# Patient Record
Sex: Male | Born: 1950 | Race: Black or African American | Hispanic: No | Marital: Married | Smoking: Never smoker
Health system: Southern US, Community
[De-identification: ages and names within clinical notes are randomized; demographics above are authoritative.]

## PROBLEM LIST (undated history)

## (undated) DIAGNOSIS — C801 Malignant (primary) neoplasm, unspecified: Secondary | ICD-10-CM

## (undated) DIAGNOSIS — E119 Type 2 diabetes mellitus without complications: Secondary | ICD-10-CM

## (undated) DIAGNOSIS — E291 Testicular hypofunction: Secondary | ICD-10-CM

## (undated) DIAGNOSIS — I10 Essential (primary) hypertension: Secondary | ICD-10-CM

## (undated) DIAGNOSIS — M109 Gout, unspecified: Secondary | ICD-10-CM

## (undated) DIAGNOSIS — Z9289 Personal history of other medical treatment: Secondary | ICD-10-CM

## (undated) DIAGNOSIS — Z860100 Personal history of colon polyps, unspecified: Secondary | ICD-10-CM

## (undated) DIAGNOSIS — J45909 Unspecified asthma, uncomplicated: Secondary | ICD-10-CM

## (undated) DIAGNOSIS — K5730 Diverticulosis of large intestine without perforation or abscess without bleeding: Secondary | ICD-10-CM

## (undated) DIAGNOSIS — H269 Unspecified cataract: Secondary | ICD-10-CM

## (undated) DIAGNOSIS — E785 Hyperlipidemia, unspecified: Secondary | ICD-10-CM

## (undated) DIAGNOSIS — R351 Nocturia: Secondary | ICD-10-CM

## (undated) DIAGNOSIS — Z8546 Personal history of malignant neoplasm of prostate: Secondary | ICD-10-CM

## (undated) DIAGNOSIS — E782 Mixed hyperlipidemia: Secondary | ICD-10-CM

## (undated) DIAGNOSIS — N529 Male erectile dysfunction, unspecified: Secondary | ICD-10-CM

## (undated) DIAGNOSIS — Z87442 Personal history of urinary calculi: Secondary | ICD-10-CM

## (undated) DIAGNOSIS — T7840XA Allergy, unspecified, initial encounter: Secondary | ICD-10-CM

## (undated) HISTORY — DX: Allergy, unspecified, initial encounter: T78.40XA

## (undated) HISTORY — DX: Testicular hypofunction: E29.1

## (undated) HISTORY — PX: OTHER SURGICAL HISTORY: SHX169

## (undated) HISTORY — DX: Hyperlipidemia, unspecified: E78.5

## (undated) HISTORY — PX: COLONOSCOPY: SHX174

## (undated) HISTORY — DX: Essential (primary) hypertension: I10

## (undated) HISTORY — DX: Unspecified cataract: H26.9

## (undated) HISTORY — PX: PERCUTANEOUS NEPHROLITHOTRIPSY: SHX2206

## (undated) HISTORY — DX: Malignant (primary) neoplasm, unspecified: C80.1

## (undated) HISTORY — PX: FINGER AMPUTATION: SHX636

---

## 1987-04-20 HISTORY — PX: ACHILLES TENDON REPAIR: SUR1153

## 1997-10-20 ENCOUNTER — Emergency Department (HOSPITAL_COMMUNITY): Admission: EM | Admit: 1997-10-20 | Discharge: 1997-10-20 | Payer: Self-pay | Admitting: Emergency Medicine

## 1997-11-14 ENCOUNTER — Ambulatory Visit (HOSPITAL_COMMUNITY): Admission: RE | Admit: 1997-11-14 | Discharge: 1997-11-14 | Payer: Self-pay | Admitting: Urology

## 2000-04-19 HISTORY — PX: URETEROLITHOTOMY: SHX71

## 2004-09-21 ENCOUNTER — Ambulatory Visit (HOSPITAL_COMMUNITY): Admission: RE | Admit: 2004-09-21 | Discharge: 2004-09-21 | Payer: Self-pay | Admitting: Internal Medicine

## 2006-05-11 ENCOUNTER — Ambulatory Visit (HOSPITAL_COMMUNITY): Admission: RE | Admit: 2006-05-11 | Discharge: 2006-05-12 | Payer: Self-pay | Admitting: Orthopedic Surgery

## 2006-05-11 HISTORY — PX: SHOULDER OPEN ROTATOR CUFF REPAIR: SHX2407

## 2006-06-08 ENCOUNTER — Encounter: Admission: RE | Admit: 2006-06-08 | Discharge: 2006-07-22 | Payer: Self-pay | Admitting: Orthopedic Surgery

## 2007-01-23 ENCOUNTER — Ambulatory Visit: Admission: RE | Admit: 2007-01-23 | Discharge: 2007-03-09 | Payer: Self-pay | Admitting: Radiation Oncology

## 2007-03-20 HISTORY — PX: ROBOT ASSISTED LAPAROSCOPIC RADICAL PROSTATECTOMY: SHX5141

## 2007-08-09 ENCOUNTER — Ambulatory Visit (HOSPITAL_COMMUNITY): Admission: RE | Admit: 2007-08-09 | Discharge: 2007-08-09 | Payer: Self-pay | Admitting: Internal Medicine

## 2009-03-19 ENCOUNTER — Ambulatory Visit (HOSPITAL_COMMUNITY): Admission: RE | Admit: 2009-03-19 | Discharge: 2009-03-19 | Payer: Self-pay | Admitting: Internal Medicine

## 2009-04-04 ENCOUNTER — Inpatient Hospital Stay (HOSPITAL_COMMUNITY): Admission: RE | Admit: 2009-04-04 | Discharge: 2009-04-05 | Payer: Self-pay | Admitting: Urology

## 2009-04-15 ENCOUNTER — Ambulatory Visit (HOSPITAL_COMMUNITY): Admission: RE | Admit: 2009-04-15 | Discharge: 2009-04-15 | Payer: Self-pay | Admitting: Urology

## 2009-09-17 ENCOUNTER — Ambulatory Visit (HOSPITAL_COMMUNITY): Admission: RE | Admit: 2009-09-17 | Discharge: 2009-09-17 | Payer: Self-pay | Admitting: Internal Medicine

## 2010-03-17 ENCOUNTER — Encounter: Payer: Self-pay | Admitting: Gastroenterology

## 2010-07-20 LAB — COMPREHENSIVE METABOLIC PANEL
ALT: 25 U/L (ref 0–53)
Albumin: 3.5 g/dL (ref 3.5–5.2)
Alkaline Phosphatase: 113 U/L (ref 39–117)
CO2: 30 mEq/L (ref 19–32)
Calcium: 9.4 mg/dL (ref 8.4–10.5)
Creatinine, Ser: 1.92 mg/dL — ABNORMAL HIGH (ref 0.4–1.5)
GFR calc non Af Amer: 36 mL/min — ABNORMAL LOW (ref >60)
Glucose, Bld: 229 mg/dL — ABNORMAL HIGH (ref 70–99)
Total Bilirubin: 0.4 mg/dL (ref 0.3–1.2)
Total Protein: 8.7 g/dL — ABNORMAL HIGH (ref 6.0–8.3)

## 2010-07-20 LAB — URINALYSIS, ROUTINE W REFLEX MICROSCOPIC
Bilirubin Urine: NEGATIVE
Glucose, UA: NEGATIVE mg/dL
Hgb urine dipstick: NEGATIVE
Ketones, ur: NEGATIVE mg/dL
Nitrite: NEGATIVE
Protein, ur: NEGATIVE mg/dL
Specific Gravity, Urine: 1.022 (ref 1.005–1.030)
Urobilinogen, UA: 1 mg/dL (ref 0.0–1.0)
pH: 6.5 (ref 5.0–8.0)

## 2010-07-20 LAB — BASIC METABOLIC PANEL
BUN: 19 mg/dL (ref 6–23)
CO2: 29 mEq/L (ref 19–32)
Calcium: 9.5 mg/dL (ref 8.4–10.5)
Creatinine, Ser: 1.46 mg/dL (ref 0.4–1.5)
Creatinine, Ser: 1.69 mg/dL — ABNORMAL HIGH (ref 0.4–1.5)
GFR calc Af Amer: 47 mL/min — ABNORMAL LOW (ref >60)
GFR calc Af Amer: 60 mL/min — ABNORMAL LOW (ref >60)
GFR calc non Af Amer: 39 mL/min — ABNORMAL LOW (ref >60)
GFR calc non Af Amer: 49 mL/min — ABNORMAL LOW (ref >60)
Glucose, Bld: 113 mg/dL — ABNORMAL HIGH (ref 70–99)
Glucose, Bld: 144 mg/dL — ABNORMAL HIGH (ref 70–99)
Potassium: 4.1 mEq/L (ref 3.5–5.1)
Potassium: 4.2 mEq/L (ref 3.5–5.1)
Sodium: 140 mEq/L (ref 135–145)

## 2010-07-20 LAB — CBC
HCT: 43.1 % (ref 39.0–52.0)
Hemoglobin: 12.1 g/dL — ABNORMAL LOW (ref 13.0–17.0)
MCV: 89.6 fL (ref 78.0–100.0)
MCV: 90 fL (ref 78.0–100.0)
Platelets: 442 10*3/uL — ABNORMAL HIGH (ref 150–400)
RBC: 4.73 MIL/uL (ref 4.22–5.81)
RBC: 4.79 MIL/uL (ref 4.22–5.81)
RDW: 12.9 % (ref 11.5–15.5)
RDW: 13 % (ref 11.5–15.5)
WBC: 11 10*3/uL — ABNORMAL HIGH (ref 4.0–10.5)

## 2010-07-20 LAB — PROTIME-INR: INR: 1.07 (ref 0.00–1.49)

## 2010-07-20 LAB — URINE CULTURE: Special Requests: NEGATIVE

## 2010-09-04 NOTE — Op Note (Signed)
NAMEWESTYN, DRIGGERS              ACCOUNT NO.:  [id number]   MEDICAL RECORD NO.:  [id number]          PATIENT TYPE:  AMB   LOCATION:  DAY                          FACILITY:  [facility]   PHYSICIAN:  Georges Lynch. Gioffre, M.D.DATE OF BIRTH:  [birth year]   DATE OF PROCEDURE:  05/11/2006  DATE OF DISCHARGE:                               OPERATIVE REPORT   SURGEON:  Dr. Georges Lynch. Gioffre   ASSISTANT:  Arlyn Leak, PA.   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   POSTOPERATIVE DIAGNOSIS:  Complete partial retracted tear of the rotator  cuff tendon right shoulder.   OPERATION:  Repair of the rotator cuff tendon right shoulder by primary  repair first utilizing three Mitek anchor sutures, followed by  utilization of a Restore tendon graft.   PROCEDURE:  Under general anesthesia, routine orthopedic prep and drape  of the right upper extremity was carried out.  Prior to general  anesthetic in the holding area he had an interscalene nerve block.  He  had 1 gram of IV Ancef.  At this time incision was made over the  anterior aspect of right shoulder.  Bleeders identified and cauterized.  I separated deltoid tendon by sharp dissection from the acromion.  I  split the very proximal part of the deltoid muscle.  I inserted the  Bennett retractor and did an acromionectomy utilizing the oscillating  saw.  Note he had DID NOT HAVE an os acromiale.  After doing the  excision of the distal acromion I then utilized the bur to even out the  undersurface of acromion.  Once we reestablished the subacromial space,  I excised the subdeltoid bursa.  I then utilized a Kocher clamp to  advance the tendon forward.  Prior to doing that I burred the lateral  articular surface of the humerus.  I then inserted three Mitek suture  anchors, then repaired the tendon primarily.  Following that I then  utilized a Restore tendon graft.  I utilized two additional  Mitek  sutures distal to the proximal row and sutured the graft down in placed  in usual fashion.  I then irrigated the wound, inserted some thrombin-  soaked Gelfoam.  I reapproximated deltoid tendon muscle in the usual  fashion.  The main part of the wound was closed in the usual fashion.  A  sterile Neosporin dressing was applied and he was placed in a shoulder  immobilizer.           ______________________________  Georges Lynch Darrelyn Hillock, M.D.     RAG/MEDQ  D:  05/11/2006  T:  05/11/2006  Job:  [id number]

## 2010-10-12 NOTE — Letter (Signed)
Summary: Colonoscopy Letter  [facility]  [address]   [place], [place] [address]   Phone: [phone]  Fax: [phone]      March 17, 2010 MRN: [id number]   Cody Zamora [address] [place], [place]  [address]   Dear Mr. FICKLE,   According to your medical record, it is time for you to schedule a Colonoscopy. The American Cancer Society recommends this procedure as a method to detect early colon cancer. Patients with a family history of colon cancer, or a personal history of colon polyps or inflammatory bowel disease are at increased risk.  This letter has been generated based on the recommendations made at the time of your procedure. If you feel that in your particular situation this may no longer apply, please contact our office.  Please call our office at [phone] to schedule this appointment or to update your records at your earliest convenience.  Thank you for cooperating with [place] to provide you with the very best care possible.   Sincerely,   Barbette Hair. Arlyce Dice, M.D.  [facility] [phone]

## 2010-10-15 ENCOUNTER — Ambulatory Visit (AMBULATORY_SURGERY_CENTER): Admitting: *Deleted

## 2010-10-15 VITALS — Ht 65.0 in | Wt 190.0 lb

## 2010-10-15 DIAGNOSIS — Z1211 Encounter for screening for malignant neoplasm of colon: Secondary | ICD-10-CM

## 2010-10-15 MED ORDER — PEG-KCL-NACL-NASULF-NA ASC-C 100 G PO SOLR: ORAL | Status: DC

## 2010-10-28 ENCOUNTER — Encounter: Payer: Self-pay | Admitting: Gastroenterology

## 2010-10-28 ENCOUNTER — Ambulatory Visit (AMBULATORY_SURGERY_CENTER): Admitting: Gastroenterology

## 2010-10-28 DIAGNOSIS — D126 Benign neoplasm of colon, unspecified: Secondary | ICD-10-CM

## 2010-10-28 DIAGNOSIS — Z1211 Encounter for screening for malignant neoplasm of colon: Secondary | ICD-10-CM

## 2010-10-28 DIAGNOSIS — K5289 Other specified noninfective gastroenteritis and colitis: Secondary | ICD-10-CM

## 2010-10-28 DIAGNOSIS — K5730 Diverticulosis of large intestine without perforation or abscess without bleeding: Secondary | ICD-10-CM

## 2010-10-28 MED ORDER — SODIUM CHLORIDE 0.9 % IV SOLN: 500.0000 mL | INTRAVENOUS | Status: DC

## 2010-10-28 NOTE — Patient Instructions (Addendum)
Diverticulosis Diverticulosis is a common condition that develops when small pouches (diverticula) form in the wall of the colon. The risk of diverticulosis increases with age. It happens more often in people who eat a low-fiber diet. Most individuals with diverticulosis have no symptoms. Those individuals with symptoms usually experience belly (abdominal) pain, constipation, or loose stools (diarrhea). HOME CARE INSTRUCTIONS  Increase the amount of fiber in your diet as directed by your caregiver or dietician. This may reduce symptoms of diverticulosis.   Your caregiver may recommend taking a dietary fiber supplement.   Drink at least 6 to 8 glasses of water each day to prevent constipation.   Try not to strain when you have a bowel movement.   Your caregiver may recommend avoiding nuts and seeds to prevent complications, although this is still an uncertain benefit.   Only take over-the-counter or prescription medicines for pain, discomfort, or fever as directed by your caregiver.  FOODS HAVING HIGH FIBER CONTENT INCLUDE:  Fruits. Apple, peach, pear, tangerine, raisins, prunes.   Vegetables. Brussels sprouts, asparagus, broccoli, cabbage, carrot, cauliflower, romaine lettuce, spinach, summer squash, tomato, winter squash, zucchini.   Starchy Vegetables. Baked beans, kidney beans, lima beans, split peas, lentils, potatoes (with skin).   Grains. Whole wheat bread, brown rice, bran flake cereal, plain oatmeal, white rice, shredded wheat, bran muffins.  SEEK IMMEDIATE MEDICAL CARE IF:  You develop increasing pain or severe bloating.   You have an oral temperature above 100, not controlled by medicine.   You develop vomiting or bowel movements that are bloody or black.  Document Released: 01/01/2004 Document Re-Released: 09/23/2009 [facility] Patient Information 2011 [place], [place].  Polyps, Colon  A polyp is extra tissue that grows inside your body. Colon polyps grow in the large  intestine. The large intestine, also called the colon, is part of your digestive system. It is a long, hollow tube at the end of your digestive tract where your body makes and stores stool. Most polyps are not dangerous. They are benign. This means they are not cancerous. But over time, some types of polyps can turn into cancer. Polyps that are smaller than a pea are usually not harmful. But larger polyps could someday become or may already be cancerous. To be safe, doctors remove all polyps and test them.  WHO GETS POLYPS? Anyone can get polyps, but certain people are more likely than others. You may have a greater chance of getting polyps if:  You are over 50.   You have had polyps before.   Someone in your family has had polyps.   Someone in your family has had cancer of the large intestine.   Find out if someone in your family has had polyps. You may also be more likely to get polyps if you:   Eat a lot of fatty foods   Smoke   Drink alcohol   Do not exercise  Eat too much  SYMPTOMS Most small polyps do not cause symptoms. People often do not know they have one until their caregiver finds it during a regular checkup or while testing them for something else. Some people do have symptoms like these:  Bleeding from the anus. You might notice blood on your underwear or on toilet paper after you have had a bowel movement.   Constipation or diarrhea that lasts more than a week.   Blood in the stool. Blood can make stool look black or it can show up as red streaks in the stool.  If  you have any of these symptoms, see your caregiver. HOW DOES THE DOCTOR TEST FOR POLYPS? The doctor can use four tests to check for polyps:  Digital rectal exam. The caregiver wears gloves and checks your rectum (the last part of the large intestine) to see if it feels normal. This test would find polyps only in the rectum. Your caregiver may need to do one of the other tests listed below to find polyps  higher up in the intestine.   Barium enema. The caregiver puts a liquid called barium into your rectum before taking x-rays of your large intestine. Barium makes your intestine look white in the pictures. Polyps are dark, so they are easy to see.   Sigmoidoscopy. With this test, the caregiver can see inside your large intestine. A thin flexible tube is placed into your rectum. The device is called a sigmoidoscope, which has a light and a tiny video camera in it. The caregiver uses the sigmoidoscope to look at the last third of your large intestine.   Colonoscopy. This test is like sigmoidoscopy, but the caregiver looks at all of the large intestine. It usually requires sedation. This is the most common method for finding and removing polyps.  TREATMENT  The caregiver will remove the polyp during sigmoidoscopy or colonoscopy. The polyp is then tested for cancer.   If you have had polyps, your caregiver may want you to get tested regularly in the future.  PREVENTION There is not one sure way to prevent polyps. You might be able to lower your risk of getting them if you:  Eat more fruits and vegetables and less fatty food.   Do not smoke.   Avoid alcohol.   Exercise every day.   Lose weight if you are overweight.   Eating more calcium and folate can also lower your risk of getting polyps. Some foods that are rich in calcium are milk, cheese, and broccoli. Some foods that are rich in folate are chickpeas, kidney beans, and spinach.   Aspirin might help prevent polyps. Studies are under way.  Document Released: 12/31/2003 Document Re-Released: 09/23/2009 [facility] Patient Information 2011 [place], [place].  FOLLOW DISCHARGE INSTRUCTIONS GIVEN TODAY. RESUME MEDICATIONS TODAY. YOU WILL RECEIVE RESULT LETTER IN YOUR MAIL IN 1-2 WEEKS. CALL us WITH ANY QUESTIONS.

## 2010-10-28 NOTE — Progress Notes (Signed)
Pt tolerated the colon exam very well. MAW 

## 2010-10-29 ENCOUNTER — Telehealth: Payer: Self-pay | Admitting: *Deleted

## 2010-10-29 NOTE — Telephone Encounter (Signed)
Id on answering machine, message left

## 2011-06-29 ENCOUNTER — Other Ambulatory Visit (HOSPITAL_COMMUNITY): Payer: Self-pay | Admitting: Physician Assistant

## 2011-06-29 ENCOUNTER — Ambulatory Visit (HOSPITAL_COMMUNITY)
Admission: RE | Admit: 2011-06-29 | Discharge: 2011-06-29 | Disposition: A | Discharge status: Home / Self Care | Source: Ambulatory Visit | Attending: Physician Assistant | Admitting: Physician Assistant

## 2011-06-29 DIAGNOSIS — I10 Essential (primary) hypertension: Secondary | ICD-10-CM | POA: Insufficient documentation

## 2011-06-29 DIAGNOSIS — R7611 Nonspecific reaction to tuberculin skin test without active tuberculosis: Secondary | ICD-10-CM | POA: Insufficient documentation

## 2012-06-06 ENCOUNTER — Other Ambulatory Visit: Payer: Self-pay | Admitting: Podiatry

## 2013-01-08 ENCOUNTER — Ambulatory Visit (HOSPITAL_COMMUNITY)
Admission: RE | Admit: 2013-01-08 | Discharge: 2013-01-08 | Disposition: A | Discharge status: Home / Self Care | Source: Ambulatory Visit | Attending: Internal Medicine | Admitting: Internal Medicine

## 2013-01-08 ENCOUNTER — Other Ambulatory Visit (HOSPITAL_COMMUNITY): Payer: Self-pay | Admitting: Internal Medicine

## 2013-01-08 DIAGNOSIS — M25532 Pain in left wrist: Secondary | ICD-10-CM

## 2013-01-08 DIAGNOSIS — M25539 Pain in unspecified wrist: Secondary | ICD-10-CM | POA: Insufficient documentation

## 2013-04-05 ENCOUNTER — Encounter: Payer: Self-pay | Admitting: Internal Medicine

## 2013-04-06 ENCOUNTER — Encounter: Payer: Self-pay | Admitting: Internal Medicine

## 2013-04-06 ENCOUNTER — Ambulatory Visit (INDEPENDENT_AMBULATORY_CARE_PROVIDER_SITE_OTHER): Admitting: Internal Medicine

## 2013-04-06 VITALS — BP 136/82 | HR 60 | Temp 98.2°F | Resp 18 | Wt 193.2 lb

## 2013-04-06 DIAGNOSIS — Z79899 Other long term (current) drug therapy: Secondary | ICD-10-CM

## 2013-04-06 DIAGNOSIS — I10 Essential (primary) hypertension: Secondary | ICD-10-CM

## 2013-04-06 DIAGNOSIS — E1169 Type 2 diabetes mellitus with other specified complication: Secondary | ICD-10-CM | POA: Insufficient documentation

## 2013-04-06 DIAGNOSIS — N200 Calculus of kidney: Secondary | ICD-10-CM | POA: Insufficient documentation

## 2013-04-06 DIAGNOSIS — Z8546 Personal history of malignant neoplasm of prostate: Secondary | ICD-10-CM | POA: Insufficient documentation

## 2013-04-06 DIAGNOSIS — R7309 Other abnormal glucose: Secondary | ICD-10-CM

## 2013-04-06 DIAGNOSIS — E559 Vitamin D deficiency, unspecified: Secondary | ICD-10-CM | POA: Insufficient documentation

## 2013-04-06 DIAGNOSIS — E782 Mixed hyperlipidemia: Secondary | ICD-10-CM

## 2013-04-06 DIAGNOSIS — M79609 Pain in unspecified limb: Secondary | ICD-10-CM

## 2013-04-06 LAB — HEMOGLOBIN A1C
Hgb A1c MFr Bld: 6.4 % — ABNORMAL HIGH (ref <5.7)
Mean Plasma Glucose: 137 mg/dL — ABNORMAL HIGH (ref <117)

## 2013-04-06 LAB — HEPATIC FUNCTION PANEL
ALT: 20 U/L (ref 0–53)
Albumin: 4.6 g/dL (ref 3.5–5.2)
Indirect Bilirubin: 0.3 mg/dL (ref 0.0–0.9)
Total Bilirubin: 0.4 mg/dL (ref 0.3–1.2)
Total Protein: 8 g/dL (ref 6.0–8.3)

## 2013-04-06 LAB — CBC WITH DIFFERENTIAL/PLATELET
Basophils Relative: 0 % (ref 0–1)
Eosinophils Absolute: 0.2 10*3/uL (ref 0.0–0.7)
Eosinophils Relative: 2 % (ref 0–5)
HCT: 47.7 % (ref 39.0–52.0)
Hemoglobin: 16.6 g/dL (ref 13.0–17.0)
Lymphocytes Relative: 35 % (ref 12–46)
MCH: 29.9 pg (ref 26.0–34.0)
MCHC: 34.8 g/dL (ref 30.0–36.0)
MCV: 85.9 fL (ref 78.0–100.0)
Monocytes Absolute: 0.5 10*3/uL (ref 0.1–1.0)
Monocytes Relative: 6 % (ref 3–12)
Platelets: 302 10*3/uL (ref 150–400)
WBC: 8.7 10*3/uL (ref 4.0–10.5)

## 2013-04-06 LAB — BASIC METABOLIC PANEL WITH GFR
BUN: 14 mg/dL (ref 6–23)
Creat: 1.25 mg/dL (ref 0.50–1.35)
GFR, Est African American: 70 mL/min
GFR, Est Non African American: 60 mL/min
Glucose, Bld: 100 mg/dL — ABNORMAL HIGH (ref 70–99)
Potassium: 4.5 mEq/L (ref 3.5–5.3)

## 2013-04-06 LAB — LIPID PANEL
Cholesterol: 188 mg/dL (ref 0–200)
LDL Cholesterol: 128 mg/dL — ABNORMAL HIGH (ref 0–99)
Triglycerides: 156 mg/dL — ABNORMAL HIGH (ref <150)
VLDL: 31 mg/dL (ref 0–40)

## 2013-04-06 MED ORDER — EZETIMIBE 10 MG PO TABS: 10.0000 mg | ORAL_TABLET | Freq: Every day | ORAL | Status: DC

## 2013-04-06 NOTE — Progress Notes (Signed)
Patient ID: Cody Zamora, male   DOB: Jan 19, 1951, 62 y.o.   MRN: [id number]   This very nice 62 y.o.male presents for 3 month follow up with Hypertension, Hyperlipidemia, Pre-Diabetes and Vitamin D Deficiency.    HTN predates from 23.BP has been controlled at home. Today's BP: 136/82 mmHg . Patient denies any cardiac type chest pain, palpitations, dyspnea/orthopnea/PND, dizziness, claudication, or dependent edema.   Hyperlipidemia is not controlled with diet and his Atorvastatin was d/c'd because or elevated CPK 623 and he is currently diet complimented with Benifiber. Last Cholesterol was 202, Triglycerides were 233, HDL 28 and LDL 129. Patient denies myalgias or other med SE's.    Also, the patient has history of PreDiabetes/insulin resistance since August 2011 with A1c 6.5% and with last A1c of 6.5% in September. Patient admits poor dietary habits and need for weight loss. Patient denies any symptoms of reactive hypoglycemia, diabetic polys, paresthesias or visual blurring.   Further, Patient has history of Vitamin D Deficiency with last vitamin D of 51 in September. Patient supplements vitamin D without any suspected side-effects.  Medication Sig Dispense Refill  . aspirin 81 MG tablet Take 81 mg by mouth daily.        . Cholecalciferol (VITAMIN D3) 2000 UNITS capsule Take 6,000 Units by mouth daily.      . fluticasone (FLONASE) 50 MCG/ACT nasal spray Place 1 spray into both nostrils daily.      . Multiple Vitamins-Minerals (MULTIVITAMIN WITH MINERALS) tablet Take 1 tablet by mouth daily.        . valsartan (DIOVAN) 320 MG tablet Take 320 mg by mouth daily.           Allergies  Allergen Reactions  . Cialis [Tadalafil] Other (See Comments)    Hot flashes  . Fructose Nausea And Vomiting  . Milk-Related Compounds   . Morphine And Related Itching  . Ppd [Tuberculin Purified Protein Derivative]     Positive 09-2004    PMHx:   Past Medical History  Diagnosis Date  . Allergy    fructose  . Cancer     prostate  . Hyperlipidemia   . Hypertension   . Chronic kidney disease     huge kidney stones  . Pre-diabetes   . Hypogonadism male     FHx:    Reviewed / unchanged  SHx:    Reviewed / unchanged  Systems Review: Constitutional: Denies fever, chills, wt changes, headaches, insomnia, fatigue, night sweats, change in appetite. Eyes: Denies redness, blurred vision, diplopia, discharge, itchy, watery eyes.  ENT: Denies discharge, congestion, post nasal drip, epistaxis, sore throat, earache, hearing loss, dental pain, tinnitus, vertigo, sinus pain, snoring.  CV: Denies chest pain, palpitations, irregular heartbeat, syncope, dyspnea, diaphoresis, orthopnea, PND, claudication, edema. Respiratory: denies cough, dyspnea, DOE, pleurisy, hoarseness, laryngitis, wheezing.  Gastrointestinal: Denies dysphagia, odynophagia, heartburn, reflux, water brash, abdominal pain or cramps, nausea, vomiting, bloating, diarrhea, constipation, hematemesis, melena, hematochezia,  or hemorrhoids. Genitourinary: Denies dysuria, frequency, urgency, nocturia, hesitancy, discharge, hematuria, flank pain. Musculoskeletal: Denies arthralgias, myalgias, stiffness, jt. swelling, pain, limp, strain/sprain.  Skin: Denies pruritus, rash, hives, warts, acne, eczema, change in skin lesion(s). Neuro: No weakness, tremor, incoordination, spasms, paresthesia, or pain. Psychiatric: Denies confusion, memory loss, or sensory loss. Endo: Denies change in weight, skin, hair change.  Heme/Lymph: No excessive bleeding, bruising, orenlarged lymph nodes.  BP: 136/82  Pulse: 60  Temp: 98.2 F (36.8 C)  Resp: 18    BMI  32.15 kg/(m^2)  Height    5\' 5"      Weight  193 lb 3.2 oz  On Exam:  Appears well nourished - in no distress. Eyes: PERRLA, EOMs, conjunctiva no swelling or erythema. Sinuses: No frontal/maxillary tenderness ENT/Mouth: EAC's clear, TM's nl w/o erythema, bulging. Nares clear w/o  erythema, swelling, exudates. Oropharynx clear without erythema or exudates. Oral hygiene is good. Tongue normal, non obstructing. Hearing intact.  Neck: Supple. Thyroid nl. Car 2+/2+ without bruits, nodes or JVD. Chest: Respirations nl with BS clear & equal w/o rales, rhonchi, wheezing or stridor.  Cor: Heart sounds normal w/ regular rate and rhythm without sig. murmurs, gallops, clicks, or rubs. Peripheral pulses normal and equal  without edema.  Abdomen: Soft & bowel sounds normal. Non-tender w/o guarding, rebound, hernias, masses, or organomegaly.  Lymphatics: Unremarkable.  Musculoskeletal: Full ROM all peripheral extremities, joint stability, 5/5 strength, and normal gait.  Skin: Warm, dry without exposed rashes, lesions, ecchymosis apparent.  Neuro: Cranial nerves intact, reflexes equal bilaterally. Sensory-motor testing grossly intact. Tendon reflexes grossly intact.  Pysch: Alert & oriented x 3. Insight and judgement nl & appropriate. No ideations.  Assessment and Plan:  1. Hypertension - Continue monitor blood pressure at home. Continue diet/meds same.  2. Hyperlipidemia - Continue diet/meds, exercise,& lifestyle modifications. Continue monitor periodic cholesterol/liver & renal functions. Given Rx and coupons for Zetia in anticipation of Benifiber insuffficiency  3. Pre-diabetes - Continue diet, weight loss, exercise, lifestyle modifications. Monitor appropriate labs.  4. Vitamin D Deficiency - Continue supplementation.  5. Prostate Cancer, Hx/o  6. Nephrolithiasis, Hx/o  Recommended regular exercise, BP monitoring, weight control, and discussed med and SE's. Recommended labs to assess and monitor clinical status. Further disposition pending results of labs.

## 2013-04-06 NOTE — Patient Instructions (Signed)

## 2013-04-23 ENCOUNTER — Other Ambulatory Visit: Payer: Self-pay | Admitting: Internal Medicine

## 2013-04-23 DIAGNOSIS — E782 Mixed hyperlipidemia: Secondary | ICD-10-CM

## 2013-04-23 MED ORDER — EZETIMIBE 10 MG PO TABS: 10.0000 mg | ORAL_TABLET | Freq: Every day | ORAL | Status: DC

## 2013-04-27 ENCOUNTER — Encounter: Payer: Self-pay | Admitting: Physician Assistant

## 2013-04-27 ENCOUNTER — Ambulatory Visit (INDEPENDENT_AMBULATORY_CARE_PROVIDER_SITE_OTHER): Admitting: Physician Assistant

## 2013-04-27 VITALS — BP 128/72 | HR 68 | Temp 98.6°F | Resp 16 | Wt 196.0 lb

## 2013-04-27 DIAGNOSIS — E119 Type 2 diabetes mellitus without complications: Secondary | ICD-10-CM

## 2013-04-27 MED ORDER — METFORMIN HCL ER 500 MG PO TB24: ORAL_TABLET | ORAL | Status: DC

## 2013-04-27 NOTE — Patient Instructions (Signed)
We are starting you on Metformin to prevent or treat diabetes. Metformin does not cause low blood sugars. In order to create energy your cells need insulin and sugar but sometime your cells do not accept the insulin and this can cause increased sugars and decreased energy. The Metformin helps your cells accept insulin and the sugar to give you more energy.   The two most common side effects are nausea and diarrhea, follow these rules to avoid it! You can take imodium per box instructions when starting metformin if needed.   Rules of metformin: 1) start out slow with only one pill daily. Our goal for you is 4 pills a day or 2000mg  total.  2) take with your largest meal. 3) Take with least amount of carbs.   Call if you have any problems.     Bad carbs also include fruit juice, alcohol, and sweet tea. These are empty calories that do not signal to your brain that you are full.   Please remember the good carbs are still carbs which convert into sugar. So please measure them out no more than 1/2-1 cup of rice, oatmeal, pasta, and beans.  Veggies are however free foods! Pile them on.   I like lean protein at every meal such as chicken, [place], pork chops, cottage cheese, etc. Just do not fry these meats and please center your meal around vegetable, the meats should be a side dish.   No all fruit is created equal. Please see the list below, the fruit at the bottom is higher in sugars than the fruit at the top   Remember exercise is great for your cardiovascular health and can help with weight loss but YOU CAN NOT OUT RUN YOUR FORK!    Type 2 Diabetes Mellitus, Adult Type 2 diabetes mellitus is a long-term (chronic) disease. In type 2 diabetes: The pancreas does not make enough of a hormone called insulin. The cells in the body do not respond as well to the insulin that is made. Both of the above can happen. Normally, insulin moves sugars from food into tissue cells. This gives you energy. If  you have type 2 diabetes, sugars cannot be moved into tissue cells. This causes high blood sugar (hyperglycemia).  HOME CARE Have your hemoglobin A1c level checked twice a year. The level shows if your diabetes is under control or out of control. Perform daily blood sugar testing as told by your doctor. Check your keytone levels by testing your pee (urine) when you are sick and as told. Take your diabetes or insulin medicine as told by your doctor. Never run out of insulin. Adjust how much insulin you give yourself based on how many carbs (carbohydrates) you eat. Carbs are in many foods, such as fruits, vegetables, whole grains, and dairy products. Have a healthy snack between every healthy meal. Have 3 meals and 3 snacks a day. Lose weight if you are overweight. Carry a medical alert card or wear your medical alert jewelry. Carry a 15 gram carb snack with you at all times. Examples include: Glucose pills, 3 or 4. Glucose gel, 15 gram tube. Raisins, 2 tablespoons (24 grams). Jelly beans, 6. Animal crackers, 8. Sugar pop, 4 ounces (120 milliliters). Gummy treats, 9. Notice low blood sugar (hypoglycemia) symptoms, such as: Shaking (tremors). Decreased ability to think clearly. Sweating. Increased heart rate. Headache. Dry mouth. Hunger. Crabbiness (irritability). Being worried or tense (anxiety). Restless sleep. A change in speech or coordination. Confusion. Treat low blood sugar  right away. If you are alert and can swallow, follow the 15:15 rule: Take 15 20 grams of a rapid-acting glucose or carb. This includes glucose gel, glucose pills, or 4 ounces (120 milliters) of fruit juice, regular pop, or low-fat milk. Check your blood sugar level after taking the glucose. Take 15 20 grams of more glucose if the repeat blood sugar level is still 70 mg/dL (milligrams/deciliter) or below. Eat a meal or snack within 1 hour of the blood sugar levels going back to normal. Notice early symptoms  of high blood sugar, such as: Being really thirsty or drinking a lot (polydipsia). Peeing (urinating) a lot (polyuria). Do at least 150 minutes of physical activitity a week or as told. Split the 150 minutes of activity up during the week. Do not do 150 minutes of activity in one day. Perform exercises, such as weight lifting, at least 2 times a week or as told. Adjust your insulin or food intake as needed if you start a new exercise or sport. Follow your sick day plan when you are not able to eat or drink as usual. Avoid tobacco use. Women who are not pregnant should drink no more than 1 drink a day. Men should drink no more than 2 drinks a day. Only drink alcohol with food. Ask your doctor if alcohol is safe for you. Tell your doctor if you drink alcohol several times during the week. See your doctor regularly. Schedule an eye exam soon after you are diagnosed with diabetes. Schedule exams once every year. Check your skin and feet every day. Check for cuts, bruises, redness, nail problems, bleeding, blisters, or sores. A doctor should do a foot exam once a year. Brush your teeth and gums twice a day. Floss once a day. Visit your dentist regularly. Share your diabetes plan with your workplace or school. Stay up-to-date with shots that fight against diseases (immunizations). Learn how to manage stress. Get diabetes education and support as needed. Ask your doctor for special help if: You need help to maintain or improve how you to do things on your own. You need help to maintain or improve the quality of your life. You have foot or hand problems. You have trouble cleaning yourself, dressing, eating, or doing physical activity. GET HELP RIGHT AWAY IF: You have trouble breathing. You have moderate to large ketone levels. You are unable to eat food or drink fluids for more than 6 hours. You feel sick to your stomach (nauseous) or throw up (vomit) for more than 6 hours. Your blood sugar  level is over 240 mg/dL. There is a change in mental status. You get another serious illness. You have watery poop (diarrhea) for more than 6 hours. You have been sick or have had a fever for 2 or more days and are not getting better. You have pain when you are physically active. MAKE SURE YOU: Understand these instructions. Will watch your condition. Will get help right away if you are not doing well or get worse. Document Released: 01/13/2008 Document Revised: 12/29/2011 Document Reviewed: 08/04/2012 [facility] Patient Information 2014 [place], [place].

## 2013-04-27 NOTE — Progress Notes (Signed)
SUBJECTIVE: 63 y.o. male for follow up of possible diabetes. His last 2 A1C in the office were 6.5 but for his recent physical is was: Lab Results  Component Value Date   HGBA1C 6.4* 04/06/2013   Diabetic Review of Systems - He used his wife glucometer and his sugar fasting was 250 further diabetic ROS: no chest pain, dyspnea or TIA's, no numbness, tingling or pain in extremities, has noted excessive thirstiness and frequent urination, weight has increased, blurry vision, last eye exam approximately one month ago. Last eye exam 10 years ago.   Current Outpatient Prescriptions  Medication Sig Dispense Refill  . aspirin 81 MG tablet Take 81 mg by mouth daily.        . Cholecalciferol (VITAMIN D3) 2000 UNITS capsule Take 6,000 Units by mouth daily.      Marland Kitchen ezetimibe (ZETIA) 10 MG tablet Take 1 tablet (10 mg total) by mouth daily.  30 tablet  99  . fluticasone (FLONASE) 50 MCG/ACT nasal spray Place 1 spray into both nostrils daily.      . Multiple Vitamins-Minerals (MULTIVITAMIN WITH MINERALS) tablet Take 1 tablet by mouth daily.        . Omega-3 Fatty Acids (FISH OIL PO) Take by mouth daily.      . valsartan (DIOVAN) 320 MG tablet Take 320 mg by mouth daily.        . vardenafil (LEVITRA) 20 MG tablet Take 20 mg by mouth daily as needed for erectile dysfunction.      . Wheat Dextrin (BENEFIBER DRINK MIX PO) Take by mouth. Drinks 1 packet daily       No current facility-administered medications for this visit.    OBJECTIVE: Appearance: alert, well appearing, and in no distress. BP 128/72  Pulse 68  Temp(Src) 98.6 F (37 C)  Resp 16  Wt 196 lb (88.905 kg) Fundi: fundi poorly visualized and possible increased vascularity Cardio: heart sounds normal rate, regular rhythm, normal S1, S2, no murmurs, rubs, clicks or gallops Lung: chest clear AB:  no hepatosplenomegaly Neck: supple, no carotid bruits Feet: feet: warm, good capillary refill and dry cracking heels   ASSESSMENT: Diabetes  Mellitus: needs improvement  PLAN: Metformin 500 ER 4x daily, rules given Continue diovan for kidney and heart protection Issues reviewed with him: diabetic diet discussed in detail, written exchange diet given, low cholesterol diet, weight control and daily exercise discussed, home glucose monitoring emphasized, home glucose monitoring demonstrated and taught, glucose meter dispensed to patient, all medications, side effects and compliance discussed carefully, foot care discussed and Podiatry visits discussed, annual eye examinations at Ophthalmology discussed, glycohemoglobin and other lab monitoring discussed and long term diabetic complications discussed.

## 2013-05-08 ENCOUNTER — Other Ambulatory Visit: Payer: Self-pay | Admitting: Physician Assistant

## 2013-05-08 DIAGNOSIS — E782 Mixed hyperlipidemia: Secondary | ICD-10-CM

## 2013-05-08 MED ORDER — EZETIMIBE 10 MG PO TABS: 10.0000 mg | ORAL_TABLET | Freq: Every day | ORAL | Status: DC

## 2013-05-08 MED ORDER — VARDENAFIL HCL 20 MG PO TABS: 20.0000 mg | ORAL_TABLET | Freq: Every day | ORAL | Status: DC | PRN

## 2013-05-08 MED ORDER — METFORMIN HCL ER 500 MG PO TB24: ORAL_TABLET | ORAL | Status: DC

## 2013-05-29 ENCOUNTER — Ambulatory Visit: Payer: Self-pay | Admitting: Physician Assistant

## 2013-05-29 ENCOUNTER — Encounter: Payer: Self-pay | Admitting: Physician Assistant

## 2013-05-29 ENCOUNTER — Ambulatory Visit (INDEPENDENT_AMBULATORY_CARE_PROVIDER_SITE_OTHER): Admitting: Physician Assistant

## 2013-05-29 VITALS — BP 110/70 | HR 76 | Temp 97.9°F | Resp 16 | Wt 193.0 lb

## 2013-05-29 DIAGNOSIS — I10 Essential (primary) hypertension: Secondary | ICD-10-CM

## 2013-05-29 DIAGNOSIS — R5383 Other fatigue: Secondary | ICD-10-CM

## 2013-05-29 DIAGNOSIS — R5381 Other malaise: Secondary | ICD-10-CM

## 2013-05-29 DIAGNOSIS — Z125 Encounter for screening for malignant neoplasm of prostate: Secondary | ICD-10-CM

## 2013-05-29 DIAGNOSIS — E782 Mixed hyperlipidemia: Secondary | ICD-10-CM

## 2013-05-29 DIAGNOSIS — E119 Type 2 diabetes mellitus without complications: Secondary | ICD-10-CM

## 2013-05-29 DIAGNOSIS — C61 Malignant neoplasm of prostate: Secondary | ICD-10-CM

## 2013-05-29 LAB — CBC WITH DIFFERENTIAL/PLATELET
Basophils Absolute: 0 10*3/uL (ref 0.0–0.1)
Basophils Relative: 0 % (ref 0–1)
Eosinophils Absolute: 0.1 10*3/uL (ref 0.0–0.7)
Eosinophils Relative: 1 % (ref 0–5)
HCT: 44 % (ref 39.0–52.0)
Hemoglobin: 15.3 g/dL (ref 13.0–17.0)
LYMPHS ABS: 3.4 10*3/uL (ref 0.7–4.0)
Lymphocytes Relative: 42 % (ref 12–46)
MCH: 29.8 pg (ref 26.0–34.0)
MCHC: 34.8 g/dL (ref 30.0–36.0)
MCV: 85.8 fL (ref 78.0–100.0)
Monocytes Absolute: 0.3 10*3/uL (ref 0.1–1.0)
Monocytes Relative: 4 % (ref 3–12)
NEUTROS ABS: 4.1 10*3/uL (ref 1.7–7.7)
NEUTROS PCT: 53 % (ref 43–77)
Platelets: 258 10*3/uL (ref 150–400)
RBC: 5.13 MIL/uL (ref 4.22–5.81)
RDW: 14.6 % (ref 11.5–15.5)
WBC: 7.9 10*3/uL (ref 4.0–10.5)

## 2013-05-29 LAB — HEPATIC FUNCTION PANEL
ALT: 22 U/L (ref 0–53)
AST: 27 U/L (ref 0–37)
Albumin: 4.5 g/dL (ref 3.5–5.2)
Alkaline Phosphatase: 90 U/L (ref 39–117)
BILIRUBIN DIRECT: 0.1 mg/dL (ref 0.0–0.3)
Indirect Bilirubin: 0.4 mg/dL (ref 0.2–1.2)
Total Bilirubin: 0.5 mg/dL (ref 0.2–1.2)
Total Protein: 7.6 g/dL (ref 6.0–8.3)

## 2013-05-29 LAB — BASIC METABOLIC PANEL WITH GFR
BUN: 14 mg/dL (ref 6–23)
CALCIUM: 9.4 mg/dL (ref 8.4–10.5)
CO2: 27 mEq/L (ref 19–32)
Chloride: 104 mEq/L (ref 96–112)
Creat: 1.09 mg/dL (ref 0.50–1.35)
GFR, EST AFRICAN AMERICAN: 82 mL/min
GFR, Est Non African American: 71 mL/min
Glucose, Bld: 83 mg/dL (ref 70–99)
POTASSIUM: 4 meq/L (ref 3.5–5.3)
SODIUM: 139 meq/L (ref 135–145)

## 2013-05-29 LAB — IRON AND TIBC
%SAT: 23 % (ref 20–55)
Iron: 67 ug/dL (ref 42–165)
TIBC: 297 ug/dL (ref 215–435)
UIBC: 230 ug/dL (ref 125–400)

## 2013-05-29 NOTE — Progress Notes (Signed)
Subjective:     Cody Zamora is an 63 y.o. male who presents for follow up of diabetes. Current symptoms include: polydipsia. Patient denies foot ulcerations, hypoglycemia , paresthesia of the feet, polyuria and visual disturbances. Evaluation to date has included: fasting blood sugar, hemoglobin A1C and microalbuminuria. Home sugars: BGs range between 90 and 125. Current treatments: more intensive attention to diet which has been somewhat effective, increased aerobic exercise which has been somewhat effective and Added metformin which has been effective. He is on 2 metformin a day, he is riding his bike. Last dilated eye exam 05/29/2013. History of prostate cancer, see's Dr. Janice Norrie soon, wants PSA done today too.   The following portions of the patient's history were reviewed and updated as appropriate: allergies, current medications, past family history, past medical history, past social history, past surgical history and problem list.  Last DM labs: Lab Results  Component Value Date   HGBA1C 6.4* 04/06/2013   Lab Results  Component Value Date   LDLCALC 128* 04/06/2013   CREATININE 1.25 04/06/2013   Hyperlipidemia- was started on Zetia last visit.  Review of Systems A comprehensive review of systems was negative.    Objective:    BP 110/70  Pulse 76  Temp(Src) 97.9 F (36.6 C)  Resp 16  Wt 193 lb (87.544 kg)  General Appearance:    Alert, cooperative, no distress, appears stated age  Head:    Normocephalic, without obvious abnormality, atraumatic  Eyes:    PERRL, conjunctiva/corneas clear, EOM's intact, fundi    benign, both eyes       Ears:    Normal TM's and external ear canals, both ears  Nose:   Nares normal, septum midline, mucosa normal, no drainage    or sinus tenderness  Throat:   Lips, mucosa, and tongue normal; teeth and gums normal  Neck:   Supple, symmetrical, trachea midline, no adenopathy;       thyroid:  No enlargement/tenderness/nodules; no carotid   bruit or  JVD  Back:     Symmetric, no curvature, ROM normal, no CVA tenderness  Lungs:     Clear to auscultation bilaterally, respirations unlabored  Chest wall:    No tenderness or deformity  Heart:    Regular rate and rhythm, S1 and S2 normal, no murmur, rub   or gallop  Abdomen:     Soft, non-tender, bowel sounds active all four quadrants,    no masses, no organomegaly  Genitalia:    Normal male without lesion, discharge or tenderness  Rectal:    Normal tone, normal prostate, no masses or tenderness;   guaiac negative stool  Extremities:   Extremities normal, atraumatic, no cyanosis or edema  Pulses:   2+ and symmetric all extremities  Skin:   Skin color, texture, turgor normal, no rashes or lesions  Lymph nodes:   Cervical, supraclavicular, and axillary nodes normal  Neurologic:   CNII-XII intact. Normal strength, sensation and reflexes      throughout     Assessment:    Diabetes mellitus Type II, under fair control.  Fatigue Hyperlipidemia History of prostate cancer  Plan:    Discussed general issues about diabetes pathophysiology and management. Neurosurgeon distributed. Addressed ADA diet. Encouraged aerobic exercise. Discussed foot care. Increased dose of metformin; see  medication orders. Reminded to bring in blood sugar diary at next visit.   Fatigue History of normal sleep study Will check CBC, CMET, and fructosamine, B12, Iron.   Hyperlipidemia- continue zetia  Check PSA and send to Dr. Janice Norrie.   Follow up in March.

## 2013-05-29 NOTE — Patient Instructions (Signed)
We are starting you on Metformin to prevent or treat diabetes. Metformin does not cause low blood sugars. In order to create energy your cells need insulin and sugar but sometime your cells do not accept the insulin and this can cause increased sugars and decreased energy. The Metformin helps your cells accept insulin and the sugar to give you more energy.   The two most common side effects are nausea and diarrhea, follow these rules to avoid it! You can take imodium per box instructions when starting metformin if needed.   Rules of metformin: 1) start out slow with only one pill daily. Our goal for you is 4 pills a day or 2000mg  total.  2) take with your largest meal. 3) Take with least amount of carbs.   Call if you have any problems.     Bad carbs also include fruit juice, alcohol, and sweet tea. These are empty calories that do not signal to your brain that you are full.   Please remember the good carbs are still carbs which convert into sugar. So please measure them out no more than 1/2-1 cup of rice, oatmeal, pasta, and beans.  Veggies are however free foods! Pile them on.   I like lean protein at every meal such as chicken, [place], pork chops, cottage cheese, etc. Just do not fry these meats and please center your meal around vegetable, the meats should be a side dish.   No all fruit is created equal. Please see the list below, the fruit at the bottom is higher in sugars than the fruit at the top   Blood Glucose Monitoring, Adult Monitoring your blood glucose (also know as blood sugar) helps you to manage your diabetes. It also helps you and your health care provider monitor your diabetes and determine how well your treatment plan is working. WHY SHOULD YOU MONITOR YOUR BLOOD GLUCOSE?  It can help you understand how food, exercise, and medicine affect your blood glucose.  It allows you to know what your blood glucose is at any given moment. You can quickly tell if you are having  low blood glucose (hypoglycemia) or high blood glucose (hyperglycemia).  It can help you and your health care provider know how to adjust your medicines.  It can help you understand how to manage an illness or adjust medicine for exercise. WHEN SHOULD YOU TEST? Your health care provider will help you decide how often you should check your blood glucose. This may depend on the type of diabetes you have, your diabetes control, or the types of medicines you are taking. Be sure to write down all of your blood glucose readings so that this information can be reviewed with your health care provider. See below for examples of testing times that your health care provider may suggest. Type 1 Diabetes  Test 4 times a day if you are in good control, using an insulin pump, or perform multiple daily injections.  If your diabetes is not well-controlled or if you are sick, you may need to monitor more often.  It is a good idea to also monitor:  Before and after exercise.  Between meals and 2 hours after a meal.  Occasionally between 2:00 to 3:00 am. Type 2 Diabetes  It can vary with each person, but generally, if you are on insulin, test 4 times a day.  If you take medicines by mouth (orally), test 2 times a day.  If you are on a controlled diet, test once a  day.  If your diabetes is not well controlled or if you are sick, you may need to monitor more often. HOW TO MONITOR YOUR BLOOD GLUCOSE Supplies Needed  Blood glucose meter.  Test strips for your meter. Each meter has its own strips. You must use the strips that go with your own meter.  A pricking needle (lancet).  A device that holds the lancet (lancing device).  A journal or log book to write down your results. Procedure  Wash your hands with soap and water. Alcohol is not preferred.  Prick the side of your finger (not the tip) with the lancet.  Gently milk the finger until a small drop of blood appears.  Follow the  instructions that come with your meter for inserting the test strip, applying blood to the strip, and using your blood glucose meter. Other Areas to Get Blood for Testing Some meters allow you to use other areas of your body (other than your finger) to test your blood. These areas are called alternative sites. The most common alternative sites are:  The forearm.  The thigh.  The back area of the lower leg.  The palm of the hand. The blood flow in these areas is slower. Therefore, the blood glucose values you get may be delayed, and the numbers are different from what you would get from your fingers. Do not use alternative sites if you think you are having hypoglycemia. Your reading will not be accurate. Always use a finger if you are having hypoglycemia. Also, if you cannot feel your lows (hypoglycemia unawareness), always use your fingers for your blood glucose checks. ADDITIONAL TIPS FOR GLUCOSE MONITORING  Do not reuse lancets.  Always carry your supplies with you.  All blood glucose meters have a 24-hour "hotline" number to call if you have questions or need help.  Adjust (calibrate) your blood glucose meter with a control solution after finishing a few boxes of strips. BLOOD GLUCOSE RECORD KEEPING It is a good idea to keep a daily record or log of your blood glucose readings. Most glucose meters, if not all, keep your glucose records stored in the meter. Some meters come with the ability to download your records to your home computer. Keeping a record of your blood glucose readings is especially helpful if you are wanting to look for patterns. Make notes to go along with the blood glucose readings because you might forget what happened at that exact time. Keeping good records helps you and your health care provider to work together to achieve good diabetes management.  Document Released: 04/08/2003 Document Revised: 12/06/2012 Document Reviewed: 08/28/2012 [facility] Patient Information  2014 [place].

## 2013-05-30 ENCOUNTER — Telehealth: Payer: Self-pay

## 2013-05-30 LAB — FRUCTOSAMINE: Fructosamine: 242 umol/L (ref <285)

## 2013-05-30 LAB — PSA: PSA: <0.01 ng/mL (ref <=4.00)

## 2013-05-30 LAB — VITAMIN B12: Vitamin B-12: 462 pg/mL (ref 211–911)

## 2013-05-30 NOTE — Telephone Encounter (Signed)
Message copied by Nadyne Coombes on Wed May 30, 2013 12:06 PM ------      Message from: Vicie Mutters R      Created: Wed May 30, 2013  8:28 AM       All of your labs are good including PSA, fructosamine. Continue medications as you have been. Will send PSA to your urologist. ------

## 2013-05-30 NOTE — Telephone Encounter (Signed)
lmom to pt w/ lab results.

## 2013-07-05 ENCOUNTER — Encounter: Payer: Self-pay | Admitting: Emergency Medicine

## 2013-07-05 ENCOUNTER — Ambulatory Visit (INDEPENDENT_AMBULATORY_CARE_PROVIDER_SITE_OTHER): Admitting: Emergency Medicine

## 2013-07-05 VITALS — BP 124/70 | HR 66 | Temp 98.2°F | Resp 18 | Ht 66.0 in | Wt 190.0 lb

## 2013-07-05 DIAGNOSIS — Z79899 Other long term (current) drug therapy: Secondary | ICD-10-CM

## 2013-07-05 DIAGNOSIS — E559 Vitamin D deficiency, unspecified: Secondary | ICD-10-CM

## 2013-07-05 DIAGNOSIS — I10 Essential (primary) hypertension: Secondary | ICD-10-CM

## 2013-07-05 DIAGNOSIS — R7309 Other abnormal glucose: Secondary | ICD-10-CM

## 2013-07-05 DIAGNOSIS — E119 Type 2 diabetes mellitus without complications: Secondary | ICD-10-CM

## 2013-07-05 DIAGNOSIS — E782 Mixed hyperlipidemia: Secondary | ICD-10-CM

## 2013-07-05 LAB — CBC WITH DIFFERENTIAL/PLATELET
BASOS ABS: 0 10*3/uL (ref 0.0–0.1)
BASOS PCT: 0 % (ref 0–1)
EOS ABS: 0.2 10*3/uL (ref 0.0–0.7)
Eosinophils Relative: 3 % (ref 0–5)
HCT: 42.6 % (ref 39.0–52.0)
Hemoglobin: 14.7 g/dL (ref 13.0–17.0)
Lymphocytes Relative: 42 % (ref 12–46)
Lymphs Abs: 3.2 10*3/uL (ref 0.7–4.0)
MCH: 29.2 pg (ref 26.0–34.0)
MCHC: 34.5 g/dL (ref 30.0–36.0)
MCV: 84.7 fL (ref 78.0–100.0)
Monocytes Absolute: 0.3 10*3/uL (ref 0.1–1.0)
Monocytes Relative: 4 % (ref 3–12)
NEUTROS PCT: 51 % (ref 43–77)
Neutro Abs: 3.9 10*3/uL (ref 1.7–7.7)
Platelets: 250 10*3/uL (ref 150–400)
RBC: 5.03 MIL/uL (ref 4.22–5.81)
RDW: 14.7 % (ref 11.5–15.5)
WBC: 7.7 10*3/uL (ref 4.0–10.5)

## 2013-07-05 LAB — HEMOGLOBIN A1C
HEMOGLOBIN A1C: 6 % — AB (ref <5.7)
MEAN PLASMA GLUCOSE: 126 mg/dL — AB (ref <117)

## 2013-07-05 NOTE — Progress Notes (Signed)
Subjective:    Patient ID: Cody Zamora, male    DOB: [birth year], 63 y.o.   MRN: [id number]  HPI Comments: 63 yo Male  presents for 3 month F/U for HTN, Cholesterol, DM, D. Deficient follow up. He is trying to improve exercise and diet. He has lost some weight with cutting back on whites. He notes BP/ BS both good at home. He checks feet routinely and denies skin break down or neuropathy increase. Last labs CHOL         188   04/06/2013 HDL           29   04/06/2013 LDLCALC      128   04/06/2013 TRIG         156   04/06/2013 CHOLHDL      6.5   12/19/201 ALT           22   05/29/2013 AST           27   05/29/2013 ALKPHOS       90   05/29/2013 BILITOT      0.5   05/29/2013 CREATININE     1.09   05/29/2013 BUN              14   05/29/2013 NA              139   05/29/2013 K               4.0   05/29/2013 CL              104   05/29/2013 CO2              27   05/29/2013 HGBA1C      6.4   04/06/2013 D 41 WBC      7.9   05/29/2013 HGB     15.3   05/29/2013 HCT     44.0   05/29/2013 MCV     85.8   05/29/2013 PLT      258   05/29/2013   Diabetes  Hyperlipidemia     Medication List       This list is accurate as of: 07/05/13  9:35 AM.  Always use your most recent med list.               aspirin 81 MG tablet  Take 81 mg by mouth daily.     BENEFIBER DRINK MIX PO  Take by mouth. Drinks 1 packet daily     ezetimibe 10 MG tablet  Commonly known as:  ZETIA  Take 1 tablet (10 mg total) by mouth daily.     FISH OIL PO  Take by mouth daily.     FLONASE 50 MCG/ACT nasal spray  Generic drug:  fluticasone  Place 1 spray into both nostrils daily.     metFORMIN 500 MG 24 hr tablet  Commonly known as:  GLUCOPHAGE XR  2 pills 2 times daily as directed with meals.     multivitamin with minerals tablet  Take 1 tablet by mouth daily.     valsartan 320 MG tablet  Commonly known as:  DIOVAN  Take 320 mg by mouth daily.     vardenafil 20 MG tablet  Commonly known as:  LEVITRA  Take  1 tablet (20 mg total) by mouth daily as needed for erectile dysfunction.     Vitamin D3 2000 UNITS capsule  Take 6,000 Units by mouth daily.  Allergies  Allergen Reactions  . Cialis [Tadalafil] Other (See Comments)    Hot flashes  . Fructose Nausea And Vomiting  . Milk-Related Compounds   . Morphine And Related Itching  . Ppd [Tuberculin Purified Protein Derivative]     Positive 09-2004   Past Medical History  Diagnosis Date  . Allergy     fructose  . Cancer     prostate  . Hyperlipidemia   . Hypertension   . Chronic kidney disease     huge kidney stones  . Pre-diabetes   . Hypogonadism male       Review of Systems  All other systems reviewed and are negative.   BP 124/70  Pulse 66  Temp(Src) 98.2 F (36.8 C) (Temporal)  Resp 18  Ht 5\' 6"  (1.676 m)  Wt 190 lb (86.183 kg)  BMI 30.68 kg/m2     Objective:   Physical Exam  Nursing note and vitals reviewed. Constitutional: He is oriented to person, place, and time. He appears well-developed and well-nourished.  Overweight centrally  HENT:  Head: Normocephalic and atraumatic.  Right Ear: External ear normal.  Left Ear: External ear normal.  Nose: Nose normal.  Eyes: Conjunctivae and EOM are normal.  Neck: Normal range of motion. Neck supple. No JVD present. No thyromegaly present.  Cardiovascular: Normal rate, regular rhythm, normal heart sounds and intact distal pulses.   Pulmonary/Chest: Effort normal and breath sounds normal.  Abdominal: Soft. Bowel sounds are normal. He exhibits no distension and no mass. There is no tenderness. There is no rebound and no guarding.  Musculoskeletal: Normal range of motion. He exhibits no edema and no tenderness.  Lymphadenopathy:    He has no cervical adenopathy.  Neurological: He is alert and oriented to person, place, and time. He has normal reflexes. No cranial nerve deficit. Coordination normal.  Skin: Skin is warm and dry.  Heels dry scaling  Psychiatric: He  has a normal mood and affect. His behavior is normal. Judgment and thought content normal.      DM Foot exam performed    Assessment & Plan:  1.  3 month F/U for HTN, Cholesterol,DM, D. Deficient. Needs healthy diet, cardio QD and obtain healthy weight. Check Labs, Check BP if >130/80 call office, Check BS if >200 call office  2. Dry skin w/ DM-  Advised needs podiatry evaluation with  DM Hx if no improvement, epsom salt soaks, vaseline treatment QD, monitor QD and call with any concerns/ adverse changes

## 2013-07-05 NOTE — Patient Instructions (Signed)
Bad carbs also include fruit juice, alcohol, and sweet tea. These are empty calories that do not signal to your brain that you are full.   Please remember the good carbs are still carbs which convert into sugar. So please measure them out no more than 1/2-1 cup of rice, oatmeal, pasta, and beans.  Veggies are however free foods! Pile them on.   I like lean protein at every meal such as chicken, [place], pork chops, cottage cheese, etc. Just do not fry these meats and please center your meal around vegetable, the meats should be a side dish.   No all fruit is created equal. Please see the list below, the fruit at the bottom is higher in sugars than the fruit at the top   Begin with easy walking, heel, toe and backwards  Do calf raises on a step:  First lower and then raise on 1 foot  If this is painful, lower on 1 foot, do the heel raise on both feet Begin with 3 sets of 10 repetitions  Increase by 5 repetitions every 3 days  Goal is 3 sets of 30 repetitions  Do with both straight knee and knee at 20 degrees of flexion  If pain persists, once you can do 3 sets of 30 without weight, add backpack with 5 lbs.  Increase by 5 lbs per week to max of 30 lbs for 3 sets of 15   Diabetes and Foot Care Diabetes may cause you to have problems because of poor blood supply (circulation) to your feet and legs. This may cause the skin on your feet to become thinner, break easier, and heal more slowly. Your skin may become dry, and the skin may peel and crack. You may also have nerve damage in your legs and feet causing decreased feeling in them. You may not notice minor injuries to your feet that could lead to infections or more serious problems. Taking care of your feet is one of the most important things you can do for yourself.  HOME CARE INSTRUCTIONS  Wear shoes at all times, even in the house. Do not go barefoot. Bare feet are easily injured.  Check your feet daily for blisters, cuts, and redness.  If you cannot see the bottom of your feet, use a mirror or ask someone for help.  Wash your feet with warm water (do not use hot water) and mild soap. Then pat your feet and the areas between your toes until they are completely dry. Do not soak your feet as this can dry your skin.  Apply a moisturizing lotion or petroleum jelly (that does not contain alcohol and is unscented) to the skin on your feet and to dry, brittle toenails. Do not apply lotion between your toes.  Trim your toenails straight across. Do not dig under them or around the cuticle. File the edges of your nails with an emery board or nail file.  Do not cut corns or calluses or try to remove them with medicine.  Wear clean socks or stockings every day. Make sure they are not too tight. Do not wear knee-high stockings since they may decrease blood flow to your legs.  Wear shoes that fit properly and have enough cushioning. To break in new shoes, wear them for just a few hours a day. This prevents you from injuring your feet. Always look in your shoes before you put them on to be sure there are no objects inside.  Do not cross your  legs. This may decrease the blood flow to your feet.  If you find a minor scrape, cut, or break in the skin on your feet, keep it and the skin around it clean and dry. These areas may be cleansed with mild soap and water. Do not cleanse the area with peroxide, alcohol, or iodine.  When you remove an adhesive bandage, be sure not to damage the skin around it.  If you have a wound, look at it several times a day to make sure it is healing.  Do not use heating pads or hot water bottles. They may burn your skin. If you have lost feeling in your feet or legs, you may not know it is happening until it is too late.  Make sure your health care provider performs a complete foot exam at least annually or more often if you have foot problems. Report any cuts, sores, or bruises to your health care provider  immediately. SEEK MEDICAL CARE IF:   You have an injury that is not healing.  You have cuts or breaks in the skin.  You have an ingrown nail.  You notice redness on your legs or feet.  You feel burning or tingling in your legs or feet.  You have pain or cramps in your legs and feet.  Your legs or feet are numb.  Your feet always feel cold. SEEK IMMEDIATE MEDICAL CARE IF:   There is increasing redness, swelling, or pain in or around a wound.  There is a red line that goes up your leg.  Pus is coming from a wound.  You develop a fever or as directed by your health care provider.  You notice a bad smell coming from an ulcer or wound. Document Released: 04/02/2000 Document Revised: 12/06/2012 Document Reviewed: 09/12/2012 [facility] Patient Information 2014 [place]. Diabetes and Exercise Exercising regularly is important. It is not just about losing weight. It has many health benefits, such as:  Improving your overall fitness, flexibility, and endurance.  Increasing your bone density.  Helping with weight control.  Decreasing your body fat.  Increasing your muscle strength.  Reducing stress and tension.  Improving your overall health. People with diabetes who exercise gain additional benefits because exercise:  Reduces appetite.  Improves the body's use of blood sugar (glucose).  Helps lower or control blood glucose.  Decreases blood pressure.  Helps control blood lipids (such as cholesterol and triglycerides).  Improves the body's use of the hormone insulin by:  Increasing the body's insulin sensitivity.  Reducing the body's insulin needs.  Decreases the risk for heart disease because exercising:  Lowers cholesterol and triglycerides levels.  Increases the levels of good cholesterol (such as high-density lipoproteins [HDL]) in the body.  Lowers blood glucose levels. YOUR ACTIVITY PLAN  Choose an activity that you enjoy and set realistic  goals. Your health care provider or diabetes educator can help you make an activity plan that works for you. You can break activities into 2 or 3 sessions throughout the day. Doing so is as good as one long session. Exercise ideas include:  Taking the dog for a walk.  Taking the stairs instead of the elevator.  Dancing to your favorite song.  Doing your favorite exercise with a friend. RECOMMENDATIONS FOR EXERCISING WITH TYPE 1 OR TYPE 2 DIABETES   Check your blood glucose before exercising. If blood glucose levels are greater than 240 mg/dL, check for urine ketones. Do not exercise if ketones are present.  Avoid injecting  insulin into areas of the body that are going to be exercised. For example, avoid injecting insulin into:  The arms when playing tennis.  The legs when jogging.  Keep a record of:  Food intake before and after you exercise.  Expected peak times of insulin action.  Blood glucose levels before and after you exercise.  The type and amount of exercise you have done.  Review your records with your health care provider. Your health care provider will help you to develop guidelines for adjusting food intake and insulin amounts before and after exercising.  If you take insulin or oral hypoglycemic agents, watch for signs and symptoms of hypoglycemia. They include:  Dizziness.  Shaking.  Sweating.  Chills.  Confusion.  Drink plenty of water while you exercise to prevent dehydration or heat stroke. Body water is lost during exercise and must be replaced.  Talk to your health care provider before starting an exercise program to make sure it is safe for you. Remember, almost any type of activity is better than none. Document Released: 06/26/2003 Document Revised: 12/06/2012 Document Reviewed: 09/12/2012 [facility] Patient Information 2014 [place]. We want weight loss that will last so you should lose 1-2 pounds a week.  THAT IS IT! Please pick THREE things  a month to change. Once it is a habit check off the item. Then pick another three items off the list to become habits.  If you are already doing a habit on the list GREAT!  Cross that item off! o Don't drink your calories. Ie, alcohol, soda, fruit juice, and sweet tea.  o Drink more water. Drink a glass when you feel hungry or before each meal.  o Eat breakfast - Complex carb and protein (likeDannon light and fit yogurt, oatmeal, fruit, eggs, [place] bacon). o Measure your cereal.  Eat no more than one cup a day. (ie [place]) o Eat an apple a day. o Add a vegetable a day. o Try a new vegetable a month. o Use Pam! Stop using oil or butter to cook. o Don't finish your plate or use smaller plates. o Share your dessert. o Eat sugar free Jello for dessert or frozen grapes. o Don't eat 2-3 hours before bed. o Switch to whole wheat bread, pasta, and brown rice. o Make healthier choices when you eat out. No fries! o Pick baked chicken, NOT fried. o Don't forget to SLOW DOWN when you eat. It is not going anywhere.  o Take the stairs. o Park far away in the parking lot o [employer] (or weights) for 10 minutes while watching TV. o Walk at work for 10 minutes during break. o Walk outside 1 time a week with your friend, kids, dog, or significant other. o Start a walking group at [place] the mall as much as you can tolerate.  o Keep a food diary. o Weigh yourself daily. o Walk for 15 minutes 3 days per week. o Cook at home more often and eat out less.  If life happens and you go back to old habits, it is okay.  Just start over. You can do it!   If you experience chest pain, get short of breath, or tired during the exercise, please stop immediately and inform your doctor.

## 2013-07-06 LAB — HEPATIC FUNCTION PANEL
ALBUMIN: 4.4 g/dL (ref 3.5–5.2)
ALK PHOS: 66 U/L (ref 39–117)
ALT: 22 U/L (ref 0–53)
AST: 27 U/L (ref 0–37)
BILIRUBIN DIRECT: 0.1 mg/dL (ref 0.0–0.3)
Indirect Bilirubin: 0.4 mg/dL (ref 0.2–1.2)
Total Bilirubin: 0.5 mg/dL (ref 0.2–1.2)
Total Protein: 7.1 g/dL (ref 6.0–8.3)

## 2013-07-06 LAB — LIPID PANEL
Cholesterol: 123 mg/dL (ref 0–200)
HDL: 35 mg/dL — ABNORMAL LOW (ref >39)
LDL CALC: 75 mg/dL (ref 0–99)
Total CHOL/HDL Ratio: 3.5 Ratio
Triglycerides: 66 mg/dL (ref <150)
VLDL: 13 mg/dL (ref 0–40)

## 2013-07-06 LAB — BASIC METABOLIC PANEL WITH GFR
BUN: 16 mg/dL (ref 6–23)
CO2: 26 mEq/L (ref 19–32)
Calcium: 9.1 mg/dL (ref 8.4–10.5)
Chloride: 106 mEq/L (ref 96–112)
Creat: 1.09 mg/dL (ref 0.50–1.35)
GFR, EST AFRICAN AMERICAN: 82 mL/min
GFR, EST NON AFRICAN AMERICAN: 71 mL/min
Glucose, Bld: 97 mg/dL (ref 70–99)
POTASSIUM: 4.1 meq/L (ref 3.5–5.3)
Sodium: 138 mEq/L (ref 135–145)

## 2013-07-06 LAB — VITAMIN D 25 HYDROXY (VIT D DEFICIENCY, FRACTURES): VIT D 25 HYDROXY: 76 ng/mL (ref 30–89)

## 2013-07-06 LAB — MAGNESIUM: Magnesium: 1.7 mg/dL (ref 1.5–2.5)

## 2013-07-06 LAB — INSULIN, FASTING: INSULIN FASTING, SERUM: 17 u[IU]/mL (ref 3–28)

## 2013-07-09 ENCOUNTER — Encounter: Payer: Self-pay | Admitting: Emergency Medicine

## 2013-07-09 DIAGNOSIS — E1129 Type 2 diabetes mellitus with other diabetic kidney complication: Secondary | ICD-10-CM | POA: Insufficient documentation

## 2013-09-17 ENCOUNTER — Other Ambulatory Visit: Payer: Self-pay

## 2013-09-17 MED ORDER — VALSARTAN 320 MG PO TABS: 320.0000 mg | ORAL_TABLET | Freq: Every day | ORAL | Status: DC

## 2013-10-06 DIAGNOSIS — Z79899 Other long term (current) drug therapy: Secondary | ICD-10-CM | POA: Insufficient documentation

## 2013-10-06 NOTE — Progress Notes (Signed)
Patient ID: Cody Zamora, male   DOB: November 19, 1950, 63 y.o.   MRN: [id number]   Annual Screening Comprehensive Examination  This very nice 63 y.o.  male presents for complete physical.  Patient has been followed for HTN, T2_NIDDM, Prostate Cancer, Hyperlipidemia, and Vitamin D Deficiency.   HTN predates since     . Patient's BP has been controlled at home.Today's  . Patient denies any cardiac symptoms as chest pain, palpitations, shortness of breath, dizziness or ankle swelling.   Patient's hyperlipidemia is controlled with diet and medications. Patient denies myalgias or other medication SE's. Last cholesterol last visit was  , triglycerides  , HDL    and LDL   .   Lab Results  Component Value Date   CHOL 123 07/05/2013   HDL 35* 07/05/2013   LDLCALC 75 07/05/2013   TRIG 66 07/05/2013   CHOLHDL 3.5 07/05/2013     Patient has Diabetes with last A1c 6.0% in March 2015  . Patient denies reactive hypoglycemic symptoms, visual blurring, diabetic polys, or paresthesias.    Finally, patient has history of Vitamin D Deficiency with last vitamin D 76 ng/ml in March 2015.  Current Outpatient Prescriptions on File Prior to Visit  Medication Sig Dispense Refill  . aspirin 81 MG tablet Take 81 mg by mouth daily.        . Cholecalciferol (VITAMIN D3) 2000 UNITS capsule Take 6,000 Units by mouth daily.      Marland Kitchen ezetimibe (ZETIA) 10 MG tablet Take 1 tablet (10 mg total) by mouth daily.  90 tablet  1  . fluticasone (FLONASE) 50 MCG/ACT nasal spray Place 1 spray into both nostrils daily.      . metFORMIN (GLUCOPHAGE XR) 500 MG 24 hr tablet 2 pills 2 times daily as directed with meals.  360 tablet  1  . Multiple Vitamins-Minerals (MULTIVITAMIN WITH MINERALS) tablet Take 1 tablet by mouth daily.        . Omega-3 Fatty Acids (FISH OIL PO) Take by mouth daily.      . valsartan (DIOVAN) 320 MG tablet Take 1 tablet (320 mg total) by mouth daily.  90 tablet  3  . vardenafil (LEVITRA) 20 MG tablet Take 1 tablet (20  mg total) by mouth daily as needed for erectile dysfunction.  10 tablet  1  . Wheat Dextrin (BENEFIBER DRINK MIX PO) Take by mouth. Drinks 1 packet daily       No current facility-administered medications on file prior to visit.    Allergies  Allergen Reactions  . Cialis [Tadalafil] Other (See Comments)    Hot flashes  . Fructose Nausea And Vomiting  . Milk-Related Compounds   . Morphine And Related Itching  . Ppd [Tuberculin Purified Protein Derivative]     Positive 09-2004    Past Medical History  Diagnosis Date  . Allergy     fructose  . Cancer     prostate  . Hyperlipidemia   . Hypertension   . Chronic kidney disease     huge kidney stones  . Pre-diabetes   . Hypogonadism male   . Diabetes mellitus without complication 9163  . DM (diabetes mellitus) 07/09/2013    Past Surgical History  Procedure Laterality Date  . Finger amputation      left Middle  . Kidney stone surgery      twice  . Robot assisted laparoscopic radical prostatectomy  2006  . Colonoscopy      Family History  Problem Relation Age of  Onset  . Heart disease Mother   . Diabetes Mother   . Arthritis Mother   . Alcohol abuse Father   . Hypertension Father     History   Social History  . Marital Status: Married    Spouse Name: N/A    Number of Children: N/A  . Years of Education: N/A   Occupational History  . Not on file.   Social History Main Topics  . Smoking status: Never Smoker   . Smokeless tobacco: Never Used  . Alcohol Use: Yes     Comment: rarely  . Drug Use: No  . Sexual Activity: Not on file   Other Topics Concern  . Not on file   Social History Narrative  . No narrative on file    ROS Constitutional: Denies fever, chills, weight loss/gain, headaches, insomnia, fatigue, night sweats or change in appetite. Eyes: Denies redness, blurred vision, diplopia, discharge, itchy or watery eyes.  ENT: Denies discharge, congestion, post nasal drip, epistaxis, sore throat,  earache, hearing loss, dental pain, Tinnitus, Vertigo, Sinus pain or snoring.  Cardio: Denies chest pain, palpitations, irregular heartbeat, syncope, dyspnea, diaphoresis, orthopnea, PND, claudication or edema Respiratory: denies cough, dyspnea, DOE, pleurisy, hoarseness, laryngitis or wheezing.  Gastrointestinal: Denies dysphagia, heartburn, reflux, water brash, pain, cramps, nausea, vomiting, bloating, diarrhea, constipation, hematemesis, melena, hematochezia, jaundice or hemorrhoids Genitourinary: Denies dysuria, frequency, urgency, nocturia, hesitancy, discharge, hematuria or flank pain Musculoskeletal: Denies arthralgia, myalgia, stiffness, Jt. Swelling, pain, limp or strain/sprain. Skin: Denies puritis, rash, hives, warts, acne, eczema or change in skin lesion Neuro: No weakness, tremor, incoordination, spasms, paresthesia or pain Psychiatric: Denies confusion, memory loss or sensory loss Endocrine: Denies change in weight, skin, hair change, nocturia, and paresthesia, diabetic polys, visual blurring or hyper / hypo glycemic episodes.  Heme/Lymph: No excessive bleeding, bruising or enlarged lymph nodes.  Physical Exam  There were no vitals taken for this visit.  General Appearance: Well nourished, in no apparent distress. Eyes: PERRLA, EOMs, conjunctiva no swelling or erythema, normal fundi and vessels. Sinuses: No frontal/maxillary tenderness ENT/Mouth: EACs patent / TMs  nl. Nares clear without erythema, swelling, mucoid exudates. Oral hygiene is good. No erythema, swelling, or exudate. Tongue normal, non-obstructing. Tonsils not swollen or erythematous. Hearing normal.  Neck: Supple, thyroid normal. No bruits, nodes or JVD. Respiratory: Respiratory effort normal.  BS equal and clear bilateral without rales, rhonci, wheezing or stridor. Cardio: Heart sounds are normal with regular rate and rhythm and no murmurs, rubs or gallops. Peripheral pulses are normal and equal bilaterally without  edema. No aortic or femoral bruits. Chest: symmetric with normal excursions and percussion.  Abdomen: Flat, soft, with bowl sounds. Nontender, no guarding, rebound, hernias, masses, or organomegaly.  Lymphatics: Non tender without lymphadenopathy.  Genitourinary: No hernias.Testes nl. DRE - prostate nl for age - smooth & firm w/o nodules. Musculoskeletal: Full ROM all peripheral extremities, joint stability, 5/5 strength, and normal gait. Skin: Warm and dry without rashes, lesions, cyanosis, clubbing or  ecchymosis.  Neuro: Cranial nerves intact, reflexes equal bilaterally. Normal muscle tone, no cerebellar symptoms. Sensation intact.  Pysch: Awake and oriented X 3, normal affect, insight and judgment appropriate.   Assessment and Plan  1. Annual Screening Examination 2. Hypertension  3. Hyperlipidemia 4. T2_NIDDM 5. Vitamin D Deficiency 6. Prostate Cancer, Hx  Continue prudent diet as discussed, weight control, BP monitoring, regular exercise, and medications as discussed.  Discussed med effects and SE's. Routine screening labs and tests as requested with regular follow-up as  recommended.

## 2013-10-08 ENCOUNTER — Ambulatory Visit (INDEPENDENT_AMBULATORY_CARE_PROVIDER_SITE_OTHER): Admitting: Internal Medicine

## 2013-10-08 ENCOUNTER — Encounter: Payer: Self-pay | Admitting: Internal Medicine

## 2013-10-08 VITALS — BP 120/82 | HR 72 | Temp 97.5°F | Resp 16 | Ht 65.5 in | Wt 180.6 lb

## 2013-10-08 DIAGNOSIS — Z79899 Other long term (current) drug therapy: Secondary | ICD-10-CM

## 2013-10-08 DIAGNOSIS — C61 Malignant neoplasm of prostate: Secondary | ICD-10-CM

## 2013-10-08 DIAGNOSIS — E1310 Other specified diabetes mellitus with ketoacidosis without coma: Secondary | ICD-10-CM

## 2013-10-08 DIAGNOSIS — Z789 Other specified health status: Secondary | ICD-10-CM

## 2013-10-08 DIAGNOSIS — E559 Vitamin D deficiency, unspecified: Secondary | ICD-10-CM

## 2013-10-08 DIAGNOSIS — E1129 Type 2 diabetes mellitus with other diabetic kidney complication: Secondary | ICD-10-CM

## 2013-10-08 DIAGNOSIS — Z113 Encounter for screening for infections with a predominantly sexual mode of transmission: Secondary | ICD-10-CM

## 2013-10-08 DIAGNOSIS — E1110 Type 2 diabetes mellitus with ketoacidosis without coma: Secondary | ICD-10-CM

## 2013-10-08 DIAGNOSIS — R7401 Elevation of levels of liver transaminase levels: Secondary | ICD-10-CM

## 2013-10-08 DIAGNOSIS — Z1212 Encounter for screening for malignant neoplasm of rectum: Secondary | ICD-10-CM

## 2013-10-08 DIAGNOSIS — R7402 Elevation of levels of lactic acid dehydrogenase (LDH): Secondary | ICD-10-CM

## 2013-10-08 DIAGNOSIS — Z0000 Encounter for general adult medical examination without abnormal findings: Secondary | ICD-10-CM

## 2013-10-08 DIAGNOSIS — I10 Essential (primary) hypertension: Secondary | ICD-10-CM

## 2013-10-08 DIAGNOSIS — Z125 Encounter for screening for malignant neoplasm of prostate: Secondary | ICD-10-CM

## 2013-10-08 DIAGNOSIS — E782 Mixed hyperlipidemia: Secondary | ICD-10-CM

## 2013-10-08 DIAGNOSIS — Z1331 Encounter for screening for depression: Secondary | ICD-10-CM

## 2013-10-08 DIAGNOSIS — R740 Nonspecific elevation of levels of transaminase and lactic acid dehydrogenase [LDH]: Secondary | ICD-10-CM

## 2013-10-08 LAB — CBC WITH DIFFERENTIAL/PLATELET
BASOS ABS: 0 10*3/uL (ref 0.0–0.1)
Basophils Relative: 0 % (ref 0–1)
Eosinophils Absolute: 0.2 10*3/uL (ref 0.0–0.7)
Eosinophils Relative: 2 % (ref 0–5)
HCT: 44.2 % (ref 39.0–52.0)
Hemoglobin: 15.2 g/dL (ref 13.0–17.0)
LYMPHS PCT: 39 % (ref 12–46)
Lymphs Abs: 3 10*3/uL (ref 0.7–4.0)
MCH: 29.5 pg (ref 26.0–34.0)
MCHC: 34.4 g/dL (ref 30.0–36.0)
MCV: 85.8 fL (ref 78.0–100.0)
Monocytes Absolute: 0.4 10*3/uL (ref 0.1–1.0)
Monocytes Relative: 5 % (ref 3–12)
NEUTROS ABS: 4.1 10*3/uL (ref 1.7–7.7)
NEUTROS PCT: 54 % (ref 43–77)
PLATELETS: 272 10*3/uL (ref 150–400)
RBC: 5.15 MIL/uL (ref 4.22–5.81)
RDW: 14.2 % (ref 11.5–15.5)
WBC: 7.6 10*3/uL (ref 4.0–10.5)

## 2013-10-08 LAB — HEMOGLOBIN A1C
Hgb A1c MFr Bld: 5.7 % — ABNORMAL HIGH (ref <5.7)
Mean Plasma Glucose: 117 mg/dL — ABNORMAL HIGH (ref <117)

## 2013-10-08 NOTE — Patient Instructions (Signed)
Preventative Care for Adults, Male       REGULAR HEALTH EXAMS:  A routine yearly physical is a good way to check in with your primary care provider about your health and preventive screening. It is also an opportunity to share updates about your health and any concerns you have, and receive a thorough all-over exam.   Most health insurance companies pay for at least some preventative services.  Check with your health plan for specific coverages.  WHAT PREVENTATIVE SERVICES DO MEN NEED?  Adult men should have their weight and blood pressure checked regularly.   Men age 35 and older should have their cholesterol levels checked regularly.  Beginning at age 50 and continuing to age 75, men should be screened for colorectal cancer.  Certain people should may need continued testing until age 85.  Other cancer screening may include exams for testicular and prostate cancer.  Updating vaccinations is part of preventative care.  Vaccinations help protect against diseases such as the flu.  Lab tests are generally done as part of preventative care to screen for anemia and blood disorders, to screen for problems with the kidneys and liver, to screen for bladder problems, to check blood sugar, and to check your cholesterol level.  Preventative services generally include counseling about diet, exercise, avoiding tobacco, drugs, excessive alcohol consumption, and sexually transmitted infections.    GENERAL RECOMMENDATIONS FOR GOOD HEALTH:  Healthy diet:  Eat a variety of foods, including fruit, vegetables, animal or vegetable protein, such as meat, fish, chicken, and eggs, or beans, lentils, tofu, and grains, such as rice.  Drink plenty of water daily.  Decrease saturated fat in the diet, avoid lots of red meat, processed foods, sweets, fast foods, and fried foods.  Exercise:  Aerobic exercise helps maintain good heart health. At least 30-40 minutes of moderate-intensity exercise is recommended.  For example, a brisk walk that increases your heart rate and breathing. This should be done on most days of the week.   Find a type of exercise or a variety of exercises that you enjoy so that it becomes a part of your daily life.  Examples are running, walking, swimming, water aerobics, and biking.  For motivation and support, explore group exercise such as aerobic class, spin class, Zumba, Yoga,or  martial arts, etc.    Set exercise goals for yourself, such as a certain weight goal, walk or run in a race such as a 5k walk/run.  Speak to your primary care provider about exercise goals.  Disease prevention:  If you smoke or chew tobacco, find out from your caregiver how to quit. It can literally save your life, no matter how long you have been a tobacco user. If you do not use tobacco, never begin.   Maintain a healthy diet and normal weight. Increased weight leads to problems with blood pressure and diabetes.   The Body Mass Index or BMI is a way of measuring how much of your body is fat. Having a BMI above 27 increases the risk of heart disease, diabetes, hypertension, stroke and other problems related to obesity. Your caregiver can help determine your BMI and based on it develop an exercise and dietary program to help you achieve or maintain this important measurement at a healthful level.  High blood pressure causes heart and blood vessel problems.  Persistent high blood pressure should be treated with medicine if weight loss and exercise do not work.   Fat and cholesterol leaves deposits in your arteries   that can block them. This causes heart disease and vessel disease elsewhere in your body.  If your cholesterol is found to be high, or if you have heart disease or certain other medical conditions, then you may need to have your cholesterol monitored frequently and be treated with medication.   Ask if you should have a stress test if your history suggests this. A stress test is a test done on  a treadmill that looks for heart disease. This test can find disease prior to there being a problem.  Avoid drinking alcohol in excess (more than two drinks per day).  Avoid use of street drugs. Do not share needles with anyone. Ask for professional help if you need assistance or instructions on stopping the use of alcohol, cigarettes, and/or drugs.  Brush your teeth twice a day with fluoride toothpaste, and floss once a day. Good oral hygiene prevents tooth decay and gum disease. The problems can be painful, unattractive, and can cause other health problems. Visit your dentist for a routine oral and dental check up and preventive care every 6-12 months.   Look at your skin regularly.  Use a mirror to look at your back. Notify your caregivers of changes in moles, especially if there are changes in shapes, colors, a size larger than a pencil eraser, an irregular border, or development of new moles.  Safety:  Use seatbelts 100% of the time, whether driving or as a passenger.  Use safety devices such as hearing protection if you work in environments with loud noise or significant background noise.  Use safety glasses when doing any work that could send debris in to the eyes.  Use a helmet if you ride a bike or motorcycle.  Use appropriate safety gear for contact sports.  Talk to your caregiver about gun safety.  Use sunscreen with a SPF (or skin protection factor) of 15 or greater.  Lighter skinned people are at a greater risk of skin cancer. Don't forget to also wear sunglasses in order to protect your eyes from too much damaging sunlight. Damaging sunlight can accelerate cataract formation.   Practice safe sex. Use condoms. Condoms are used for birth control and to help reduce the spread of sexually transmitted infections (or STIs).  Some of the STIs are gonorrhea (the clap), chlamydia, syphilis, trichomonas, herpes, HPV (human papilloma virus) and HIV (human immunodeficiency virus) which causes AIDS.  The herpes, HIV and HPV are viral illnesses that have no cure. These can result in disability, cancer and death.   Keep carbon monoxide and smoke detectors in your home functioning at all times. Change the batteries every 6 months or use a model that plugs into the wall.   Vaccinations:  Stay up to date with your tetanus shots and other required immunizations. You should have a booster for tetanus every 10 years. Be sure to get your flu shot every year, since 5%-20% of the [place] population comes down with the flu. The flu vaccine changes each year, so being vaccinated once is not enough. Get your shot in the fall, before the flu season peaks.   Other vaccines to consider:  Pneumococcal vaccine to protect against certain types of pneumonia.  This is normally recommended for adults age 65 or older.  However, adults younger than 65 years old with certain underlying conditions such as diabetes, heart or lung disease should also receive the vaccine.  Shingles vaccine to protect against Varicella Zoster if you are older than age 60, or younger   than 65 years old with certain underlying illness.  Hepatitis A vaccine to protect against a form of infection of the liver by a virus acquired from food.  Hepatitis B vaccine to protect against a form of infection of the liver by a virus acquired from blood or body fluids, particularly if you work in health care.  If you plan to travel internationally, check with your local health department for specific vaccination recommendations.  Cancer Screening:  Most routine colon cancer screening begins at the age of 50. On a yearly basis, doctors may provide special easy to use take-home tests to check for hidden blood in the stool. Sigmoidoscopy or colonoscopy can detect the earliest forms of colon cancer and is life saving. These tests use a small camera at the end of a tube to directly examine the colon. Speak to your caregiver about this at age 50, when routine  screening begins (and is repeated every 5 years unless early forms of pre-cancerous polyps or small growths are found).   At the age of 50 men usually start screening for prostate cancer every year. Screening may begin at a younger age for those with higher risk. Those at higher risk include African-Americans or having a family history of prostate cancer. There are two types of tests for prostate cancer:   Prostate-specific antigen (PSA) testing. Recent studies raise questions about prostate cancer using PSA and you should discuss this with your caregiver.   Digital rectal exam (in which your doctor's lubricated and gloved finger feels for enlargement of the prostate through the anus).   Screening for testicular cancer.  Do a monthly exam of your testicles. Gently roll each testicle between your thumb and fingers, feeling for any abnormal lumps. The best time to do this is after a hot shower or bath when the tissues are looser. Notify your caregivers of any lumps, tenderness or changes in size or shape immediately.     

## 2013-10-08 NOTE — Progress Notes (Signed)
Patient ID: Cody Zamora, male   DOB: 07-13-1950, 63 y.o.   MRN: [id number]   Annual  Comprehensive Examination  This very nice 63 y.o.MBM presents for complete physical.  Patient has been followed for HTN, T2_NIDDM, Hyperlipidemia, and Vitamin D Deficiency.Patient also has Hx/o Prostate Ca (2008)  s/p Surg .   HTN predates since 1996. Patient's BP has been controlled at home.Today's BP: 120/82 mmHg. Patient denies any cardiac symptoms as chest pain, palpitations, shortness of breath, dizziness or ankle swelling.   Patient's hyperlipidemia is controlled with diet and medications. Note patient is intolerant of Lipitor (elevated CPK).  Patient denies myalgias or other medication SE's. Last lipids as below were at goal.  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 Last A1c was 6.0% in Mar 2015. Patient denies reactive hypoglycemic symptoms, visual blurring, diabetic polys or paresthesias.    Finally, patient has history of Vitamin D Deficiency of  11 in 2008  and last vitamin D was 47 in Mar 2015.  Medication Sig  . aspirin 81 MG tablet Take 81 mg by mouth daily.    Marland Kitchen VITAMIN D 2000 UNITS capsule Take 6,000 Units by mouth daily.  Marland Kitchen ezetimibe (ZETIA) 10 MG tablet Take 1 tablet (10 mg total) by mouth daily.  . fluticasone (FLONASE)  nasal  Place 1 spray into both nostrils daily.  . metFORMIN XR 500 MG 24 hr tablet 2 pills 2 times daily as directed with meals.  .  (MULTIVIT WITH MIN Take 1 tablet by mouth daily.    Marland Kitchen FISH OIL  Take by mouth daily.  . valsartan  320 MG tablet Take 1 tablet (320 mg total) by mouth daily.  . vardenafil (LEVITRA) 20 MG tablet Take 1 tablet  by mouth daily as needed  . BENEFIBER DRINK MIX  Drinks 1 packet daily   Allergies  Allergen Reactions  . Cialis [Tadalafil] Other (See  Comments)    Hot flashes  . Fructose Nausea And Vomiting  . Milk-Related Compounds   . Morphine And Related Itching  . Ppd [Tuberculin Purified Protein Derivative]     Positive 09-2004   Past Medical History  Diagnosis Date  . Allergy     fructose  . Cancer     prostate  . Hyperlipidemia   . Hypertension   . Chronic kidney disease     huge kidney stones  . Pre-diabetes   . Hypogonadism male   . Diabetes mellitus without complication 0938  . DM (diabetes mellitus) 07/09/2013   Past Surgical History  Procedure Laterality Date  . Finger amputation      left Middle  . Kidney stone surgery      twice  . Robot assisted laparoscopic radical prostatectomy  2006  . Colonoscopy     Family History  Problem Relation Age of Onset  . Heart disease Mother   . Diabetes Mother   . Arthritis Mother   . Alcohol abuse Father   . Hypertension Father    History   Social History  . Marital Status: Married    Spouse Name: N/A    Number of Children: N/A  . Years of Education: N/A   Occupational History  . Owner/operator of several group homes   Social  History Main Topics  . Smoking status: Never Smoker   . Smokeless tobacco: Never Used  . Alcohol Use: Yes     Comment: rarely  . Drug Use: No  . Sexual Activity: Not on file    ROS Constitutional: Denies fever, chills, weight loss/gain, headaches, insomnia, fatigue, night sweats or change in appetite. Eyes: Denies redness, blurred vision, diplopia, discharge, itchy or watery eyes.  ENT: Denies discharge, congestion, post nasal drip, epistaxis, sore throat, earache, hearing loss, dental pain, Tinnitus, Vertigo, Sinus pain or snoring.  Cardio: Denies chest pain, palpitations, irregular heartbeat, syncope, dyspnea, diaphoresis, orthopnea, PND, claudication or edema Respiratory: denies cough, dyspnea, DOE, pleurisy, hoarseness, laryngitis or wheezing.  Gastrointestinal: Denies dysphagia, heartburn, reflux, water brash, pain, cramps,  nausea, vomiting, bloating, diarrhea, constipation, hematemesis, melena, hematochezia, jaundice or hemorrhoids Genitourinary: Denies dysuria, frequency, urgency, nocturia, hesitancy, discharge, hematuria or flank pain Musculoskeletal: Denies arthralgia, myalgia, stiffness, Jt. Swelling, pain, limp or strain/sprain.Denies falls. Skin: Denies puritis, rash, hives, warts, acne, eczema or change in skin lesion Neuro: No weakness, tremor, incoordination, spasms, paresthesia or pain Psychiatric: Denies confusion, memory loss or sensory loss. Denies depression. Endocrine: Denies change in weight, skin, hair change, nocturia, and paresthesia, diabetic polys, visual blurring or hyper / hypo glycemic episodes.  Heme/Lymph: No excessive bleeding, bruising or enlarged lymph nodes.  Physical Exam  BP 120/82  Pulse 72  Temp 97.5 F   Resp 16  Ht 5' 5.5"   Wt 180 lb 9.6 oz   BMI 29.59 kg/m2  General Appearance: Well nourished, in no apparent distress. Eyes: PERRLA, EOMs, conjunctiva no swelling or erythema, normal fundi and vessels. Sinuses: No frontal/maxillary tenderness ENT/Mouth: EACs patent / TMs  nl. Nares clear without erythema, swelling, mucoid exudates. Oral hygiene is good. No erythema, swelling, or exudate. Tongue normal, non-obstructing. Tonsils not swollen or erythematous. Hearing normal.  Neck: Supple, thyroid normal. No bruits, nodes or JVD. Respiratory: Respiratory effort normal.  BS equal and clear bilateral without rales, rhonci, wheezing or stridor. Cardio: Heart sounds are normal with regular rate and rhythm and no murmurs, rubs or gallops. Peripheral pulses are normal and equal bilaterally without edema. No aortic or femoral bruits. Chest: symmetric with normal excursions and percussion.  Abdomen: Flat, soft, with bowl sounds. Nontender, no guarding, rebound, hernias, masses, or organomegaly.  Lymphatics: Non tender without lymphadenopathy.  Genitourinary:Deferred to  Urologist. Musculoskeletal: Full ROM all peripheral extremities, joint stability, 5/5 strength, and normal gait. Skin: Warm and dry without rashes, lesions, cyanosis, clubbing or  ecchymosis.  Neuro: Cranial nerves intact, reflexes equal bilaterally. Normal muscle tone, no cerebellar symptoms. Sensation intact.  Pysch: Awake and oriented X 3, normal affect, insight and judgment appropriate.   Assessment and Plan  1. Annual Comprehensive  Examination 2. Hypertension  3. Hyperlipidemia 4. T2_NIDDM w/ Stage 2 CKD 5. Vitamin D Deficiency 6. Cancer Prostate  Continue prudent diet as discussed, weight control, BP monitoring, regular exercise, and medications as discussed.  Discussed med effects and SE's. Routine screening labs and tests as requested with regular follow-up as recommended.

## 2013-10-09 LAB — HEPATIC FUNCTION PANEL
ALBUMIN: 4.8 g/dL (ref 3.5–5.2)
ALT: 16 U/L (ref 0–53)
AST: 23 U/L (ref 0–37)
Alkaline Phosphatase: 78 U/L (ref 39–117)
BILIRUBIN INDIRECT: 0.4 mg/dL (ref 0.2–1.2)
BILIRUBIN TOTAL: 0.5 mg/dL (ref 0.2–1.2)
Bilirubin, Direct: 0.1 mg/dL (ref 0.0–0.3)
Total Protein: 7.9 g/dL (ref 6.0–8.3)

## 2013-10-09 LAB — VITAMIN D 25 HYDROXY (VIT D DEFICIENCY, FRACTURES): Vit D, 25-Hydroxy: 84 ng/mL (ref 30–89)

## 2013-10-09 LAB — HEPATITIS A ANTIBODY, TOTAL: HEP A TOTAL AB: NONREACTIVE

## 2013-10-09 LAB — BASIC METABOLIC PANEL WITH GFR
BUN: 18 mg/dL (ref 6–23)
CO2: 27 mEq/L (ref 19–32)
Calcium: 9.6 mg/dL (ref 8.4–10.5)
Chloride: 100 mEq/L (ref 96–112)
Creat: 0.98 mg/dL (ref 0.50–1.35)
GFR, EST NON AFRICAN AMERICAN: 81 mL/min
Glucose, Bld: 81 mg/dL (ref 70–99)
POTASSIUM: 4.4 meq/L (ref 3.5–5.3)
Sodium: 136 mEq/L (ref 135–145)

## 2013-10-09 LAB — URINALYSIS, ROUTINE W REFLEX MICROSCOPIC
Bilirubin Urine: NEGATIVE
Glucose, UA: NEGATIVE mg/dL
Hgb urine dipstick: NEGATIVE
KETONES UR: NEGATIVE mg/dL
Nitrite: POSITIVE — AB
PH: 6.5 (ref 5.0–8.0)
PROTEIN: NEGATIVE mg/dL
Specific Gravity, Urine: 1.012 (ref 1.005–1.030)
UROBILINOGEN UA: 1 mg/dL (ref 0.0–1.0)

## 2013-10-09 LAB — MAGNESIUM: Magnesium: 1.8 mg/dL (ref 1.5–2.5)

## 2013-10-09 LAB — RPR

## 2013-10-09 LAB — URINALYSIS, MICROSCOPIC ONLY
CASTS: NONE SEEN
CRYSTALS: NONE SEEN
SQUAMOUS EPITHELIAL / LPF: NONE SEEN

## 2013-10-09 LAB — LIPID PANEL
CHOLESTEROL: 123 mg/dL (ref 0–200)
HDL: 39 mg/dL — AB (ref >39)
LDL CALC: 70 mg/dL (ref 0–99)
TRIGLYCERIDES: 72 mg/dL (ref <150)
Total CHOL/HDL Ratio: 3.2 Ratio
VLDL: 14 mg/dL (ref 0–40)

## 2013-10-09 LAB — TESTOSTERONE: Testosterone: 301 ng/dL (ref 300–890)

## 2013-10-09 LAB — HEPATITIS B SURFACE ANTIBODY,QUALITATIVE: Hep B S Ab: POSITIVE — AB

## 2013-10-09 LAB — PSA: PSA: <0.01 ng/mL (ref <=4.00)

## 2013-10-09 LAB — INSULIN, FASTING: INSULIN FASTING, SERUM: 15 u[IU]/mL (ref 3–28)

## 2013-10-09 LAB — MICROALBUMIN / CREATININE URINE RATIO
Creatinine, Urine: 115.5 mg/dL
MICROALB/CREAT RATIO: 19.5 mg/g (ref 0.0–30.0)
Microalb, Ur: 2.25 mg/dL — ABNORMAL HIGH (ref 0.00–1.89)

## 2013-10-09 LAB — HEPATITIS B CORE ANTIBODY, TOTAL: Hep B Core Total Ab: REACTIVE — AB

## 2013-10-09 LAB — HEPATITIS C ANTIBODY: HCV AB: NEGATIVE

## 2013-10-09 LAB — TSH: TSH: 1.435 u[IU]/mL (ref 0.350–4.500)

## 2013-10-09 LAB — HIV ANTIBODY (ROUTINE TESTING W REFLEX): HIV: NONREACTIVE

## 2013-10-10 ENCOUNTER — Other Ambulatory Visit: Payer: Self-pay | Admitting: Internal Medicine

## 2013-10-10 MED ORDER — CIPROFLOXACIN HCL 250 MG PO TABS: 250.0000 mg | ORAL_TABLET | Freq: Two times a day (BID) | ORAL | Status: AC

## 2013-10-12 LAB — HEPATITIS B E ANTIBODY: Hepatitis Be Antibody: NONREACTIVE

## 2013-11-27 ENCOUNTER — Other Ambulatory Visit: Payer: Self-pay | Admitting: Physician Assistant

## 2013-12-25 ENCOUNTER — Ambulatory Visit (INDEPENDENT_AMBULATORY_CARE_PROVIDER_SITE_OTHER): Admitting: Physician Assistant

## 2013-12-25 ENCOUNTER — Encounter: Payer: Self-pay | Admitting: Physician Assistant

## 2013-12-25 VITALS — BP 138/80 | HR 76 | Temp 97.9°F | Resp 16 | Ht 66.0 in | Wt 190.0 lb

## 2013-12-25 DIAGNOSIS — J029 Acute pharyngitis, unspecified: Secondary | ICD-10-CM

## 2013-12-25 MED ORDER — AZITHROMYCIN 250 MG PO TABS
ORAL_TABLET | ORAL | Status: AC
Start: 2013-12-25 — End: 2013-12-30

## 2013-12-25 NOTE — Progress Notes (Signed)
   Subjective:    Patient ID: Cody Zamora, male    DOB: 01/27/1951, 63 y.o.   MRN: [id number]  Sore Throat  This is a new problem. Episode onset: 5 days. The problem has been gradually worsening. There has been no fever. Associated symptoms include congestion, coughing, a hoarse voice, neck pain, shortness of breath and swollen glands. Pertinent negatives include no abdominal pain, diarrhea, drooling, ear discharge, ear pain, headaches, plugged ear sensation, stridor, trouble swallowing or vomiting. Treatments tried: flonase, tylenol.      Review of Systems  Constitutional: Positive for fever and chills. Negative for fatigue.  HENT: Positive for congestion, hoarse voice, rhinorrhea and sore throat. Negative for drooling, ear discharge, ear pain, mouth sores, nosebleeds, postnasal drip, sinus pressure and trouble swallowing.   Eyes: Negative.   Respiratory: Positive for cough and shortness of breath. Negative for chest tightness and stridor.   Cardiovascular: Negative.   Gastrointestinal: Negative.  Negative for vomiting, abdominal pain and diarrhea.  Genitourinary: Negative.   Musculoskeletal: Positive for neck pain.  Neurological: Negative.  Negative for headaches.       Objective:   Physical Exam  Constitutional: He appears well-developed and well-nourished.  HENT:  Head: Normocephalic and atraumatic.  Right Ear: External ear normal.  Left Ear: External ear normal.  Mouth/Throat: Uvula is midline and mucous membranes are normal. Posterior oropharyngeal edema and posterior oropharyngeal erythema present.  Eyes: Conjunctivae and EOM are normal. Pupils are equal, round, and reactive to light.  Neck: Normal range of motion. Neck supple.  Cardiovascular: Normal rate, regular rhythm and normal heart sounds.   Pulmonary/Chest: Effort normal and breath sounds normal.  Abdominal: Soft. Bowel sounds are normal.  Lymphadenopathy:    He has cervical adenopathy.  Skin: Skin is warm and  dry.      Assessment & Plan:  1. Acute pharyngitis, unspecified pharyngitis type [462] Pharyngitis: Salt water gargles. You can also do 1 TSP liquid Maalox and 1 TSP liquid benadryl- mix/ gargle/ spit - azithromycin (ZITHROMAX) 250 MG tablet; Take 2 tablets (500 mg) on  Day 1,  followed by 1 tablet (250 mg) once daily on Days 2 through 5.  Dispense: 6 each; Refill: 1

## 2013-12-25 NOTE — Patient Instructions (Signed)

## 2013-12-26 ENCOUNTER — Telehealth: Payer: Self-pay | Admitting: *Deleted

## 2013-12-26 NOTE — Telephone Encounter (Signed)
Patient aware of instructions per Vicie Mutters, PA , but he declines RX for nausea, states he is feeling a little better

## 2013-12-26 NOTE — Telephone Encounter (Signed)
Pt is calling says he developed vomiting,diarrhea yesterday.  Pt said he was to call & let you know how he was & if he developed anything else. *please advise*

## 2014-01-16 ENCOUNTER — Ambulatory Visit: Payer: Self-pay | Admitting: Physician Assistant

## 2014-02-14 ENCOUNTER — Ambulatory Visit: Payer: Self-pay | Admitting: Internal Medicine

## 2014-02-26 ENCOUNTER — Other Ambulatory Visit: Payer: Self-pay | Admitting: *Deleted

## 2014-02-26 ENCOUNTER — Ambulatory Visit (INDEPENDENT_AMBULATORY_CARE_PROVIDER_SITE_OTHER): Payer: Medicare Other | Admitting: Internal Medicine

## 2014-02-26 ENCOUNTER — Encounter: Payer: Self-pay | Admitting: Internal Medicine

## 2014-02-26 VITALS — BP 114/76 | HR 68 | Temp 97.9°F | Resp 16 | Ht 66.0 in | Wt 191.5 lb

## 2014-02-26 DIAGNOSIS — E782 Mixed hyperlipidemia: Secondary | ICD-10-CM

## 2014-02-26 DIAGNOSIS — I10 Essential (primary) hypertension: Secondary | ICD-10-CM | POA: Diagnosis not present

## 2014-02-26 DIAGNOSIS — Z79899 Other long term (current) drug therapy: Secondary | ICD-10-CM | POA: Diagnosis not present

## 2014-02-26 DIAGNOSIS — E559 Vitamin D deficiency, unspecified: Secondary | ICD-10-CM

## 2014-02-26 DIAGNOSIS — N189 Chronic kidney disease, unspecified: Secondary | ICD-10-CM | POA: Diagnosis not present

## 2014-02-26 DIAGNOSIS — E1122 Type 2 diabetes mellitus with diabetic chronic kidney disease: Secondary | ICD-10-CM | POA: Diagnosis not present

## 2014-02-26 DIAGNOSIS — Z23 Encounter for immunization: Secondary | ICD-10-CM | POA: Diagnosis not present

## 2014-02-26 DIAGNOSIS — E1129 Type 2 diabetes mellitus with other diabetic kidney complication: Secondary | ICD-10-CM | POA: Diagnosis not present

## 2014-02-26 LAB — BASIC METABOLIC PANEL WITH GFR
BUN: 13 mg/dL (ref 6–23)
CHLORIDE: 103 meq/L (ref 96–112)
CO2: 29 mEq/L (ref 19–32)
Calcium: 9.7 mg/dL (ref 8.4–10.5)
Creat: 1.16 mg/dL (ref 0.50–1.35)
GFR, EST AFRICAN AMERICAN: 76 mL/min
GFR, Est Non African American: 66 mL/min
Glucose, Bld: 97 mg/dL (ref 70–99)
POTASSIUM: 4.2 meq/L (ref 3.5–5.3)
Sodium: 138 mEq/L (ref 135–145)

## 2014-02-26 LAB — MAGNESIUM: Magnesium: 1.9 mg/dL (ref 1.5–2.5)

## 2014-02-26 LAB — LIPID PANEL
CHOLESTEROL: 124 mg/dL (ref 0–200)
HDL: 36 mg/dL — AB (ref >39)
LDL CALC: 65 mg/dL (ref 0–99)
Total CHOL/HDL Ratio: 3.4 Ratio
Triglycerides: 116 mg/dL (ref <150)
VLDL: 23 mg/dL (ref 0–40)

## 2014-02-26 LAB — HEPATIC FUNCTION PANEL
ALK PHOS: 75 U/L (ref 39–117)
ALT: 24 U/L (ref 0–53)
AST: 28 U/L (ref 0–37)
Albumin: 4.6 g/dL (ref 3.5–5.2)
BILIRUBIN INDIRECT: 0.4 mg/dL (ref 0.2–1.2)
BILIRUBIN TOTAL: 0.5 mg/dL (ref 0.2–1.2)
Bilirubin, Direct: 0.1 mg/dL (ref 0.0–0.3)
Total Protein: 7.5 g/dL (ref 6.0–8.3)

## 2014-02-26 LAB — TSH: TSH: 1.181 u[IU]/mL (ref 0.350–4.500)

## 2014-02-26 MED ORDER — EZETIMIBE 10 MG PO TABS: ORAL_TABLET | ORAL | Status: DC

## 2014-02-26 MED ORDER — VARDENAFIL HCL 20 MG PO TABS: 20.0000 mg | ORAL_TABLET | Freq: Every day | ORAL | Status: DC | PRN

## 2014-02-26 MED ORDER — METFORMIN HCL ER 500 MG PO TB24: ORAL_TABLET | ORAL | Status: DC

## 2014-02-26 MED ORDER — VALSARTAN 320 MG PO TABS: 320.0000 mg | ORAL_TABLET | Freq: Every day | ORAL | Status: DC

## 2014-02-26 NOTE — Progress Notes (Signed)
Patient ID: Cody Zamora, male   DOB: [birth year], 63 y.o.   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    Patient is treated for HTN & BP has been controlled at home. Today's BP: 114/76 mmHg. Patient has had no complaints of any cardiac type chest pain, palpitations, dyspnea/orthopnea/PND, dizziness, claudication, or dependent edema.   Hyperlipidemia is controlled with diet & meds. Patient denies myalgias or other med SE's. Last Lipids were at goal - Total Chol 123; HDL  39*; LDL  70; Trig 72 on  10/08/2013.   Also, the patient has history of T2_NIDDM and has had no symptoms of reactive hypoglycemia, diabetic polys, paresthesias or visual blurring.  Last A1c was  5.7% on  10/08/2013.   Further, the patient also has history of Vitamin D Deficiency and supplements vitamin D without any suspected side-effects. Last vitamin D was  on   Medication List   BENEFIBER DRINK MIX PO  Take by mouth. Drinks 1 packet daily     FISH OIL PO  Take by mouth daily.     FLONASE 50 MCG/ACT nasal spray  Generic drug:  fluticasone  Place 1 spray into both nostrils daily.     metFORMIN 500 MG 24 hr tablet  Commonly known as:  GLUCOPHAGE-XR  TAKE 2 TABLETS TWICE A DAY WITH MEALS AS DIRECTED     multivitamin with minerals tablet  Take 1 tablet by mouth daily.     valsartan 320 MG tablet  Commonly known as:  DIOVAN  Take 1 tablet (320 mg total) by mouth daily.     vardenafil 20 MG tablet  Commonly known as:  LEVITRA  Take 1 tablet (20 mg total) by mouth daily as needed for erectile dysfunction.     Vitamin D3 2000 UNITS capsule  Take 6,000 Units by mouth daily.     ZETIA 10 MG tablet  Generic drug:  ezetimibe  TAKE 1 TABLET DAILY     Allergies  Allergen Reactions  . Cialis [Tadalafil] Other (See Comments)    Hot flashes  . Fructose Nausea And Vomiting  . Milk-Related Compounds   .  Morphine And Related Itching  . Ppd [Tuberculin Purified Protein Derivative]     Positive 09-2004   PMHx:   Past Medical History  Diagnosis Date  . Allergy     fructose  . Cancer     prostate  . Hyperlipidemia   . Hypertension   . Chronic kidney disease     huge kidney stones  . Pre-diabetes   . Hypogonadism male   . Diabetes mellitus without complication 2683  . DM (diabetes mellitus) 07/09/2013   Immunization History  Administered Date(s) Administered  . DTaP 02/17/2002  . Influenza Whole 01/08/2013  . Influenza, High Dose Seasonal PF 02/26/2014  . Pneumococcal Polysaccharide-23 02/18/1996  . Tdap 06/29/2011  . Zoster 03/23/2011   Past Surgical History  Procedure Laterality Date  . Finger amputation      left Middle  . Kidney stone surgery      twice  . Robot assisted laparoscopic radical prostatectomy  2006  . Colonoscopy     FHx:    Reviewed / unchanged  SHx:    Reviewed / unchanged  Systems Review:  Constitutional: Denies fever, chills, wt changes, headaches, insomnia, fatigue, night sweats, change in appetite. Eyes: Denies redness, blurred vision, diplopia, discharge, itchy, watery eyes.  ENT:  Denies discharge, congestion, post nasal drip, epistaxis, sore throat, earache, hearing loss, dental pain, tinnitus, vertigo, sinus pain, snoring.  CV: Denies chest pain, palpitations, irregular heartbeat, syncope, dyspnea, diaphoresis, orthopnea, PND, claudication or edema. Respiratory: denies cough, dyspnea, DOE, pleurisy, hoarseness, laryngitis, wheezing.  Gastrointestinal: Denies dysphagia, odynophagia, heartburn, reflux, water brash, abdominal pain or cramps, nausea, vomiting, bloating, diarrhea, constipation, hematemesis, melena, hematochezia  or hemorrhoids. Genitourinary: Denies dysuria, frequency, urgency, nocturia, hesitancy, discharge, hematuria or flank pain. Musculoskeletal: Denies arthralgias, myalgias, stiffness, jt. swelling, pain, limping or strain/sprain.   Skin: Denies pruritus, rash, hives, warts, acne, eczema or change in skin lesion(s). Neuro: No weakness, tremor, incoordination, spasms, paresthesia or pain. Psychiatric: Denies confusion, memory loss or sensory loss. Endo: Denies change in weight, skin or hair change.  Heme/Lymph: No excessive bleeding, bruising or enlarged lymph nodes.  Exam:  BP 114/76 mmHg  Pulse 68  Temp(Src) 97.9 F (36.6 C)  Resp 16  Ht 5\' 6"  (1.676 m)  Wt 191 lb 8 oz (86.864 kg)  BMI 30.92 kg/m2  Appears well nourished and in no distress. Eyes: PERRLA, EOMs, conjunctiva no swelling or erythema. Sinuses: No frontal/maxillary tenderness ENT/Mouth: EAC's clear, TM's nl w/o erythema, bulging. Nares clear w/o erythema, swelling, exudates. Oropharynx clear without erythema or exudates. Oral hygiene is good. Tongue normal, non obstructing. Hearing intact.  Neck: Supple. Thyroid nl. Car 2+/2+ without bruits, nodes or JVD. Chest: Respirations nl with BS clear & equal w/o rales, rhonchi, wheezing or stridor.  Cor: Heart sounds normal w/ regular rate and rhythm without sig. murmurs, gallops, clicks, or rubs. Peripheral pulses normal and equal  without edema.  Abdomen: Soft & bowel sounds normal. Non-tender w/o guarding, rebound, hernias, masses, or organomegaly.  Lymphatics: Unremarkable.  Musculoskeletal: Full ROM all peripheral extremities, joint stability, 5/5 strength, and normal gait.  Skin: Warm, dry without exposed rashes, lesions or ecchymosis apparent.  Neuro: Cranial nerves intact, reflexes equal bilaterally. Sensory-motor testing grossly intact. Tendon reflexes grossly intact.  Pysch: Alert & oriented x 3.  Insight and judgement nl & appropriate. No ideations.  Assessment and Plan:  1. Hypertension - Continue monitor blood pressure at home. Continue diet/meds same.  2. Hyperlipidemia - Continue diet/meds, exercise,& lifestyle modifications. Continue monitor periodic cholesterol/liver & renal functions    3. T2_NIDDM w/CKD2 - Continue diet, exercise, lifestyle modifications. Monitor appropriate labs.  4. Vitamin D Deficiency - Continue supplementation.  5. Hx/o Prostate Cancer -    Recommended regular exercise, BP monitoring, weight control, and discussed med and SE's. Recommended labs to assess and monitor clinical status. Further disposition pending results of labs.

## 2014-02-26 NOTE — Patient Instructions (Signed)

## 2014-02-27 LAB — CBC WITH DIFFERENTIAL/PLATELET
BASOS ABS: 0 10*3/uL (ref 0.0–0.1)
Basophils Relative: 0 % (ref 0–1)
Eosinophils Absolute: 0.1 10*3/uL (ref 0.0–0.7)
Eosinophils Relative: 2 % (ref 0–5)
HCT: 45 % (ref 39.0–52.0)
Hemoglobin: 15.7 g/dL (ref 13.0–17.0)
LYMPHS PCT: 46 % (ref 12–46)
Lymphs Abs: 3.3 10*3/uL (ref 0.7–4.0)
MCH: 30 pg (ref 26.0–34.0)
MCHC: 34.9 g/dL (ref 30.0–36.0)
MCV: 86 fL (ref 78.0–100.0)
Monocytes Absolute: 0.4 10*3/uL (ref 0.1–1.0)
Monocytes Relative: 6 % (ref 3–12)
NEUTROS ABS: 3.3 10*3/uL (ref 1.7–7.7)
Neutrophils Relative %: 46 % (ref 43–77)
PLATELETS: 256 10*3/uL (ref 150–400)
RBC: 5.23 MIL/uL (ref 4.22–5.81)
RDW: 14.6 % (ref 11.5–15.5)
WBC: 7.2 10*3/uL (ref 4.0–10.5)

## 2014-02-27 LAB — VITAMIN D 25 HYDROXY (VIT D DEFICIENCY, FRACTURES): Vit D, 25-Hydroxy: 73 ng/mL (ref 30–89)

## 2014-02-27 LAB — HEMOGLOBIN A1C
Hgb A1c MFr Bld: 6.1 % — ABNORMAL HIGH (ref <5.7)
Mean Plasma Glucose: 128 mg/dL — ABNORMAL HIGH (ref <117)

## 2014-02-27 LAB — INSULIN, FASTING: INSULIN FASTING, SERUM: 19.5 u[IU]/mL (ref 2.0–19.6)

## 2014-04-26 ENCOUNTER — Ambulatory Visit: Payer: Self-pay | Admitting: Internal Medicine

## 2014-06-06 ENCOUNTER — Ambulatory Visit (INDEPENDENT_AMBULATORY_CARE_PROVIDER_SITE_OTHER): Payer: Medicare Other | Admitting: Internal Medicine

## 2014-06-06 ENCOUNTER — Encounter: Payer: Self-pay | Admitting: Internal Medicine

## 2014-06-06 VITALS — BP 118/70 | HR 60 | Temp 98.2°F | Resp 16 | Ht 66.0 in | Wt 189.0 lb

## 2014-06-06 DIAGNOSIS — E782 Mixed hyperlipidemia: Secondary | ICD-10-CM | POA: Diagnosis not present

## 2014-06-06 DIAGNOSIS — Z0000 Encounter for general adult medical examination without abnormal findings: Secondary | ICD-10-CM | POA: Diagnosis not present

## 2014-06-06 DIAGNOSIS — Z9181 History of falling: Secondary | ICD-10-CM

## 2014-06-06 DIAGNOSIS — C61 Malignant neoplasm of prostate: Secondary | ICD-10-CM

## 2014-06-06 DIAGNOSIS — I10 Essential (primary) hypertension: Secondary | ICD-10-CM | POA: Diagnosis not present

## 2014-06-06 DIAGNOSIS — E559 Vitamin D deficiency, unspecified: Secondary | ICD-10-CM

## 2014-06-06 DIAGNOSIS — E1129 Type 2 diabetes mellitus with other diabetic kidney complication: Secondary | ICD-10-CM

## 2014-06-06 DIAGNOSIS — Z1331 Encounter for screening for depression: Secondary | ICD-10-CM

## 2014-06-06 DIAGNOSIS — Z79899 Other long term (current) drug therapy: Secondary | ICD-10-CM

## 2014-06-06 NOTE — Progress Notes (Signed)
Patient ID: Cody Zamora, male   DOB: 07/10/1950, 64 y.o.   MRN: [id number]  MEDICARE ANNUAL WELLNESS VISIT AND OV  Assessment:   1. Essential hypertension  - TSH  2. Mixed hyperlipidemia  - Lipid panel  3. Type 2 diabetes mellitus with other diabetic kidney complication  - Hemoglobin A1c - Insulin, fasting  4. Vitamin D deficiency  - Vit D  25 hydroxy (rtn osteoporosis monitoring)  5. Malignant neoplasm of prostate   6. Medication management  - CBC with Differential/Platelet - BASIC METABOLIC PANEL WITH GFR - Hepatic function panel - Magnesium  Plan:   During the course of the visit the patient was educated and counseled about appropriate screening and preventive services including:    Pneumococcal vaccine   Influenza vaccine  Td vaccine  Screening electrocardiogram  Bone densitometry screening  Colorectal cancer screening  Diabetes screening  Glaucoma screening  Nutrition counseling   Advanced directives: requested  Screening recommendations, referrals: Vaccinations: Immunization History  Administered Date(s) Administered  . DTaP 02/17/2002  . Influenza Whole 01/08/2013  . Influenza, High Dose Seasonal PF 02/26/2014  . Pneumococcal Polysaccharide-23 02/18/1996  . Tdap 06/29/2011  . Zoster 03/23/2011   Prevnar vaccine declined Hep B vaccine not indicated  Nutrition assessed and recommended  Colonoscopy 10/28/2010 Recommended yearly ophthalmology/optometry visit for glaucoma screening and checkup Recommended yearly dental visit for hygiene and checkup Advanced directives - no - offored forms  Conditions/risks identified: BMI: Discussed weight loss, diet, and increase physical activity.  Increase physical activity: AHA recommends 150 minutes of physical activity a week.  Medications reviewed Diabetes is not at goal, ACE/ARB therapy: Yes. Urinary Incontinence is not an issue: discussed non pharmacology and pharmacology options.  Fall  risk: low- discussed PT, home fall assessment, medications.   Subjective:  Cody Zamora  presents for [facility] Annual Wellness Visit and OV.  No prior [facility] AWV.  He has had elevated blood pressure since 1996. His blood pressure has been controlled at home, today their BP is BP: 118/70 mmHg He does not workout. He denies chest pain, shortness of breath, dizziness.  He is on cholesterol medication and denies myalgias. His cholesterol is at goal. The cholesterol last visit was:  Lab Results  Component Value Date   CHOL 124 02/26/2014   HDL 36* 02/26/2014   LDLCALC 65 02/26/2014   TRIG 116 02/26/2014   CHOLHDL 3.4 02/26/2014   He has had diabetes for 5 years since Aug 2011 with an A1c 6.5%.  He has not been working on diet and exercise for diabetes, and denies foot ulcerations, hyperglycemia, hypoglycemia , paresthesia of the feet, polydipsia, polyuria and visual disturbances. Last A1C in the office was:  Lab Results  Component Value Date   HGBA1C 6.1* 02/26/2014   Patient is on Vitamin D supplement.   (Vit D was 11 in 2008)  Lab Results  Component Value Date   VD25OH 73 02/26/2014      Names of Other Physician/Practitioners you currently use: 1. [facility] here for primary care 2. Dr Frederico Hamman, eye doctor, last visit 2015 & Feb 2016 3. Dr Lara Mulch, dentist, last visit Jan 2016  Patient Care Team: [name], MD as PCP - General (Internal Medicine) Arvil Persons, MD as Consulting Physician (Urology) Inda Castle, MD as Consulting Physician (Gastroenterology)  Medication Review: Medication Sig  . aspirin 81 MG tablet Take 81 mg by mouth daily.    Marland Kitchen VITAMIN D 2000 UNITS  Take  6,000 Units by mouth daily.  Marland Kitchen ezetimibe (ZETIA) 10 MG tablet TAKE 1 TABLET DAILY  . fluticasone (FLONASE)nasal  Place 1 spray into both nostrils daily.  . metFORMIN -XR)500 MG 24 hr tab TAKE 2 TABLETS TWICE A DAY WITH MEAL  .  (MULTIVITAMIN WITH MINERALS)   Take 1 tablet by mouth daily.    . Omega-3 Fatty Acids (FISH OIL PO) Take by mouth daily.  . valsartan (DIOVAN) 320 MG tablet Take 1 tablet (320 mg total) by mouth daily.  . vardenafil (LEVITRA) 20 MG tablet Take 1 tablet (20 mg total) by mouth daily as needed for erectile dysfunction.  Bonne Dolores DRINK MIX Take by mouth. Drinks 1 packet daily   Current Problems (verified) Patient Active Problem List   Diagnosis Date Noted  . Medication management 10/06/2013  . 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 Essential hypertension 04/06/2013  . Mixed hyperlipidemia 04/06/2013  . Vitamin D deficiency 04/06/2013  . Nephrolithiasis 04/06/2013  . Malignant neoplasm of prostate 04/06/2013   Screening Tests Health Maintenance  Topic Date Due  . OPHTHALMOLOGY EXAM  12/15/1958  . HEMOGLOBIN A1C  08/27/2014  . FOOT EXAM  10/09/2014  . URINE MICROALBUMIN  10/09/2014  . INFLUENZA VACCINE  11/18/2014  . COLONOSCOPY  10/27/2020  . TETANUS/TDAP  06/28/2021  . PNEUMOCOCCAL POLYSACCHARIDE VACCINE AGE 63 AND OVER  Completed  . ZOSTAVAX  Completed    Immunization History  Administered Date(s) Administered  . DTaP 02/17/2002  . Influenza Whole 01/08/2013  . Influenza, High Dose Seasonal PF 02/26/2014  . Pneumococcal Polysaccharide-23 02/18/1996  . Tdap 06/29/2011  . Zoster 03/23/2011    Preventative care: Last colonoscopy: 10/28/2010 - Dr Deatra Ina - 60 yr f/u.   History reviewed: allergies, current medications, past family history, past medical history, past social history, past surgical history and problem list  Risk Factors: Tobacco History  Substance Use Topics  . Smoking status: Never Smoker   . Smokeless tobacco: Never Used  . Alcohol Use: Yes     Comment: rarely   He does not smoke.  Patient is not a former smoker. Are there smokers in your home (other than you)?  No  Alcohol Current alcohol use: rarely  Caffeine Current caffeine use: tea 1-2 /wk and caffeinated  soft drinks 1-2 /wk  Exercise Current exercise: cardiovascular workout on exercise equipment  Nutrition/Diet Current diet: in general, a "healthy" diet    Cardiac risk factors: advanced age (older than 43 for men, 61 for women), dyslipidemia, hypertension, male gender, obesity (BMI >= 30 kg/m2) and sedentary lifestyle.  Depression Screen (Note: if answer to either of the following is "Yes", a more complete depression screening is indicated)   Q1: Over the past two weeks, have you felt down, depressed or hopeless? No  Q2: Over the past two weeks, have you felt little interest or pleasure in doing things? No  Have you lost interest or pleasure in daily life? No  Do you often feel hopeless? No  Do you cry easily over simple problems? No  Activities of Daily Living In your present state of health, do you have any difficulty performing the following activities?:  Driving? No Managing money?  No Feeding yourself? No Getting from bed to chair? No Climbing a flight of stairs? No Preparing food and eating?: No Bathing or showering? No Getting dressed: No Getting to the toilet? No Using the toilet:No Moving around from place to place: No In the past year have you fallen  or had a near fall?:No   Are you sexually active?  No (c/o ED) discussed his request for referral for a penile pump.  Do you have more than one partner?  No  Vision Difficulties: No  Hearing Difficulties: No Do you often ask people to speak up or repeat themselves? No Do you experience ringing or noises in your ears? No Do you have difficulty understanding soft or whispered voices? No  Cognition  Do you feel that you have a problem with memory?No  Do you often misplace items? No  Do you feel safe at home?  Yes  Advanced directives Does patient have a [place]? No - offered forms Does patient have a Living Will? No - offered forms  Past Medical History  Diagnosis Date  . Allergy      fructose  . Cancer     prostate  . Hyperlipidemia   . Hypertension   . Chronic kidney disease     huge kidney stones  . Pre-diabetes   . Hypogonadism male   . Diabetes mellitus without complication 8563  . DM (diabetes mellitus) 07/09/2013    Past Surgical History  Procedure Laterality Date  . Finger amputation      left Middle  . Kidney stone surgery      twice  . Robot assisted laparoscopic radical prostatectomy  2006  . Colonoscopy      ROS: Constitutional: Denies fever, chills, weight loss/gain, headaches, insomnia, fatigue, night sweats or change in appetite. Eyes: Denies redness, blurred vision, diplopia, discharge, itchy or watery eyes.  ENT: Denies discharge, congestion, post nasal drip, epistaxis, sore throat, earache, hearing loss, dental pain, Tinnitus, Vertigo, Sinus pain or snoring.  Cardio: Denies chest pain, palpitations, irregular heartbeat, syncope, dyspnea, diaphoresis, orthopnea, PND, claudication or edema Respiratory: denies cough, dyspnea, DOE, pleurisy, hoarseness, laryngitis or wheezing.  Gastrointestinal: Denies dysphagia, heartburn, reflux, water brash, pain, cramps, nausea, vomiting, bloating, diarrhea, constipation, hematemesis, melena, hematochezia, jaundice or hemorrhoids Genitourinary: Denies dysuria, frequency, urgency, nocturia, hesitancy, discharge, hematuria or flank pain Musculoskeletal: Denies arthralgia, myalgia, stiffness, Jt. Swelling, pain, limp or strain/sprain. Denies Falls. Skin: Denies puritis, rash, hives, warts, acne, eczema or change in skin lesion Neuro: No weakness, tremor, incoordination, spasms, paresthesia or pain Psychiatric: Denies confusion, memory loss or sensory loss. Denies Depression. Endocrine: Denies change in weight, skin, hair change, nocturia, and paresthesia, diabetic polys, visual blurring or hyper / hypo glycemic episodes.  Heme/Lymph: No excessive bleeding, bruising or enlarged lymph nodes.  Objective:     BP  118/70   Pulse 60  Temp 98.2 F   Resp 16  Ht 5\' 6"    Wt 189 lb     BMI 30.52   General Appearance:  Alert  WD/WN, male , in no apparent distress. Eyes: PERRLA, EOMs, conjunctiva no swelling or erythema, normal fundi and vessels. Sinuses: No frontal/maxillary tenderness ENT/Mouth: EACs patent / TMs  nl. Nares clear without erythema, swelling, mucoid exudates. Oral hygiene is good. No erythema, swelling, or exudate. Tongue normal, non-obstructing. Tonsils not swollen or erythematous. Hearing normal.  Neck: Supple, thyroid normal. No bruits, nodes or JVD. Respiratory: Respiratory effort normal.  BS equal and clear bilateral without rales, rhonci, wheezing or stridor. Cardio: Heart sounds are normal with regular rate and rhythm and no murmurs, rubs or gallops. Peripheral pulses are normal and equal bilaterally without edema. No aortic or femoral bruits. Chest: symmetric with normal excursions and percussion.  Abdomen: Flat, soft, with nl bowel sounds. Nontender, no  guarding, rebound, hernias, masses, or organomegaly.  Musculoskeletal: Full ROM all peripheral extremities, joint stability, 5/5 strength, and normal gait. Skin: Warm and dry without rashes, lesions, cyanosis, clubbing or  ecchymosis.  Neuro: Cranial nerves intact, reflexes equal bilaterally. Normal muscle tone, no cerebellar symptoms. Sensation intact.  Pysch: Awake and oriented X 3 with normal affect, insight and judgment appropriate.   Cognitive Testing  Alert? Yes  Normal Appearance? Yes  Oriented to person? Yes  Place? Yes   Time? Yes  Recall of three objects?  Yes  Can perform simple calculations? Yes  Displays appropriate judgment? Yes  Can read the correct time from a watch/clock? Yes  Medicare Attestation I have personally reviewed: The patient's medical and social history Their use of alcohol, tobacco or illicit drugs Their current medications and supplements The patient's functional ability including ADLs,fall  risks, home safety risks, cognitive, and hearing and visual impairment Diet and physical activities Evidence for depression or mood disorders  The patient's weight, height, BMI, and visual acuity have been recorded in the chart.  I have made referrals, counseling, and provided education to the patient based on review of the above and I have provided the patient with a written personalized care plan for preventive services.    Breyanna Valera DAVID, MD   06/06/2014

## 2014-06-06 NOTE — Patient Instructions (Signed)

## 2014-06-07 LAB — LIPID PANEL
CHOL/HDL RATIO: 4 ratio
Cholesterol: 148 mg/dL (ref 0–200)
HDL: 37 mg/dL — AB (ref >39)
LDL Cholesterol: 82 mg/dL (ref 0–99)
Triglycerides: 147 mg/dL (ref <150)
VLDL: 29 mg/dL (ref 0–40)

## 2014-06-07 LAB — HEPATIC FUNCTION PANEL
ALBUMIN: 4.6 g/dL (ref 3.5–5.2)
ALT: 17 U/L (ref 0–53)
AST: 22 U/L (ref 0–37)
Alkaline Phosphatase: 69 U/L (ref 39–117)
BILIRUBIN DIRECT: 0.1 mg/dL (ref 0.0–0.3)
BILIRUBIN TOTAL: 0.4 mg/dL (ref 0.2–1.2)
Indirect Bilirubin: 0.3 mg/dL (ref 0.2–1.2)
Total Protein: 7.8 g/dL (ref 6.0–8.3)

## 2014-06-07 LAB — BASIC METABOLIC PANEL WITH GFR
BUN: 15 mg/dL (ref 6–23)
CO2: 28 meq/L (ref 19–32)
Calcium: 9.8 mg/dL (ref 8.4–10.5)
Chloride: 101 mEq/L (ref 96–112)
Creat: 1.12 mg/dL (ref 0.50–1.35)
GFR, Est African American: 79 mL/min
GFR, Est Non African American: 69 mL/min
GLUCOSE: 89 mg/dL (ref 70–99)
POTASSIUM: 4.5 meq/L (ref 3.5–5.3)
Sodium: 136 mEq/L (ref 135–145)

## 2014-06-07 LAB — CBC WITH DIFFERENTIAL/PLATELET
Basophils Absolute: 0 10*3/uL (ref 0.0–0.1)
Basophils Relative: 0 % (ref 0–1)
Eosinophils Absolute: 0.1 10*3/uL (ref 0.0–0.7)
Eosinophils Relative: 1 % (ref 0–5)
HCT: 47.9 % (ref 39.0–52.0)
HEMOGLOBIN: 16.1 g/dL (ref 13.0–17.0)
LYMPHS ABS: 3.2 10*3/uL (ref 0.7–4.0)
LYMPHS PCT: 41 % (ref 12–46)
MCH: 29.5 pg (ref 26.0–34.0)
MCHC: 33.6 g/dL (ref 30.0–36.0)
MCV: 87.9 fL (ref 78.0–100.0)
MONOS PCT: 5 % (ref 3–12)
MPV: 8.7 fL (ref 8.6–12.4)
Monocytes Absolute: 0.4 10*3/uL (ref 0.1–1.0)
NEUTROS PCT: 53 % (ref 43–77)
Neutro Abs: 4.2 10*3/uL (ref 1.7–7.7)
Platelets: 273 10*3/uL (ref 150–400)
RBC: 5.45 MIL/uL (ref 4.22–5.81)
RDW: 14.3 % (ref 11.5–15.5)
WBC: 7.9 10*3/uL (ref 4.0–10.5)

## 2014-06-07 LAB — VITAMIN D 25 HYDROXY (VIT D DEFICIENCY, FRACTURES): Vit D, 25-Hydroxy: 63 ng/mL (ref 30–100)

## 2014-06-07 LAB — TSH: TSH: 1.281 u[IU]/mL (ref 0.350–4.500)

## 2014-06-07 LAB — MAGNESIUM: MAGNESIUM: 1.8 mg/dL (ref 1.5–2.5)

## 2014-06-07 LAB — HEMOGLOBIN A1C
Hgb A1c MFr Bld: 6 % — ABNORMAL HIGH (ref <5.7)
Mean Plasma Glucose: 126 mg/dL — ABNORMAL HIGH (ref <117)

## 2014-06-07 LAB — INSULIN, FASTING: INSULIN FASTING, SERUM: 13.5 u[IU]/mL (ref 2.0–19.6)

## 2014-07-30 DIAGNOSIS — C61 Malignant neoplasm of prostate: Secondary | ICD-10-CM | POA: Diagnosis not present

## 2014-07-30 DIAGNOSIS — Z87442 Personal history of urinary calculi: Secondary | ICD-10-CM | POA: Diagnosis not present

## 2014-07-30 DIAGNOSIS — N529 Male erectile dysfunction, unspecified: Secondary | ICD-10-CM | POA: Diagnosis not present

## 2014-08-06 DIAGNOSIS — N5231 Erectile dysfunction following radical prostatectomy: Secondary | ICD-10-CM | POA: Diagnosis not present

## 2014-09-02 ENCOUNTER — Other Ambulatory Visit: Payer: Self-pay | Admitting: Urology

## 2014-10-14 ENCOUNTER — Ambulatory Visit: Payer: Medicare Other | Admitting: Internal Medicine

## 2014-10-14 NOTE — Progress Notes (Signed)
Patient ID: Cody Zamora, male   DOB: November 07, 1950, 64 y.o.   MRN: [id number]  N  0      S  H  O  W

## 2014-10-15 ENCOUNTER — Other Ambulatory Visit: Payer: Self-pay | Admitting: Internal Medicine

## 2014-10-16 ENCOUNTER — Encounter: Payer: Self-pay | Admitting: Internal Medicine

## 2014-10-16 ENCOUNTER — Ambulatory Visit (INDEPENDENT_AMBULATORY_CARE_PROVIDER_SITE_OTHER): Payer: Medicare Other | Admitting: Internal Medicine

## 2014-10-16 VITALS — BP 114/70 | HR 56 | Temp 97.3°F | Resp 16 | Ht 65.5 in | Wt 185.6 lb

## 2014-10-16 DIAGNOSIS — E1129 Type 2 diabetes mellitus with other diabetic kidney complication: Secondary | ICD-10-CM

## 2014-10-16 DIAGNOSIS — Z9181 History of falling: Secondary | ICD-10-CM

## 2014-10-16 DIAGNOSIS — E559 Vitamin D deficiency, unspecified: Secondary | ICD-10-CM

## 2014-10-16 DIAGNOSIS — R7309 Other abnormal glucose: Secondary | ICD-10-CM

## 2014-10-16 DIAGNOSIS — Z125 Encounter for screening for malignant neoplasm of prostate: Secondary | ICD-10-CM

## 2014-10-16 DIAGNOSIS — I10 Essential (primary) hypertension: Secondary | ICD-10-CM | POA: Diagnosis not present

## 2014-10-16 DIAGNOSIS — N200 Calculus of kidney: Secondary | ICD-10-CM

## 2014-10-16 DIAGNOSIS — Z79899 Other long term (current) drug therapy: Secondary | ICD-10-CM | POA: Diagnosis not present

## 2014-10-16 DIAGNOSIS — Z1331 Encounter for screening for depression: Secondary | ICD-10-CM

## 2014-10-16 DIAGNOSIS — Z6830 Body mass index (BMI) 30.0-30.9, adult: Secondary | ICD-10-CM

## 2014-10-16 DIAGNOSIS — Z1212 Encounter for screening for malignant neoplasm of rectum: Secondary | ICD-10-CM

## 2014-10-16 DIAGNOSIS — E782 Mixed hyperlipidemia: Secondary | ICD-10-CM | POA: Diagnosis not present

## 2014-10-16 DIAGNOSIS — C61 Malignant neoplasm of prostate: Secondary | ICD-10-CM

## 2014-10-16 LAB — CBC WITH DIFFERENTIAL/PLATELET
BASOS ABS: 0 10*3/uL (ref 0.0–0.1)
Basophils Relative: 0 % (ref 0–1)
EOS ABS: 0.2 10*3/uL (ref 0.0–0.7)
Eosinophils Relative: 2 % (ref 0–5)
HCT: 44.9 % (ref 39.0–52.0)
HEMOGLOBIN: 15.4 g/dL (ref 13.0–17.0)
LYMPHS PCT: 42 % (ref 12–46)
Lymphs Abs: 3.3 10*3/uL (ref 0.7–4.0)
MCH: 29.9 pg (ref 26.0–34.0)
MCHC: 34.3 g/dL (ref 30.0–36.0)
MCV: 87.2 fL (ref 78.0–100.0)
MONO ABS: 0.4 10*3/uL (ref 0.1–1.0)
MONOS PCT: 5 % (ref 3–12)
MPV: 9.3 fL (ref 8.6–12.4)
NEUTROS ABS: 4 10*3/uL (ref 1.7–7.7)
Neutrophils Relative %: 51 % (ref 43–77)
PLATELETS: 237 10*3/uL (ref 150–400)
RBC: 5.15 MIL/uL (ref 4.22–5.81)
RDW: 14.2 % (ref 11.5–15.5)
WBC: 7.9 10*3/uL (ref 4.0–10.5)

## 2014-10-16 LAB — HEPATIC FUNCTION PANEL
ALBUMIN: 4.5 g/dL (ref 3.5–5.2)
ALT: 16 U/L (ref 0–53)
AST: 22 U/L (ref 0–37)
Alkaline Phosphatase: 72 U/L (ref 39–117)
BILIRUBIN TOTAL: 0.3 mg/dL (ref 0.2–1.2)
Bilirubin, Direct: 0.1 mg/dL (ref 0.0–0.3)
Indirect Bilirubin: 0.2 mg/dL (ref 0.2–1.2)
TOTAL PROTEIN: 7.7 g/dL (ref 6.0–8.3)

## 2014-10-16 LAB — BASIC METABOLIC PANEL WITH GFR
BUN: 18 mg/dL (ref 6–23)
CO2: 25 mEq/L (ref 19–32)
Calcium: 9.6 mg/dL (ref 8.4–10.5)
Chloride: 102 mEq/L (ref 96–112)
Creat: 1.22 mg/dL (ref 0.50–1.35)
GFR, EST AFRICAN AMERICAN: 71 mL/min
GFR, EST NON AFRICAN AMERICAN: 62 mL/min
Glucose, Bld: 92 mg/dL (ref 70–99)
POTASSIUM: 4.4 meq/L (ref 3.5–5.3)
SODIUM: 139 meq/L (ref 135–145)

## 2014-10-16 LAB — MAGNESIUM: Magnesium: 1.9 mg/dL (ref 1.5–2.5)

## 2014-10-16 LAB — LIPID PANEL
Cholesterol: 123 mg/dL (ref 0–200)
HDL: 33 mg/dL — ABNORMAL LOW (ref >=40)
LDL CALC: 73 mg/dL (ref 0–99)
Total CHOL/HDL Ratio: 3.7 Ratio
Triglycerides: 86 mg/dL (ref <150)
VLDL: 17 mg/dL (ref 0–40)

## 2014-10-16 LAB — HEMOGLOBIN A1C
Hgb A1c MFr Bld: 6 % — ABNORMAL HIGH (ref <5.7)
Mean Plasma Glucose: 126 mg/dL — ABNORMAL HIGH (ref <117)

## 2014-10-16 LAB — TSH: TSH: 1.44 u[IU]/mL (ref 0.350–4.500)

## 2014-10-16 NOTE — Progress Notes (Signed)
Patient ID: Cody Zamora, male   DOB: 1951/01/20, 64 y.o.   MRN: [id number]   Annual Comprehensive Examination  This very nice 64 y.o. MBM presents for complete physical.  Patient has been followed for HTN, T2_NIDDM, Hyperlipidemia, and Vitamin D Deficiency. Patient also had Radical Prostatectomy  in 2008 for Prostate cancer and is scheduled for Penile Pump  implant in several weeks by Dr Karsten Ro.    HTN predates since 1996. Patient's BP has been controlled at home.Today's BP: 114/70 mmHg. Patient denies any cardiac symptoms as chest pain, palpitations, shortness of breath, dizziness or ankle swelling.   Patient's hyperlipidemia & is Intolerant to Statins, but  is controlled with diet and medications. Patient denies myalgias or other medication SE's. Last lipids were at goal - Cholesterol 123; HDL 33*; LDL Cholesterol 73; Triglycerides 86 on 10/16/2014.   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 Last A1c was 6.0% on 06/06/2014.      Finally, patient has history of Vitamin D Deficiency of "11" in 2008  and last vitamin D was 63 on 06/06/2014.      Medication Sig  . aspirin 81 MG tablet Take 81 mg by mouth daily.    Marland Kitchen VITAMIN D 2000 UNITS capsule Take 6,000 Units by mouth daily.  Marland Kitchen ezetimibe (ZETIA) 10 MG tablet TAKE 1 TABLET DAILY  . fluticasone (FLONASE)  nasal spray Place 1 spray into both nostrils daily.  Marland Kitchen LEVITRA 20 MG tablet TAKE 1 TABLET DAILY AS NEEDED FOR ERECTILE DYSFUNCTION  . metFORMIN -XR 500 MG 24 hr tablet TAKE 2 TABLETS TWICE A DAY WITH MEALS AS DIRECTED  . Multiple Vitamins-Minerals  Take 1 tablet by mouth daily.    Marland Kitchen FISH OIL  Take by mouth daily.  . valsartan  320 MG tablet Take 1 tablet (320 mg total) by mouth daily.  Bonne Dolores DRINK MIX Take by mouth. Drinks 1 packet daily   Allergies  Allergen  Reactions  . Cialis [Tadalafil] Other (See Comments)    Hot flashes  . Fructose Nausea And Vomiting  . Milk-Related Compounds   . Morphine And Related Itching  . Ppd [Tuberculin Purified Protein Derivative]     Positive 09-2004   Past Medical History  Diagnosis Date  . Allergy     fructose  . Cancer     prostate  . Hyperlipidemia   . Hypertension   . Chronic kidney disease     huge kidney stones  . Pre-diabetes   . Hypogonadism male   . Diabetes mellitus without complication 7846  . DM (diabetes mellitus) 07/09/2013   Health Maintenance  Topic Date Due  . OPHTHALMOLOGY EXAM  12/15/1958  . PNA vac Low Risk Adult (1 of 2 - PCV13) 12/14/2013  . FOOT EXAM  10/09/2014  . URINE MICROALBUMIN  10/09/2014  . INFLUENZA VACCINE  11/18/2014  . HEMOGLOBIN A1C  12/05/2014  . COLONOSCOPY  10/27/2020  . TETANUS/TDAP  06/28/2021  . ZOSTAVAX  Completed  . HIV Screening  Completed   Immunization History  Administered Date(s) Administered  . DTaP 02/17/2002  . Influenza Whole 01/08/2013  . Influenza, High Dose Seasonal PF 02/26/2014  . Pneumococcal Polysaccharide-23 02/18/1996  . Tdap 06/29/2011  . Zoster 03/23/2011   Past Surgical History  Procedure Laterality Date  . Finger amputation      left  Middle  . Kidney stone surgery      twice  . Robot assisted laparoscopic radical prostatectomy  2006  . Colonoscopy  2012  & needs 10 yr f/u   Family History  Problem Relation Age of Onset  . Heart disease Mother   . Diabetes Mother   . Arthritis Mother   . Alcohol abuse Father   . Hypertension Father    History   Social History  . Marital Status: Married    Spouse Name: N/A  . Number of Children: N/A  . Years of Education: N/A   Occupational History  . Retired [employer] and run several [place] .    Social History Main Topics  . Smoking status: Never Smoker   . Smokeless tobacco: Never Used  . Alcohol Use: Yes     Comment: rarely  . Drug Use: No  .  Sexual Activity: Not on file    ROS Constitutional: Denies fever, chills, weight loss/gain, headaches, insomnia,  night sweats or change in appetite. Does c/o fatigue. Eyes: Denies redness, blurred vision, diplopia, discharge, itchy or watery eyes.  ENT: Denies discharge, congestion, post nasal drip, epistaxis, sore throat, earache, hearing loss, dental pain, Tinnitus, Vertigo, Sinus pain or snoring.  Cardio: Denies chest pain, palpitations, irregular heartbeat, syncope, dyspnea, diaphoresis, orthopnea, PND, claudication or edema Respiratory: denies cough, dyspnea, DOE, pleurisy, hoarseness, laryngitis or wheezing.  Gastrointestinal: Denies dysphagia, heartburn, reflux, water brash, pain, cramps, nausea, vomiting, bloating, diarrhea, constipation, hematemesis, melena, hematochezia, jaundice or hemorrhoids Genitourinary: Denies dysuria, frequency, urgency, nocturia, hesitancy, discharge, hematuria or flank pain Musculoskeletal: Denies arthralgia, myalgia, stiffness, Jt. Swelling, pain, limp or strain/sprain. Denies Falls. Skin: Denies puritis, rash, hives, warts, acne, eczema or change in skin lesion Neuro: No weakness, tremor, incoordination, spasms, paresthesia or pain Psychiatric: Denies confusion, memory loss or sensory loss. Denies Depression. Endocrine: Denies change in weight, skin, hair change, nocturia, and paresthesia, diabetic polys, visual blurring or hyper / hypo glycemic episodes.  Heme/Lymph: No excessive bleeding, bruising or enlarged lymph nodes.  Physical Exam  BP 114/70   Pulse 56  Temp 97.3 F   Resp 16  Ht 5' 5.5"   Wt 185 lb 9.6 oz     BMI 30.40   General Appearance: Well nourished, in no apparent distress. Eyes: PERRLA, EOMs, conjunctiva no swelling or erythema, normal fundi and vessels. Sinuses: No frontal/maxillary tenderness ENT/Mouth: EACs patent / TMs  nl. Nares clear without erythema, swelling, mucoid exudates. Oral hygiene is good. No erythema, swelling, or  exudate. Tongue normal, non-obstructing. Tonsils not swollen or erythematous. Hearing normal.  Neck: Supple, thyroid normal. No bruits, nodes or JVD. Respiratory: Respiratory effort normal.  BS equal and clear bilateral without rales, rhonci, wheezing or stridor. Cardio: Heart sounds are normal with regular rate and rhythm and no murmurs, rubs or gallops. Peripheral pulses are normal and equal bilaterally without edema. No aortic or femoral bruits. Chest: symmetric with normal excursions and percussion.  Abdomen: Flat, soft, with bowel sounds. Nontender, no guarding, rebound, hernias, masses, or organomegaly.  Lymphatics: Non tender without lymphadenopathy.  Genitourinary:  DRE - deferred mto Dr Janice Norrie & Karsten Ro Musculoskeletal: Full ROM all peripheral extremities, joint stability, 5/5 strength, and normal gait. Skin: Warm and dry without rashes, lesions, cyanosis, clubbing or  ecchymosis.  Neuro: Cranial nerves intact, reflexes equal bilaterally. Normal muscle tone, no cerebellar symptoms. Sensation intact.  Pysch: Awake and oriented X 3 with normal affect, insight and judgment appropriate.   Assessment and Plan  1. Essential hypertension  - EKG 12-Lead - [place], RETROPERITNL ABD,  LTD - TSH  2. Mixed hyperlipidemia  - Lipid panel  3. Type 2 diabetes mellitus with other diabetic kidney complication  - Microalbumin / creatinine urine ratio - HM DIABETES FOOT EXAM - LOW EXTREMITY NEUR EXAM DOCUM - Hemoglobin A1c - Insulin, random  4. Vitamin D deficiency  - Vit D  25 hydroxy   5. Malignant neoplasm of prostate  - PSA  6. Nephrolithiasis   7. Screening for rectal cancer  - POC Hemoccult Bld/Stl   8. Prostate cancer screening   9. BMI 30.0-30.9,adult   10. At low risk for fall   11. Depression screen   12. Medication management  - Urine Microscopic - CBC with Differential/Platelet - BASIC METABOLIC PANEL WITH GFR - Hepatic function panel -  Magnesium   Continue prudent diet as discussed, weight control, BP monitoring, regular exercise, and medications as discussed.  Discussed med effects and SE's. Routine screening labs and tests as requested with regular follow-up as recommended.  Over 40 minutes of exam, counseling &  chart review was performed

## 2014-10-16 NOTE — Patient Instructions (Signed)

## 2014-10-17 LAB — URINALYSIS, MICROSCOPIC ONLY
BACTERIA UA: NONE SEEN
CASTS: NONE SEEN
CRYSTALS: NONE SEEN
SQUAMOUS EPITHELIAL / LPF: NONE SEEN

## 2014-10-17 LAB — VITAMIN D 25 HYDROXY (VIT D DEFICIENCY, FRACTURES): Vit D, 25-Hydroxy: 117 ng/mL — ABNORMAL HIGH (ref 30–100)

## 2014-10-17 LAB — PSA: PSA: <0.01 ng/mL (ref <=4.00)

## 2014-10-17 LAB — INSULIN, RANDOM: Insulin: 9.3 u[IU]/mL (ref 2.0–19.6)

## 2014-10-17 LAB — MICROALBUMIN / CREATININE URINE RATIO
Creatinine, Urine: 137.4 mg/dL
Microalb Creat Ratio: 16 mg/g (ref 0.0–30.0)
Microalb, Ur: 2.2 mg/dL — ABNORMAL HIGH (ref <2.0)

## 2014-10-22 ENCOUNTER — Encounter (HOSPITAL_BASED_OUTPATIENT_CLINIC_OR_DEPARTMENT_OTHER): Payer: Self-pay | Admitting: *Deleted

## 2014-10-23 ENCOUNTER — Encounter (HOSPITAL_BASED_OUTPATIENT_CLINIC_OR_DEPARTMENT_OTHER): Payer: Self-pay | Admitting: *Deleted

## 2014-10-23 ENCOUNTER — Other Ambulatory Visit: Payer: Medicare Other

## 2014-10-23 DIAGNOSIS — N390 Urinary tract infection, site not specified: Secondary | ICD-10-CM | POA: Diagnosis not present

## 2014-10-23 NOTE — Progress Notes (Addendum)
NPO AFTER MN.  ARRIVE AT 0600.  CURRENT LAB RESULTS AND EKG IN CHART AND EPIC.  WILL TAKE DIOVAN AM DOS W/ SIPS OF WATER .  WILL DO HIBICLENS SHOWER HS BEFORE AND AM DOS.  REVIEWED [facility] GUIDELINES , WILL BRING MEDS.

## 2014-10-24 LAB — URINALYSIS, ROUTINE W REFLEX MICROSCOPIC
Bilirubin Urine: NEGATIVE
Glucose, UA: NEGATIVE mg/dL
KETONES UR: NEGATIVE mg/dL
Nitrite: NEGATIVE
PROTEIN: NEGATIVE mg/dL
Specific Gravity, Urine: 1.014 (ref 1.005–1.030)
UROBILINOGEN UA: 0.2 mg/dL (ref 0.0–1.0)
pH: 5.5 (ref 5.0–8.0)

## 2014-10-24 LAB — URINALYSIS, MICROSCOPIC ONLY
Bacteria, UA: NONE SEEN
CASTS: NONE SEEN
CRYSTALS: NONE SEEN
Squamous Epithelial / LPF: NONE SEEN

## 2014-10-25 LAB — URINE CULTURE
Colony Count: NO GROWTH
Organism ID, Bacteria: NO GROWTH

## 2014-10-27 ENCOUNTER — Encounter (HOSPITAL_BASED_OUTPATIENT_CLINIC_OR_DEPARTMENT_OTHER): Payer: Self-pay | Admitting: Anesthesiology

## 2014-10-27 NOTE — Anesthesia Preprocedure Evaluation (Addendum)
Anesthesia Evaluation  Patient identified by MRN, date of birth, ID band Patient awake    Reviewed: Allergy & Precautions, H&P , NPO status , Patient's Chart, lab work & pertinent test results, reviewed documented beta blocker date and time , Unable to perform ROS - Chart review only  History of Anesthesia Complications Negative for: history of anesthetic complications  Airway Mallampati: I  TM Distance: >3 FB Neck ROM: full    Dental no notable dental hx.    Pulmonary neg pulmonary ROS,  breath sounds clear to auscultation  Pulmonary exam normal       Cardiovascular hypertension, On Medications Normal cardiovascular examRhythm:regular Rate:Normal     Neuro/Psych negative neurological ROS  negative psych ROS   GI/Hepatic negative GI ROS, Neg liver ROS,   Endo/Other  diabetes, Type 2, Oral Hypoglycemic Agents  Renal/GU Renal disease     Musculoskeletal   Abdominal (+) + obese,   Peds  Hematology negative hematology ROS (+)   Anesthesia Other Findings NPO appropriate, no meds today, allergies reviewed Denies active cardiac or pulmonary symptoms, METS > 4 No recent congestive cough or symptoms of upper respiratory infection -took valsartan this am, metformin yesterday, no inhaler use  Reproductive/Obstetrics negative OB ROS                          Anesthesia Physical Anesthesia Plan  ASA: II  Anesthesia Plan: General   Post-op Pain Management:    Induction: Intravenous  Airway Management Planned: LMA  Additional Equipment:   Intra-op Plan:   Post-operative Plan: Extubation in OR  Informed Consent: I have reviewed the patients History and Physical, chart, labs and discussed the procedure including the risks, benefits and alternatives for the proposed anesthesia with the patient or authorized representative who has indicated his/her understanding and acceptance.   Dental Advisory  Given  Plan Discussed with: Anesthesiologist and CRNA  Anesthesia Plan Comments:         Anesthesia Quick Evaluation

## 2014-10-28 ENCOUNTER — Ambulatory Visit (HOSPITAL_BASED_OUTPATIENT_CLINIC_OR_DEPARTMENT_OTHER): Payer: Medicare Other | Admitting: Anesthesiology

## 2014-10-28 ENCOUNTER — Observation Stay (HOSPITAL_BASED_OUTPATIENT_CLINIC_OR_DEPARTMENT_OTHER)
Admission: RE | Admit: 2014-10-28 | Discharge: 2014-10-29 | Disposition: A | Discharge status: Home / Self Care | Payer: Medicare Other | Source: Ambulatory Visit | Attending: Urology | Admitting: Urology

## 2014-10-28 ENCOUNTER — Encounter (HOSPITAL_COMMUNITY)
Admission: RE | Disposition: A | Discharge status: Home / Self Care | Payer: Self-pay | Source: Ambulatory Visit | Attending: Urology

## 2014-10-28 ENCOUNTER — Encounter (HOSPITAL_BASED_OUTPATIENT_CLINIC_OR_DEPARTMENT_OTHER): Payer: Self-pay | Admitting: *Deleted

## 2014-10-28 DIAGNOSIS — N393 Stress incontinence (female) (male): Secondary | ICD-10-CM | POA: Insufficient documentation

## 2014-10-28 DIAGNOSIS — E669 Obesity, unspecified: Secondary | ICD-10-CM | POA: Insufficient documentation

## 2014-10-28 DIAGNOSIS — I10 Essential (primary) hypertension: Secondary | ICD-10-CM | POA: Insufficient documentation

## 2014-10-28 DIAGNOSIS — N5234 Erectile dysfunction following simple prostatectomy: Principal | ICD-10-CM | POA: Insufficient documentation

## 2014-10-28 DIAGNOSIS — Z8546 Personal history of malignant neoplasm of prostate: Secondary | ICD-10-CM | POA: Diagnosis not present

## 2014-10-28 DIAGNOSIS — N5231 Erectile dysfunction following radical prostatectomy: Secondary | ICD-10-CM | POA: Diagnosis not present

## 2014-10-28 DIAGNOSIS — N528 Other male erectile dysfunction: Secondary | ICD-10-CM | POA: Diagnosis not present

## 2014-10-28 DIAGNOSIS — Z6830 Body mass index (BMI) 30.0-30.9, adult: Secondary | ICD-10-CM | POA: Insufficient documentation

## 2014-10-28 DIAGNOSIS — Z7982 Long term (current) use of aspirin: Secondary | ICD-10-CM | POA: Insufficient documentation

## 2014-10-28 DIAGNOSIS — E291 Testicular hypofunction: Secondary | ICD-10-CM | POA: Diagnosis not present

## 2014-10-28 DIAGNOSIS — Z79899 Other long term (current) drug therapy: Secondary | ICD-10-CM | POA: Insufficient documentation

## 2014-10-28 DIAGNOSIS — E118 Type 2 diabetes mellitus with unspecified complications: Secondary | ICD-10-CM | POA: Insufficient documentation

## 2014-10-28 DIAGNOSIS — N529 Male erectile dysfunction, unspecified: Secondary | ICD-10-CM | POA: Diagnosis present

## 2014-10-28 HISTORY — DX: Personal history of other medical treatment: Z92.89

## 2014-10-28 HISTORY — DX: Nocturia: R35.1

## 2014-10-28 HISTORY — DX: Type 2 diabetes mellitus without complications: E11.9

## 2014-10-28 HISTORY — DX: Personal history of colon polyps, unspecified: Z86.0100

## 2014-10-28 HISTORY — DX: Male erectile dysfunction, unspecified: N52.9

## 2014-10-28 HISTORY — DX: Mixed hyperlipidemia: E78.2

## 2014-10-28 HISTORY — DX: Personal history of colonic polyps: Z86.010

## 2014-10-28 HISTORY — DX: Personal history of urinary calculi: Z87.442

## 2014-10-28 HISTORY — PX: PENILE PROSTHESIS IMPLANT: SHX240

## 2014-10-28 HISTORY — DX: Diverticulosis of large intestine without perforation or abscess without bleeding: K57.30

## 2014-10-28 HISTORY — DX: Personal history of malignant neoplasm of prostate: Z85.46

## 2014-10-28 LAB — GLUCOSE, CAPILLARY: Glucose-Capillary: 119 mg/dL — ABNORMAL HIGH (ref 65–99)

## 2014-10-28 LAB — POCT I-STAT 4, (NA,K, GLUC, HGB,HCT)
Glucose, Bld: 104 mg/dL — ABNORMAL HIGH (ref 65–99)
HCT: 47 % (ref 39.0–52.0)
Hemoglobin: 16 g/dL (ref 13.0–17.0)
Potassium: 3.9 mmol/L (ref 3.5–5.1)
Sodium: 140 mmol/L (ref 135–145)

## 2014-10-28 SURGERY — INSERTION, PENILE PROSTHESIS, INFLATABLE
Anesthesia: General | Site: Penis

## 2014-10-28 MED ORDER — ZOLPIDEM TARTRATE 5 MG PO TABS: 5.0000 mg | ORAL_TABLET | Freq: Every evening | ORAL | Status: DC | PRN

## 2014-10-28 MED ORDER — LACTATED RINGERS IV SOLN
INTRAVENOUS | Status: DC
  Administered 2014-10-28 (×2): via INTRAVENOUS
  Filled 2014-10-28: qty 1000

## 2014-10-28 MED ORDER — ACETAMINOPHEN 10 MG/ML IV SOLN
INTRAVENOUS | Status: DC | PRN
  Administered 2014-10-28: 1000 mg via INTRAVENOUS

## 2014-10-28 MED ORDER — FENTANYL CITRATE (PF) 100 MCG/2ML IJ SOLN
INTRAMUSCULAR | Status: AC
  Filled 2014-10-28: qty 4

## 2014-10-28 MED ORDER — OXYCODONE HCL 5 MG PO TABS
5.0000 mg | ORAL_TABLET | ORAL | Status: DC | PRN
  Administered 2014-10-28 – 2014-10-29 (×2): 5 mg via ORAL
  Filled 2014-10-28 (×2): qty 1

## 2014-10-28 MED ORDER — POLYMYXIN B SULFATE 500000 UNITS IJ SOLR
INTRAMUSCULAR | Status: DC | PRN
  Administered 2014-10-28: 500 mL

## 2014-10-28 MED ORDER — CEFAZOLIN SODIUM-DEXTROSE 2-3 GM-% IV SOLR
INTRAVENOUS | Status: AC
  Filled 2014-10-28: qty 50

## 2014-10-28 MED ORDER — SODIUM CHLORIDE 0.9 % IV SOLN
INTRAVENOUS | Status: DC
  Administered 2014-10-28 (×2): via INTRAVENOUS

## 2014-10-28 MED ORDER — ONDANSETRON HCL 4 MG/2ML IJ SOLN: 4.0000 mg | INTRAMUSCULAR | Status: DC | PRN

## 2014-10-28 MED ORDER — GENTAMICIN SULFATE 40 MG/ML IJ SOLN
5.0000 mg/kg | INTRAVENOUS | Status: AC
  Administered 2014-10-28: 430 mg via INTRAVENOUS
  Filled 2014-10-28 (×2): qty 10.75

## 2014-10-28 MED ORDER — BACITRACIN-NEOMYCIN-POLYMYXIN OINTMENT TUBE
TOPICAL_OINTMENT | CUTANEOUS | Status: DC | PRN
  Administered 2014-10-28: 1 via TOPICAL

## 2014-10-28 MED ORDER — CEFAZOLIN SODIUM 1-5 GM-% IV SOLN
1.0000 g | Freq: Three times a day (TID) | INTRAVENOUS | Status: DC
Start: 2014-10-28 — End: 2014-10-29
  Administered 2014-10-28 (×2): 1 g via INTRAVENOUS
  Filled 2014-10-28 (×3): qty 50

## 2014-10-28 MED ORDER — FENTANYL CITRATE (PF) 100 MCG/2ML IJ SOLN
INTRAMUSCULAR | Status: AC
  Filled 2014-10-28: qty 2

## 2014-10-28 MED ORDER — SODIUM CHLORIDE 0.9 % IR SOLN
Status: DC | PRN
  Administered 2014-10-28: 500 mL

## 2014-10-28 MED ORDER — MIDAZOLAM HCL 5 MG/5ML IJ SOLN
INTRAMUSCULAR | Status: DC | PRN
  Administered 2014-10-28 (×2): 1 mg via INTRAVENOUS

## 2014-10-28 MED ORDER — BUPIVACAINE HCL 0.5 % IJ SOLN
INTRAMUSCULAR | Status: DC | PRN
  Administered 2014-10-28: 7 mL

## 2014-10-28 MED ORDER — DEXAMETHASONE SODIUM PHOSPHATE 4 MG/ML IJ SOLN
INTRAMUSCULAR | Status: DC | PRN
  Administered 2014-10-28: 10 mg via INTRAVENOUS

## 2014-10-28 MED ORDER — ONDANSETRON HCL 4 MG/2ML IJ SOLN
INTRAMUSCULAR | Status: DC | PRN
  Administered 2014-10-28: 4 mg via INTRAVENOUS

## 2014-10-28 MED ORDER — CHLORHEXIDINE GLUCONATE 4 % EX LIQD
Freq: Once | CUTANEOUS | Status: DC
  Filled 2014-10-28: qty 15

## 2014-10-28 MED ORDER — MIDAZOLAM HCL 2 MG/2ML IJ SOLN
INTRAMUSCULAR | Status: AC
  Filled 2014-10-28: qty 2

## 2014-10-28 MED ORDER — LIDOCAINE HCL (CARDIAC) 20 MG/ML IV SOLN
INTRAVENOUS | Status: DC | PRN
  Administered 2014-10-28: 60 mg via INTRAVENOUS

## 2014-10-28 MED ORDER — HYOSCYAMINE SULFATE 0.125 MG SL SUBL
0.1250 mg | SUBLINGUAL_TABLET | SUBLINGUAL | Status: DC | PRN
  Filled 2014-10-28: qty 1

## 2014-10-28 MED ORDER — FENTANYL CITRATE (PF) 100 MCG/2ML IJ SOLN
INTRAMUSCULAR | Status: DC | PRN
  Administered 2014-10-28 (×2): 25 ug via INTRAVENOUS
  Administered 2014-10-28: 50 ug via INTRAVENOUS
  Administered 2014-10-28 (×2): 25 ug via INTRAVENOUS
  Administered 2014-10-28: 50 ug via INTRAVENOUS

## 2014-10-28 MED ORDER — HYDROMORPHONE HCL 1 MG/ML IJ SOLN
0.5000 mg | INTRAMUSCULAR | Status: DC | PRN
  Administered 2014-10-28: 1 mg via INTRAVENOUS
  Administered 2014-10-28: 0.5 mg via INTRAVENOUS
  Filled 2014-10-28 (×2): qty 1

## 2014-10-28 MED ORDER — CEFAZOLIN SODIUM-DEXTROSE 2-3 GM-% IV SOLR
2.0000 g | INTRAVENOUS | Status: AC
  Administered 2014-10-28: 2 g via INTRAVENOUS
  Filled 2014-10-28: qty 50

## 2014-10-28 MED ORDER — PROMETHAZINE HCL 25 MG/ML IJ SOLN
6.2500 mg | INTRAMUSCULAR | Status: DC | PRN
  Filled 2014-10-28: qty 1

## 2014-10-28 MED ORDER — PROPOFOL 10 MG/ML IV BOLUS
INTRAVENOUS | Status: DC | PRN
  Administered 2014-10-28: 150 mg via INTRAVENOUS
  Administered 2014-10-28 (×2): 20 mg via INTRAVENOUS

## 2014-10-28 MED ORDER — ACETAMINOPHEN 325 MG PO TABS: 650.0000 mg | ORAL_TABLET | ORAL | Status: DC | PRN

## 2014-10-28 MED ORDER — EPHEDRINE SULFATE 50 MG/ML IJ SOLN
INTRAMUSCULAR | Status: DC | PRN
  Administered 2014-10-28: 15 mg via INTRAVENOUS
  Administered 2014-10-28: 10 mg via INTRAVENOUS

## 2014-10-28 MED ORDER — FENTANYL CITRATE (PF) 100 MCG/2ML IJ SOLN
25.0000 ug | INTRAMUSCULAR | Status: DC | PRN
  Administered 2014-10-28 (×2): 50 ug via INTRAVENOUS
  Filled 2014-10-28: qty 1

## 2014-10-28 SURGICAL SUPPLY — 79 items
APL SKNCLS STERI-STRIP NONHPOA (GAUZE/BANDAGES/DRESSINGS) ×1
APPLICATOR COTTON TIP 6IN STRL (MISCELLANEOUS) IMPLANT
BAG DECANTER FOR FLEXI CONT (MISCELLANEOUS) IMPLANT
BAG URINE DRAINAGE (UROLOGICAL SUPPLIES) ×2 IMPLANT
BANDAGE CO FLEX L/F 2IN X 5YD (GAUZE/BANDAGES/DRESSINGS) IMPLANT
BENZOIN TINCTURE PRP APPL 2/3 (GAUZE/BANDAGES/DRESSINGS) ×2 IMPLANT
BLADE 15 SAFETY STRL DISP (BLADE) ×2 IMPLANT
BLADE CLIPPER SURG (BLADE) ×2 IMPLANT
BLADE HEX COATED 2.75 (ELECTRODE) ×2 IMPLANT
BLADE SURG 10 STRL SS (BLADE) IMPLANT
BLADE SURG 15 STRL LF DISP TIS (BLADE) ×1 IMPLANT
BLADE SURG 15 STRL SS (BLADE) ×2
BNDG GAUZE ELAST 4 BULKY (GAUZE/BANDAGES/DRESSINGS) ×2 IMPLANT
CANISTER SUCTION 1200CC (MISCELLANEOUS) IMPLANT
CANISTER SUCTION 2500CC (MISCELLANEOUS) ×2 IMPLANT
CATH FOLEY 2WAY SLVR  5CC 16FR (CATHETERS) ×1
CATH FOLEY 2WAY SLVR 5CC 16FR (CATHETERS) ×1 IMPLANT
CHLORAPREP W/TINT 26ML (MISCELLANEOUS) ×2 IMPLANT
CLOTH BEACON ORANGE TIMEOUT ST (SAFETY) ×2 IMPLANT
COVER BACK TABLE 60X90IN (DRAPES) ×2 IMPLANT
COVER MAYO STAND STRL (DRAPES) ×4 IMPLANT
DISSECTOR ROUND CHERRY 3/8 STR (MISCELLANEOUS) IMPLANT
DRAPE EXTREMITY T 121X128X90 (DRAPE) ×2 IMPLANT
DRAPE LAPAROTOMY TRNSV 102X78 (DRAPE) IMPLANT
DRESSING TELFA ISLAND 4X8 (GAUZE/BANDAGES/DRESSINGS) ×2 IMPLANT
DRSG TEGADERM 4X4.75 (GAUZE/BANDAGES/DRESSINGS) ×2 IMPLANT
ELECT REM PT RETURN 9FT ADLT (ELECTROSURGICAL) ×2
ELECTRODE REM PT RTRN 9FT ADLT (ELECTROSURGICAL) ×1 IMPLANT
GLOVE BIO SURGEON STRL SZ8 (GLOVE) ×2 IMPLANT
GLOVE BIOGEL M 6.5 STRL (GLOVE) ×6 IMPLANT
GLOVE BIOGEL PI IND STRL 6.5 (GLOVE) ×1 IMPLANT
GLOVE BIOGEL PI IND STRL 7.5 (GLOVE) ×1 IMPLANT
GLOVE BIOGEL PI INDICATOR 6.5 (GLOVE) ×1
GLOVE BIOGEL PI INDICATOR 7.5 (GLOVE) ×1
GOWN STRL REUS W/ TWL LRG LVL3 (GOWN DISPOSABLE) ×1 IMPLANT
GOWN STRL REUS W/ TWL XL LVL3 (GOWN DISPOSABLE) ×2 IMPLANT
GOWN STRL REUS W/TWL LRG LVL3 (GOWN DISPOSABLE) ×1
GOWN STRL REUS W/TWL XL LVL3 (GOWN DISPOSABLE) ×2
HOLDER FOLEY CATH W/STRAP (MISCELLANEOUS) ×2 IMPLANT
IV NS 250ML (IV SOLUTION)
IV NS 250ML BAXH (IV SOLUTION) IMPLANT
KIT TITAN ASSEMBLY (Erectile Restoration) ×1 IMPLANT
KIT TITAN ASSEMBLY STANDARD (Erectile Restoration) ×1 IMPLANT
KIT TITAN ASSEMBLY STD (Erectile Restoration) ×1 IMPLANT
LIQUID BAND (GAUZE/BANDAGES/DRESSINGS) ×2 IMPLANT
NEEDLE HYPO 25X1 1.5 SAFETY (NEEDLE) ×2 IMPLANT
NS IRRIG 500ML POUR BTL (IV SOLUTION) ×2 IMPLANT
PACK BASIN DAY SURGERY FS (CUSTOM PROCEDURE TRAY) ×2 IMPLANT
PENCIL BUTTON HOLSTER BLD 10FT (ELECTRODE) ×2 IMPLANT
PLUG CATH AND CAP STER (CATHETERS) ×2 IMPLANT
PROS TITAN SCROT 0 ANG 18CM (Erectile Restoration) ×2 IMPLANT
PROSTHESIS TTN SCRO 0 ANG 18CM (Erectile Restoration) ×1 IMPLANT
RESERVOIR TITAN 12.5CC W/VLV ×2 IMPLANT
RETRACTOR WILSON SYSTEM (INSTRUMENTS) ×2 IMPLANT
SPONGE GAUZE 4X4 12PLY STER LF (GAUZE/BANDAGES/DRESSINGS) IMPLANT
SPONGE LAP 18X18 X RAY DECT (DISPOSABLE) IMPLANT
SPONGE LAP 4X18 X RAY DECT (DISPOSABLE) ×6 IMPLANT
STRIP CLOSURE SKIN 1/2X4 (GAUZE/BANDAGES/DRESSINGS) IMPLANT
SUPPORT SCROTAL LG STRP (MISCELLANEOUS) IMPLANT
SURGILUBE 2OZ TUBE FLIPTOP (MISCELLANEOUS) ×2 IMPLANT
SUT CHROMIC 3 0 SH 27 (SUTURE) ×8 IMPLANT
SUT MNCRL AB 4-0 PS2 18 (SUTURE) IMPLANT
SUT PDS AB 2-0 CT2 27 (SUTURE) ×10 IMPLANT
SUT VIC AB 2-0 UR6 27 (SUTURE) ×2 IMPLANT
SUT VIC AB 3-0 SH 27 (SUTURE)
SUT VIC AB 3-0 SH 27X BRD (SUTURE) IMPLANT
SUT VICRYL 0 UR6 27IN ABS (SUTURE) IMPLANT
SUT VICRYL 4-0 PS2 18IN ABS (SUTURE) IMPLANT
SYR 20CC LL (SYRINGE) ×2 IMPLANT
SYR 50ML LL SCALE MARK (SYRINGE) ×4 IMPLANT
SYR BULB IRRIGATION 50ML (SYRINGE) ×2 IMPLANT
SYR CONTROL 10ML LL (SYRINGE) ×2 IMPLANT
SYRINGE 10CC LL (SYRINGE) ×2 IMPLANT
TOWEL OR 17X24 6PK STRL BLUE (TOWEL DISPOSABLE) ×6 IMPLANT
TRAY DSU PREP LF (CUSTOM PROCEDURE TRAY) ×2 IMPLANT
TUBE CONNECTING 12X1/4 (SUCTIONS) ×2 IMPLANT
WATER STERILE IRR 3000ML UROMA (IV SOLUTION) IMPLANT
WATER STERILE IRR 500ML POUR (IV SOLUTION) ×2 IMPLANT
YANKAUER SUCT BULB TIP NO VENT (SUCTIONS) ×2 IMPLANT

## 2014-10-28 NOTE — Progress Notes (Signed)
ANTIBIOTIC CONSULT NOTE   Pharmacy Consult for Gentamicin Indication: surgical prophylaxis  Allergies  Allergen Reactions  . Cialis [Tadalafil] Other (See Comments)    Hot flashes  . Fructose Nausea And Vomiting  . Morphine And Related Itching    Patient Measurements: Height: 5\' 5"  (165.1 cm) Weight: 184 lb (83.462 kg) IBW/kg (Calculated) : 61.5 Adjusted Body Weight:   Vital Signs: Temp: 97.8 F (36.6 C) (07/11 1200) Temp Source: Oral (07/11 0603) BP: 122/82 mmHg (07/11 1200) Pulse Rate: 71 (07/11 1200) Intake/Output from previous day:   Intake/Output from this shift: Total I/O In: 1320 [P.O.:220; I.V.:1100] Out: 250 [Urine:150; Blood:100]  Labs:  Recent Labs  10/28/14 0646  HGB 16.0   Estimated Creatinine Clearance: 60 mL/min (by C-G formula based on Cr of 1.22). No results for input(s): VANCOTROUGH, VANCOPEAK, VANCORANDOM, GENTTROUGH, GENTPEAK, GENTRANDOM, TOBRATROUGH, TOBRAPEAK, TOBRARND, AMIKACINPEAK, AMIKACINTROU, AMIKACIN in the last 72 hours.   Microbiology: Recent Results (from the past 720 hour(s))  Culture, Urine     Status: None   Collection Time: 10/23/14 10:05 AM  Result Value Ref Range Status   Colony Count NO GROWTH  Final   Organism ID, Bacteria NO GROWTH  Final    Medical History: Past Medical History  Diagnosis Date  . Hyperlipidemia   . Hypertension   . Hypogonadism male   . Mixed hyperlipidemia   . Type 2 diabetes mellitus   . Sigmoid diverticulosis     MILD  . History of colon polyps   . History of kidney stones   . ED (erectile dysfunction) of organic origin   . History of prostate cancer     DEC 2008--  S/P  RADICAL PROSTATECTOMY  . History of positive PPD     06/ 2006--  CXR NORMAL  . Nocturia     Assessment: Pharmacy consulted to dose gentamicin for surgical prophylaxis in 77 yoM undergoing implantation of penile prosthesis.  Patient appropriately received pre-op gentamicin dose of 5 mg/kg x 1 before procedure  today.  Goal of Therapy:  Surgical prophylaxis  Plan:  No further gentamicin dose necessary for prophylaxis.  Hershal Coria 10/28/2014,12:40 PM

## 2014-10-28 NOTE — Discharge Instructions (Signed)
Penile prosthesis postoperative instructions ° °Wound: ° °In most cases your incision will have absorbable sutures that will dissolve within the first 10-20 days. Some will fall out even earlier. Expect some redness as the sutures dissolved but this should occur only around the sutures. If there is generalized redness, especially with increasing pain or swelling, let us know. The scrotum and penis will very likely get "black and blue" as the blood in the tissues spread. Sometimes the whole scrotum will turn colors. The black and blue is followed by a yellow and brown color. In time, all the discoloration will go away. In some cases some firm swelling in the area of the testicle and pump may persist for up to 4-6 weeks after the surgery and is considered normal in most cases. ° °Diet: ° °You may return to your normal diet within 24 hours following your surgery. You may note some mild nausea and possibly vomiting the first 6-8 hours following surgery. This is usually due to the side effects of anesthesia, and will disappear quite soon. I would suggest clear liquids and a very light meal the first evening following your surgery. ° °Activity: ° °Your physical activity should be restricted the first 48 hours. During that time you should remain relatively inactive, moving about only when necessary. During the first 7-10 days following surgery he should avoid lifting any heavy objects (anything greater than 15 pounds), and avoid strenuous exercise. If you work, ask us specifically about your restrictions, both for work and home. We will write a note to your employer if needed. ° °You should plan to wear a tight pair of jockey shorts or an athletic supporter for the first 4-5 days, even to sleep. This will keep the scrotum immobilized to some degree and keep the swelling down.The position of your penis will determine what is most comfortable but I strongly urge you to keep the penis in the "up" position (toward your  head). ° °Ice packs should be placed on and off over the scrotum for the first 48 hours. Frozen peas or corn in a ZipLock bag can be frozen, used and re-frozen. Fifteen minutes on and 15 minutes off is a reasonable schedule. The ice is a good pain reliever and keeps the swelling down. ° °Hygiene: ° °You may shower 48 hours after your surgery. Tub bathing should be restricted until the seventh day. ° °Medication: ° °You will be sent home with some type of pain medication. In many cases you will be sent home with a narcotic pain pill (hydrocodone or oxycodone). If the pain is not too bad, you may take either Tylenol (acetaminophen) or Advil (ibuprofen) which contain no narcotic agents, and might be tolerated a little better, with fewer side effects. If the pain medication you are sent home with does not control the pain, you will have to let us know. Some narcotic pain medications cannot be given or refilled by a phone call to a pharmacy. ° °Problems you should report to us: ° °· Fever of 101.0 degrees Fahrenheit or greater. °· Moderate or severe swelling under the skin incision or involving the scrotum. °Drug reaction such as hives, a rash, nausea or vomiting. °

## 2014-10-28 NOTE — Anesthesia Postprocedure Evaluation (Signed)
  Anesthesia Post-op Note  Patient: Cody Zamora  Procedure(s) Performed: Procedure(s) (LRB): IMPLANTATION 3 PIECE PENILE INFLATABLE PROTHESIS/COLOPLAST SCROTAL APPROACH (N/A)  Patient Location: PACU  Anesthesia Type: General  Level of Consciousness: awake and alert   Airway and Oxygen Therapy: Patient Spontanous Breathing  Post-op Pain: mild  Post-op Assessment: Post-op Vital signs reviewed, Patient's Cardiovascular Status Stable, Respiratory Function Stable, Patent Airway and No signs of Nausea or vomiting  Last Vitals:  Filed Vitals:   10/28/14 1200  BP: 122/82  Pulse: 71  Temp: 36.6 C  Resp: 16    Post-op Vital Signs: stable   Complications: No apparent anesthesia complications

## 2014-10-28 NOTE — Anesthesia Procedure Notes (Signed)
Procedure Name: LMA Insertion Date/Time: 10/28/2014 7:48 AM Performed by: Mechele Claude Pre-anesthesia Checklist: Patient identified, Emergency Drugs available, Suction available and Patient being monitored Patient Re-evaluated:Patient Re-evaluated prior to inductionOxygen Delivery Method: Circle System Utilized Preoxygenation: Pre-oxygenation with 100% oxygen Intubation Type: IV induction Ventilation: Mask ventilation without difficulty LMA: LMA inserted LMA Size: 4.0 Number of attempts: 1 Airway Equipment and Method: bite block Placement Confirmation: positive ETCO2 Tube secured with: Tape Dental Injury: Teeth and Oropharynx as per pre-operative assessment

## 2014-10-28 NOTE — Op Note (Signed)
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 Implantation of 3 piece Inflatable prosthesis Artist) 2. Simple foley catheter placement  Surgeon: Surgeon(s) and Role:    * Kathie Rhodes, MD - Primary   Resident:  Star Age, MD  EBL: 10 mL  IVF: See anesthesia record  Drains: none  Implants:  Implant Name Type Inv. Item Serial No. Manufacturer Lot No. LRB No. Used  KIT TITAN ASSEMBLY - [id number] Erectile Restoration KIT TITAN ASSEMBLY  MENTOR [id number] N/A 1  TITAN RESERCOIR ECLD 12.5CC - [id number]  [place] ECLD 12.5CC  COLOPLAST [id number] N/A 1  PROS TITAN SCROT 0 ANG 18CM - [id number] Erectile Restoration PROS TITAN SCROT 0 ANG 18CM   COLOPLAST [id number] N/A 1    Specimens: none  Complications: * No complications entered in OR log *  Indications for Surgery: 64 y.o. male with post-prostatectomy erectile dysfunction refractory to all other conservative treatment measures. Risks, benefits, and alternatives of the above procedure were discussed previously in detail and informed consents was signed and verified.  Findings:  Excellent cosmetic outcome on the penile implant. A Coloplast Titan Touch device was used. A 125 mL cloverleaf reservoir was placed on the patient's RIGHT side, posterior to transversalis fascia and directed cephalad, and filled with 75 mL of sterile injectable saline. The corpora measured 10 cm proximal and 9 cm distal bilaterally. An 18 cm cylinder implant with 1 cm rear tip extenders was placed.  Procedure Details:   After informed consent was obtained, the patient was correctly identified in the preoperative holding area and transported back to the operating room. A timeout was called and general anesthesia was induced. Preoperative antibiotics were administered. A 5  minute Chlorhexidine pre-scrub was performed after carefully clipping the groin and scrotum.   A timeout was called again. A 16 French Foley catheter was placed into the bladder without difficulty. A [place] retractor was assembled and the penis was retracted cephalad. A 4 cm penoscrotal incision was made and carried down through dartos fascia with electrocautery. A Henry sweep maneuver was made over the tunica albuginea of the corpora cavernosa bilaterally. Midline urethral attachments were taken down sharply. Care was taken throughout this time for careful hemostasis. The hooks for the [facility] retractor were placed. 2-0 PDS sutures were preplaced at the areas of the corporotomies, which were then made with a #12 blade. The proximal and distal corpora were dilated with Hegar dilators up to 13 Fr. A measuring device was then used to measure the corpora which were noted to be10 cm proximally and 9 cm distally.   There was no evidence of perforation or crossover. The location for the reservoir was chosen to be on the patient's RIGHT side. The ring forcep was passed medial to the spermatic cord and just inside the external ring. The posterior wall of the inguinal canal comprised of the transversalis fascia was entered, which was then turned cephalad and spread up anterior to the transversalis fascia creating a place for the reservoir. A 125 mL cloverleaf reservoir was prepared on the back table. All implant devices were dipped in bacitracin and polymyxin prior to ever being touched. All air was removed out of the reservoir before it was placed in our reservoir location. The reservoir was filled with 75 mL of sterile injectable saline and then clamped. An 18 cm Titan Touch zero  degree implant was prepared on the back table and 1 cm rear tip extenders were placed. The cylinders and pump were then brought into the field. The Furlow and Lanny Hurst needle were used to pass each cylinder out distally in standard fashion.  The proximal cylinders were seated nicely. Before connecting the reservoir to the pump and before closing the corporotomies, a syringe of sterile injectable saline was attached to the pump and the device was tested. The cylinders were equal and symmetric bilaterally without evidence of crossover. This fluid was then removed from the cylinders and the corporotomies were closed with the preplaced 2-0 PDS. The space for the pump was made in the posterior midline scrotum as a subdartos pouch. The pump and reservoir were then connected using the accessory kit after excess tubing was tailored to fit. The device was tested. The cylinders were well seated and with an excellent result. Copious irrigation with antibiotic solution was performed. Excellent hemostasis was achieved.The dartos was closed in two layers of 3-0 chromic. The skin was closed in a running horizontal mattress with 4-0 Monocryl.  The patient was awakened from anesthesia having tolerated the procedure well and transported to the PACU in stable condition.   Teaching Physician Attestation: Dr. Karsten Ro was present and scrubbed for the entirety of the procedure  Pietro Cassis. Ottis Stain, MD Resident [facility] Department of Urologic Surgery/[facility]

## 2014-10-28 NOTE — H&P (Signed)
Cody Zamora is a 64 year old male patient of Dr. Janice Norrie with erectile dysfunction.   History of Present Illness Adenocarcinoma of the prostate: He had a PSA elevation from his baseline of 2.8 and underwent TRUS/BX in 9/08 which revealed Gleason 3+4 = 7 adenocarcinoma.  Treatment: Radical prostatectomy by Dr. Nevada Crane in 12/08. His PSA fell undetectable and is remained there.    History of calculus disease: He underwent a left percutaneous nephrolithotomy in 12/10. Second look was undertaken to clear him of all stone fragments.  KUB in 4/16 revealed no obvious radio opaque calculi.  Stone analysis calcium phosphate 80% and calcium oxalate 20%. He is noted to have a normal serum calcium.    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    Hypogonadism: He was found to have a low serum testosterone and placed on testosterone replacement therapy by his primary care physician using Testim.    Erectile dysfunction: This developed after his radical prostatectomy. He has tried oral agents without improvement and did develop side effects. He has also tried a vacuum erection device as well as intracavernosal injections without adequate response.     Interval history: He is ambidextrous and has done extensive research on penile prosthesis and would like to discuss this as an option for treatment of his erectile dysfunction. He gets no erections whatsoever.   Past Medical History Problems  1. History of Bilateral kidney stones (N20.0) 2. History of depression (Z86.59) 3. History of hypercholesterolemia (Z86.39) 4. History of malignant neoplasm of prostate (Z85.46)  Surgical History Problems  1. History of Cystoscopy With Manipulation Of Ureteral Calculus 2. History of Percutaneous Lithotomy For Stone Over 2cm. 3. History of Primary Repair Of Ruptured Achilles Tendon 4. History of Prostatect Perineal Radical W/ Bilat Pelvic Lymphadenectomy 5.  History of Renal Endoscopy Through Nephrostomy With Calculus Removal 6. History of Shoulder Surgery  Current Meds 1. Allegra TABS;  Therapy: (Recorded:19Aug2008) to Recorded 2. Aspirin 81 MG Oral Tablet;  Therapy: (Recorded:19Aug2008) to Recorded 3. Benefiber TABS;  Therapy: (Recorded:30Jan2015) to Recorded 4. Diovan CAPS;  Therapy: (Recorded:19Aug2008) to Recorded 5. Fish Oil CAPS;  Therapy: (Recorded:30Jan2015) to Recorded 6. Flonase SUSP;  Therapy: (Recorded:19Aug2008) to Recorded 7. MetFORMIN HCl - 500 MG Oral Tablet;  Therapy: (Recorded:30Jan2015) to Recorded 8. Vitamin D TABS;  Therapy: (Recorded:30Jan2015) to Recorded 9. Zetia 10 MG Oral Tablet;  Therapy: (Recorded:30Jan2015) to Recorded  Allergies Medication  1. Fructose SOLN 2. Morphine Derivatives  Family History Problems  1. Family history of Arthritis : Mother 2. Family history of Death In The Family Father   cirrohsis (?) of liver 3. Family history of Death In The Family Mother   lung inflammation 4. Family history of Family Health Status Number Of Children   1 son 5. Family history of Nephrolithiasis : Mother  Social History Problems  1. Denied: Alcohol Use 2. Caffeine Use   2 per wk 3. Marital History - Currently Married 4. Never smoker 5. 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 Denied: Tobacco Use  Review of Systems Genitourinary, constitutional, skin, eye, otolaryngeal, hematologic/lymphatic, cardiovascular, pulmonary, endocrine, musculoskeletal, gastrointestinal, neurological and psychiatric system(s) were reviewed and pertinent findings if present are noted.  Genitourinary: erectile dysfunction.    Vitals Vital Signs   Height: 5 ft 5 in Weight: 190 lb  BMI Calculated: 31.62 BSA Calculated: 1.94 Blood Pressure: 111 / 68 Heart Rate: 57  Physical Exam Constitutional: Well nourished and well developed . No acute distress.  ENT:. The  ears and nose are normal in appearance.  Neck: The  appearance of the neck is normal and no neck mass is present.  Pulmonary: No respiratory distress and normal respiratory rhythm and effort.  Cardiovascular: Heart rate and rhythm are normal . No peripheral edema.  Abdomen: The abdomen is soft and nontender. No masses are palpated. No CVA tenderness. No hernias are palpable. No hepatosplenomegaly noted.  Genitourinary: Examination of the penis demonstrates no discharge, no masses, no lesions and a normal meatus. The scrotum is without lesions. Examination of the right scrotum demonstrates a hydrocele. The right epididymis is palpably normal and non-tender. The left epididymis is palpably normal and non-tender. The right testis is non-tender and without masses. The left testis is non-tender and without masses.  Lymphatics: The femoral and inguinal nodes are not enlarged or tender.  Skin: Normal skin turgor, no visible rash and no visible skin lesions.  Neuro/Psych:. Mood and affect are appropriate.   Results/Data Urine  COLOR YELLOW  APPEARANCE CLEAR  SPECIFIC GRAVITY 1.020  pH 5.5  GLUCOSE NEG mg/dL BILIRUBIN NEG  KETONE NEG mg/dL BLOOD TRACE  PROTEIN NEG mg/dL UROBILINOGEN 0.2 mg/dL NITRITE NEG  LEUKOCYTE ESTERASE NEG  SQUAMOUS EPITHELIAL/HPF NONE SEEN  WBC 0-2 WBC/hpf RBC 0-2 RBC/hpf BACTERIA NONE SEEN  CRYSTALS NONE SEEN  CASTS NONE SEEN   The following clinical lab reports were reviewed:  His urine is clear.    Assessment Assessed  1. Erectile dysfunction following radical prostatectomy (N52.31)  I went over the procedure of inflatable penile prosthesis implantation with him today. I showed him a model of the device as well as have given him written information about the procedure. We briefly discussed semirigid prosthesis but he would like to consider an inflatable device. I have discussed the incision used which would be a penoscrotal incision as well as to the procedure details and anticipated outcome. We discussed the risks  and complications of this procedure including a full understanding of the fact that if the device should become infected it would very likely require its complete removal. We discussed the need for an overnight stay as well as the anticipated postoperative course and I have been answered all of his questions to his satisfaction. He had an extensive, well thought out list that he had brought with him after having done extensive research.   Plan   He will be scheduled for inflatable penile prosthesis implantation.

## 2014-10-28 NOTE — Transfer of Care (Signed)
Last Vitals:  Filed Vitals:   10/28/14 0603  BP: 146/75  Pulse: 57  Temp: 36.8 C  Resp: 16   Immediate Anesthesia Transfer of Care Note  Patient: Cody Zamora  Procedure(s) Performed: Procedure(s) (LRB): IMPLANTATION 3 PIECE PENILE INFLATABLE PROTHESIS/COLOPLAST SCROTAL APPROACH (N/A)  Patient Location: PACU  Anesthesia Type: General  Level of Consciousness: awake, alert  and oriented  Airway & Oxygen Therapy: Patient Spontanous Breathing and Patient connected to face mask oxygen  Post-op Assessment: Report given to PACU RN and Post -op Vital signs reviewed and stable  Post vital signs: Reviewed and stable  Complications: No apparent anesthesia complications

## 2014-10-28 NOTE — Progress Notes (Signed)
Night of surgery note  Subjective: The patient is doing well.  No complaints.  Objective: Vital signs in last 24 hours: Temp:  [98.2 F (36.8 C)] 98.2 F (36.8 C) (07/11 0603) Pulse Rate:  [57-89] 69 (07/11 1145) Resp:  [10-22] 14 (07/11 1145) BP: (123-146)/(64-83) 123/73 mmHg (07/11 1145) SpO2:  [93 %-99 %] 96 % (07/11 1145) Weight:  [83.462 kg (184 lb)] 83.462 kg (184 lb) (07/11 0603)  Intake/Output this shift: Total I/O In: 1020 [P.O.:220; I.V.:800] Out: 100 [Blood:100]  Physical Exam:  General: Alert and oriented. Abdomen: Soft, Nondistended. Incision:  Dry, intact dressing in place. Glans - normal with foley indwelling.   Lab Results:  Recent Labs  10/28/14 0646  HGB 16.0  HCT 47.0    Assessment/Plan: 1) Continue to monitor 2) Per orders   Breck Hollinger C. Karsten Ro, MD  Claybon Jabs 10/28/2014, 11:52 AM

## 2014-10-29 ENCOUNTER — Encounter (HOSPITAL_BASED_OUTPATIENT_CLINIC_OR_DEPARTMENT_OTHER): Payer: Self-pay | Admitting: Urology

## 2014-10-29 DIAGNOSIS — Z8546 Personal history of malignant neoplasm of prostate: Secondary | ICD-10-CM | POA: Diagnosis not present

## 2014-10-29 DIAGNOSIS — N393 Stress incontinence (female) (male): Secondary | ICD-10-CM | POA: Diagnosis not present

## 2014-10-29 DIAGNOSIS — N5234 Erectile dysfunction following simple prostatectomy: Secondary | ICD-10-CM | POA: Diagnosis not present

## 2014-10-29 DIAGNOSIS — E118 Type 2 diabetes mellitus with unspecified complications: Secondary | ICD-10-CM | POA: Diagnosis not present

## 2014-10-29 DIAGNOSIS — I10 Essential (primary) hypertension: Secondary | ICD-10-CM | POA: Diagnosis not present

## 2014-10-29 DIAGNOSIS — E291 Testicular hypofunction: Secondary | ICD-10-CM | POA: Diagnosis not present

## 2014-10-29 MED ORDER — AMOXICILLIN-POT CLAVULANATE 875-125 MG PO TABS: 1.0000 | ORAL_TABLET | Freq: Two times a day (BID) | ORAL | Status: DC

## 2014-10-29 MED ORDER — OXYCODONE HCL 10 MG PO TABS: 10.0000 mg | ORAL_TABLET | ORAL | Status: DC | PRN

## 2014-10-29 NOTE — Discharge Summary (Signed)
Physician Discharge Summary  Patient ID: Cody Zamora MRN: [id number] DOB/AGE: Mar 16, 1951 64 y.o.  Admit date: 10/28/2014 Discharge date: 10/29/2014  Admission Diagnoses:  Discharge Diagnoses:  Active Problems:   Erectile dysfunction following radical prostatectomy   Discharged Condition: good  Hospital Course: He underwent elective inflatable penile prosthesis implantation without complication. The night of his surgery appeared to be doing well with no significant discomfort and the following day he was doing well and ready for discharge.   Discharge Exam: Blood pressure 129/75, pulse 61, temperature 98.1 F (36.7 C), temperature source Oral, resp. rate 16, height 5\' 5"  (1.651 m), weight 83.462 kg (184 lb), SpO2 100 %. He is alert, oriented and in no distress. His abdomen is soft and nontender. His dressing remains dry and intact. The glans appears normal with Foley catheter indwelling draining clear urine.  Disposition:  He is felt ready for discharge home at this time.  Discharge Instructions    Discharge patient    Complete by:  As directed             Medication List    TAKE these medications        amoxicillin-clavulanate 875-125 MG per tablet  Commonly known as:  AUGMENTIN  Take 1 tablet by mouth 2 (two) times daily.     aspirin 81 MG tablet  Take 81 mg by mouth daily.     BENEFIBER DRINK MIX PO  Take 1 Package by mouth daily.     ezetimibe 10 MG tablet  Commonly known as:  ZETIA  TAKE 1 TABLET DAILY     Fish Oil 1000 MG Caps  Take 2 capsules by mouth daily.     FLONASE 50 MCG/ACT nasal spray  Generic drug:  fluticasone  Place 1 spray into both nostrils daily as needed for allergies.     LEVITRA 20 MG tablet  Generic drug:  vardenafil  TAKE 1 TABLET DAILY AS NEEDED FOR ERECTILE DYSFUNCTION     metFORMIN 500 MG 24 hr tablet  Commonly known as:  GLUCOPHAGE-XR  TAKE 2 TABLETS TWICE A DAY WITH MEALS AS DIRECTED     multivitamin with minerals  tablet  Take 1 tablet by mouth daily.     Oxycodone HCl 10 MG Tabs  Take 1 tablet (10 mg total) by mouth every 4 (four) hours as needed.     valsartan 320 MG tablet  Commonly known as:  DIOVAN  Take 1 tablet (320 mg total) by mouth daily.     Vitamin D3 2000 UNITS capsule  Take 6,000 Units by mouth daily.           Follow-up Information    Follow up with Claybon Jabs, MD On 11/05/2014.   Specialty:  Urology   Why:  For your appiontment at 9:30   Contact information:   [address] [place] [id number] [phone]       Signed: Claybon Jabs 10/29/2014, 6:54 AM

## 2014-11-05 DIAGNOSIS — N390 Urinary tract infection, site not specified: Secondary | ICD-10-CM | POA: Diagnosis not present

## 2014-11-05 DIAGNOSIS — N5231 Erectile dysfunction following radical prostatectomy: Secondary | ICD-10-CM | POA: Diagnosis not present

## 2014-11-25 ENCOUNTER — Encounter: Payer: Self-pay | Admitting: Internal Medicine

## 2014-11-25 ENCOUNTER — Encounter: Payer: Self-pay | Admitting: Gastroenterology

## 2014-11-25 ENCOUNTER — Ambulatory Visit (INDEPENDENT_AMBULATORY_CARE_PROVIDER_SITE_OTHER): Payer: Medicare Other | Admitting: Internal Medicine

## 2014-11-25 VITALS — BP 120/82 | HR 88 | Temp 98.2°F | Resp 18 | Ht 65.5 in | Wt 183.0 lb

## 2014-11-25 DIAGNOSIS — R52 Pain, unspecified: Secondary | ICD-10-CM

## 2014-11-25 DIAGNOSIS — L0390 Cellulitis, unspecified: Secondary | ICD-10-CM

## 2014-11-25 DIAGNOSIS — L0291 Cutaneous abscess, unspecified: Secondary | ICD-10-CM

## 2014-11-25 MED ORDER — DOXYCYCLINE HYCLATE 100 MG PO CAPS: 100.0000 mg | ORAL_CAPSULE | Freq: Two times a day (BID) | ORAL | Status: DC

## 2014-11-25 MED ORDER — HYDROCODONE-ACETAMINOPHEN 5-325 MG PO TABS: 1.0000 | ORAL_TABLET | ORAL | Status: DC | PRN

## 2014-11-25 MED ORDER — CEFTRIAXONE SODIUM 1 G IJ SOLR
1.0000 g | Freq: Once | INTRAMUSCULAR | Status: AC
Start: 2014-11-25 — End: 2014-11-25
  Administered 2014-11-25: 1 g via INTRAMUSCULAR

## 2014-11-25 NOTE — Patient Instructions (Signed)
Abscess °An abscess is an infected area that contains a collection of pus and debris. It can occur in almost any part of the body. An abscess is also known as a furuncle or boil. °CAUSES  °An abscess occurs when tissue gets infected. This can occur from blockage of oil or sweat glands, infection of hair follicles, or a minor injury to the skin. As the body tries to fight the infection, pus collects in the area and creates pressure under the skin. This pressure causes pain. People with weakened immune systems have difficulty fighting infections and get certain abscesses more often.  °SYMPTOMS °Usually an abscess develops on the skin and becomes a painful mass that is red, warm, and tender. If the abscess forms under the skin, you may feel a moveable soft area under the skin. Some abscesses break open (rupture) on their own, but most will continue to get worse without care. The infection can spread deeper into the body and eventually into the bloodstream, causing you to feel ill.  °DIAGNOSIS  °Your caregiver will take your medical history and perform a physical exam. A sample of fluid may also be taken from the abscess to determine what is causing your infection. °TREATMENT  °Your caregiver may prescribe antibiotic medicines to fight the infection. However, taking antibiotics alone usually does not cure an abscess. Your caregiver may need to make a small cut (incision) in the abscess to drain the pus. In some cases, gauze is packed into the abscess to reduce pain and to continue draining the area. °HOME CARE INSTRUCTIONS  °· Only take over-the-counter or prescription medicines for pain, discomfort, or fever as directed by your caregiver. °· If you were prescribed antibiotics, take them as directed. Finish them even if you start to feel better. °· If gauze is used, follow your caregiver's directions for changing the gauze. °· To avoid spreading the infection: °· Keep your draining abscess covered with a  bandage. °· Wash your hands well. °· Do not share personal care items, towels, or whirlpools with others. °· Avoid skin contact with others. °· Keep your skin and clothes clean around the abscess. °· Keep all follow-up appointments as directed by your caregiver. °SEEK MEDICAL CARE IF:  °· You have increased pain, swelling, redness, fluid drainage, or bleeding. °· You have muscle aches, chills, or a general ill feeling. °· You have a fever. °MAKE SURE YOU:  °· Understand these instructions. °· Will watch your condition. °· Will get help right away if you are not doing well or get worse. °Document Released: 01/13/2005 Document Revised: 10/05/2011 Document Reviewed: 06/18/2011 °[employer]® Patient Information ©2015 [employer]. This information is not intended to replace advice given to you by your health care provider. Make sure you discuss any questions you have with your health care provider. ° °Abscess °Care After °An abscess (also called a boil or furuncle) is an infected area that contains a collection of pus. Signs and symptoms of an abscess include pain, tenderness, redness, or hardness, or you may feel a moveable soft area under your skin. An abscess can occur anywhere in the body. The infection may spread to surrounding tissues causing cellulitis. A cut (incision) by the surgeon was made over your abscess and the pus was drained out. Gauze may have been packed into the space to provide a drain that will allow the cavity to heal from the inside outwards. The boil may be painful for 5 to 7 days. Most people with a boil do not have   high fevers. Your abscess, if seen early, may not have localized, and may not have been lanced. If not, another appointment may be required for this if it does not get better on its own or with medications. °HOME CARE INSTRUCTIONS  °· Only take over-the-counter or prescription medicines for pain, discomfort, or fever as directed by your caregiver. °· When you bathe, soak and then  remove gauze or iodoform packs at least daily or as directed by your caregiver. You may then wash the wound gently with mild soapy water. Repack with gauze or do as your caregiver directs. °SEEK IMMEDIATE MEDICAL CARE IF:  °· You develop increased pain, swelling, redness, drainage, or bleeding in the wound site. °· You develop signs of generalized infection including muscle aches, chills, fever, or a general ill feeling. °· An oral temperature above 102° F (38.9° C) develops, not controlled by medication. °See your caregiver for a recheck if you develop any of the symptoms described above. If medications (antibiotics) were prescribed, take them as directed. °Document Released: 10/22/2004 Document Revised: 06/28/2011 Document Reviewed: 06/19/2007 °[employer]® Patient Information ©2015 [employer]. This information is not intended to replace advice given to you by your health care provider. Make sure you discuss any questions you have with your health care provider. ° °

## 2014-11-25 NOTE — Progress Notes (Signed)
   Subjective:    Patient ID: Cody Zamora, male    DOB: 1950-10-17, 64 y.o.   MRN: [id number]  HPI  Patient reports that he has been having some sores and ingrown hairs in the genital areas and also around the rectum and he reports that he has gotten to the point where it is literally so painful that he cannot sit down.  He reports that he has been using neosporin and has also but picking at it.  He reports that he has not had any drainage that he has noticed.  He did pop a spot that was ingrown like a pimple.  He got some pus and blood out of it.  He reports that it is slightly better, but it is still painful.  Never had an issue like this in the past.  His wife has a similar thing happening.  He reports that his blood sugars have been going up significantly.     Review of Systems  Constitutional: Positive for diaphoresis. Negative for fever, chills and fatigue.  Gastrointestinal: Negative for nausea and vomiting.  Skin: Positive for rash and wound.       Objective:   Physical Exam  Constitutional: He is oriented to person, place, and time. He appears well-developed and well-nourished. No distress.  HENT:  Head: Normocephalic and atraumatic.  Mouth/Throat: Oropharynx is clear and moist. No oropharyngeal exudate.  Eyes: Conjunctivae are normal. No scleral icterus.  Neck: Normal range of motion. Neck supple. No JVD present. No thyromegaly present.  Genitourinary:        Musculoskeletal: Normal range of motion.  Lymphadenopathy:    He has no cervical adenopathy.  Neurological: He is alert and oriented to person, place, and time.  Skin: Skin is warm and dry. He is not diaphoretic.  Psychiatric: He has a normal mood and affect. His behavior is normal. Judgment and thought content normal.  Nursing note and vitals reviewed.   Filed Vitals:   11/25/14 1603  BP: 120/82  Pulse: 88  Temp: 98.2 F (36.8 C)  Resp: 18    Procedures:  I&D of the right groin Patient was  prepped with isopropyl alcohol and draped with a non-sterile dressing to avoid drainage on clothes.  The area was numbed with 1.5 cc 1% lidocaine without epinephrine.  Adequate skin numbing attained.  1 cm linear incision made and was explored with hemostats to break up loculations. There was purulent and sanguinous drainage.  Wound was left open with no packing.  It was covered with non-adherent 4x4 and paper tape.        Assessment & Plan:    1. Abscess of multiple sites -abscess on right groin I&D'd in office with some purulent and sanguinous drainage - cefTRIAXone (ROCEPHIN) injection 1 g; Inject 1 g into the muscle once. -doxycycline given recent hospitalization -hydrocodone #20 for pain relief. -warm compresses as often as tolerated. -recheck in 2-3 days  Patient given strict instructions to go to the ER if intractable fevers, spreading redness, or severe nausea and vomiting.  He stated understanding.

## 2014-11-27 ENCOUNTER — Ambulatory Visit (INDEPENDENT_AMBULATORY_CARE_PROVIDER_SITE_OTHER): Payer: Medicare Other | Admitting: Internal Medicine

## 2014-11-27 ENCOUNTER — Encounter: Payer: Self-pay | Admitting: Internal Medicine

## 2014-11-27 VITALS — BP 124/78 | HR 92 | Temp 98.0°F | Resp 18 | Ht 65.5 in

## 2014-11-27 DIAGNOSIS — L0390 Cellulitis, unspecified: Secondary | ICD-10-CM

## 2014-11-27 DIAGNOSIS — L0291 Cutaneous abscess, unspecified: Secondary | ICD-10-CM

## 2014-11-27 MED ORDER — DOXYCYCLINE HYCLATE 100 MG PO CAPS: 100.0000 mg | ORAL_CAPSULE | Freq: Two times a day (BID) | ORAL | Status: DC

## 2014-11-27 NOTE — Progress Notes (Signed)
   Subjective:    Patient ID: Cody Zamora, male    DOB: Sep 12, 1950, 64 y.o.   MRN: [id number]  HPI  Patient presents to the office for evaluation of abscesses of the buttocks and the groin.  He has returned after two days of doxycycline and ceftriaxone injection.  He reports no further drainage or redness around the site of the I&D in the groin.  He reports that it is nearly pain free.  The abscess on the buttock is still sore but he feels that it has improved greatly.    Review of Systems  Constitutional: Negative for fever, chills and fatigue.  Respiratory: Negative for chest tightness and shortness of breath.   Gastrointestinal: Negative for nausea and vomiting.  Skin: Positive for wound.       Objective:   Physical Exam  Constitutional: He is oriented to person, place, and time. He appears well-developed and well-nourished. No distress.  HENT:  Head: Normocephalic.  Mouth/Throat: Oropharynx is clear and moist. No oropharyngeal exudate.  Eyes: Conjunctivae are normal. No scleral icterus.  Neck: Normal range of motion. Neck supple. No JVD present. No thyromegaly present.  Cardiovascular: Normal rate, regular rhythm, normal heart sounds and intact distal pulses.  Exam reveals no gallop and no friction rub.   No murmur heard. Pulmonary/Chest: Effort normal and breath sounds normal. No respiratory distress. He has no wheezes. He has no rales. He exhibits no tenderness.  Abdominal: Soft.  Lymphadenopathy:    He has no cervical adenopathy.  Neurological: He is alert and oriented to person, place, and time.  Skin: Skin is warm and dry. He is not diaphoretic.     Psychiatric: He has a normal mood and affect. His behavior is normal. Judgment and thought content normal.  Nursing note and vitals reviewed.         Assessment & Plan:     1. Abscess of multiple sites -improved -cont doxycycline -followup prn -pain medication prn -cont warm compresses

## 2014-12-11 DIAGNOSIS — N529 Male erectile dysfunction, unspecified: Secondary | ICD-10-CM | POA: Diagnosis not present

## 2015-01-11 ENCOUNTER — Other Ambulatory Visit: Payer: Self-pay | Admitting: Internal Medicine

## 2015-01-16 ENCOUNTER — Ambulatory Visit (INDEPENDENT_AMBULATORY_CARE_PROVIDER_SITE_OTHER): Payer: Medicare Other | Admitting: Physician Assistant

## 2015-01-16 ENCOUNTER — Encounter: Payer: Self-pay | Admitting: Physician Assistant

## 2015-01-16 VITALS — BP 130/72 | HR 66 | Temp 97.0°F | Resp 16 | Ht 65.5 in | Wt 189.0 lb

## 2015-01-16 DIAGNOSIS — E1129 Type 2 diabetes mellitus with other diabetic kidney complication: Secondary | ICD-10-CM

## 2015-01-16 DIAGNOSIS — I10 Essential (primary) hypertension: Secondary | ICD-10-CM | POA: Diagnosis not present

## 2015-01-16 DIAGNOSIS — E559 Vitamin D deficiency, unspecified: Secondary | ICD-10-CM

## 2015-01-16 DIAGNOSIS — Z79899 Other long term (current) drug therapy: Secondary | ICD-10-CM

## 2015-01-16 DIAGNOSIS — E782 Mixed hyperlipidemia: Secondary | ICD-10-CM | POA: Diagnosis not present

## 2015-01-16 LAB — CBC WITH DIFFERENTIAL/PLATELET
Basophils Absolute: 0 10*3/uL (ref 0.0–0.1)
Basophils Relative: 0 % (ref 0–1)
Eosinophils Absolute: 0.1 10*3/uL (ref 0.0–0.7)
Eosinophils Relative: 2 % (ref 0–5)
HCT: 45.4 % (ref 39.0–52.0)
Hemoglobin: 15 g/dL (ref 13.0–17.0)
LYMPHS ABS: 3 10*3/uL (ref 0.7–4.0)
LYMPHS PCT: 41 % (ref 12–46)
MCH: 29.1 pg (ref 26.0–34.0)
MCHC: 33 g/dL (ref 30.0–36.0)
MCV: 88 fL (ref 78.0–100.0)
MPV: 8.9 fL (ref 8.6–12.4)
Monocytes Absolute: 0.5 10*3/uL (ref 0.1–1.0)
Monocytes Relative: 7 % (ref 3–12)
NEUTROS PCT: 50 % (ref 43–77)
Neutro Abs: 3.6 10*3/uL (ref 1.7–7.7)
PLATELETS: 264 10*3/uL (ref 150–400)
RBC: 5.16 MIL/uL (ref 4.22–5.81)
RDW: 14.7 % (ref 11.5–15.5)
WBC: 7.2 10*3/uL (ref 4.0–10.5)

## 2015-01-16 LAB — LIPID PANEL
Cholesterol: 137 mg/dL (ref 125–200)
HDL: 37 mg/dL — AB (ref >=40)
LDL CALC: 84 mg/dL (ref <130)
Total CHOL/HDL Ratio: 3.7 Ratio (ref <=5.0)
Triglycerides: 81 mg/dL (ref <150)
VLDL: 16 mg/dL (ref <30)

## 2015-01-16 LAB — HEPATIC FUNCTION PANEL
ALBUMIN: 4.4 g/dL (ref 3.6–5.1)
ALK PHOS: 72 U/L (ref 40–115)
ALT: 22 U/L (ref 9–46)
AST: 22 U/L (ref 10–35)
BILIRUBIN TOTAL: 0.3 mg/dL (ref 0.2–1.2)
Bilirubin, Direct: 0.1 mg/dL (ref <=0.2)
Indirect Bilirubin: 0.2 mg/dL (ref 0.2–1.2)
Total Protein: 7.6 g/dL (ref 6.1–8.1)

## 2015-01-16 LAB — BASIC METABOLIC PANEL WITH GFR
BUN: 17 mg/dL (ref 7–25)
CHLORIDE: 103 mmol/L (ref 98–110)
CO2: 27 mmol/L (ref 20–31)
CREATININE: 1.14 mg/dL (ref 0.70–1.25)
Calcium: 9.4 mg/dL (ref 8.6–10.3)
GFR, Est African American: 77 mL/min (ref >=60)
GFR, Est Non African American: 67 mL/min (ref >=60)
Glucose, Bld: 100 mg/dL — ABNORMAL HIGH (ref 65–99)
POTASSIUM: 4.5 mmol/L (ref 3.5–5.3)
Sodium: 138 mmol/L (ref 135–146)

## 2015-01-16 LAB — HEMOGLOBIN A1C
HEMOGLOBIN A1C: 5.9 % — AB (ref <5.7)
MEAN PLASMA GLUCOSE: 123 mg/dL — AB (ref <117)

## 2015-01-16 LAB — MAGNESIUM: Magnesium: 1.8 mg/dL (ref 1.5–2.5)

## 2015-01-16 LAB — TSH: TSH: 1.396 u[IU]/mL (ref 0.350–4.500)

## 2015-01-16 MED ORDER — LORAZEPAM 1 MG PO TABS: 1.0000 mg | ORAL_TABLET | Freq: Two times a day (BID) | ORAL | Status: DC | PRN

## 2015-01-16 NOTE — Patient Instructions (Signed)
We want weight loss that will last so you should lose 1-2 pounds a week.  THAT IS IT! Please pick THREE things a month to change. Once it is a habit check off the item. Then pick another three items off the list to become habits.  If you are already doing a habit on the list GREAT!  Cross that item off! o Don't drink your calories. Ie, alcohol, soda, fruit juice, and sweet tea.  o Drink more water. Drink a glass when you feel hungry or before each meal.  o Eat breakfast - Complex carb and protein (likeDannon light and fit yogurt, oatmeal, fruit, eggs, turkey bacon). o Measure your cereal.  Eat no more than one cup a day. (ie Kashi) o Eat an apple a day. o Add a vegetable a day. o Try a new vegetable a month. o Use Pam! Stop using oil or butter to cook. o Don't finish your plate or use smaller plates. o Share your dessert. o Eat sugar free Jello for dessert or frozen grapes. o Don't eat 2-3 hours before bed. o Switch to whole wheat bread, pasta, and brown rice. o Make healthier choices when you eat out. No fries! o Pick baked chicken, NOT fried. o Don't forget to SLOW DOWN when you eat. It is not going anywhere.  o Take the stairs. o Park far away in the parking lot o Lift soup cans (or weights) for 10 minutes while watching TV. o Walk at work for 10 minutes during break. o Walk outside 1 time a week with your friend, kids, dog, or significant other. o Start a walking group at church. o Walk the mall as much as you can tolerate.  o Keep a food diary. o Weigh yourself daily. o Walk for 15 minutes 3 days per week. o Cook at home more often and eat out less.  If life happens and you go back to old habits, it is okay.  Just start over. You can do it!   If you experience chest pain, get short of breath, or tired during the exercise, please stop immediately and inform your doctor.   Before you even begin to attack a weight-loss plan, it pays to remember this: You are not fat. You have fat.  Losing weight isn't about blame or shame; it's simply another achievement to accomplish. Dieting is like any other skill-you have to buckle down and work at it. As long as you act in a smart, reasonable way, you'll ultimately get where you want to be. Here are some weight loss pearls for you.  1. It's Not a Diet. It's a Lifestyle Thinking of a diet as something you're on and suffering through only for the short term doesn't work. To shed weight and keep it off, you need to make permanent changes to the way you eat. It's OK to indulge occasionally, of course, but if you cut calories temporarily and then revert to your old way of eating, you'll gain back the weight quicker than you can say yo-yo. Use it to lose it. Research shows that one of the best predictors of long-term weight loss is how many pounds you drop in the first month. For that reason, nutritionists often suggest being stricter for the first two weeks of your new eating strategy to build momentum. Cut out added sugar and alcohol and avoid unrefined carbs. After that, figure out how you can reincorporate them in a way that's healthy and maintainable.  2. There's a Right   Way to Exercise Working out burns calories and fat and boosts your metabolism by building muscle. But those trying to lose weight are notorious for overestimating the number of calories they burn and underestimating the amount they take in. Unfortunately, your system is biologically programmed to hold on to extra pounds and that means when you start exercising, your body senses the deficit and ramps up its hunger signals. If you're not diligent, you'll eat everything you burn and then some. Use it to lose it. Cardio gets all the exercise glory, but strength and interval training are the real heroes. They help you build lean muscle, which in turn increases your metabolism and calorie-burning ability 3. Don't Overreact to Mild Hunger Some people have a hard time losing weight because  of hunger anxiety. To them, being hungry is bad-something to be avoided at all costs-so they carry snacks with them and eat when they don't need to. Others eat because they're stressed out or bored. While you never want to get to the point of being ravenous (that's when bingeing is likely to happen), a hunger pang, a craving, or the fact that it's 3:00 p.m. should not send you racing for the vending machine or obsessing about the energy bar in your purse. Ideally, you should put off eating until your stomach is growling and it's difficult to concentrate.  Use it to lose it. When you feel the urge to eat, use the HALT method. Ask yourself, Am I really hungry? Or am I angry or anxious, lonely or bored, or tired? If you're still not certain, try the apple test. If you're truly hungry, an apple should seem delicious; if it doesn't, something else is going on. Or you can try drinking water and making yourself busy, if you are still hungry try a healthy snack.  4. Not All Calories Are Created Equal The mechanics of weight loss are pretty simple: Take in fewer calories than you use for energy. But the kind of food you eat makes all the difference. Processed food that's high in saturated fat and refined starch or sugar can cause inflammation that disrupts the hormone signals that tell your brain you're full. The result: You eat a lot more.  Use it to lose it. Clean up your diet. Swap in whole, unprocessed foods, including vegetables, lean protein, and healthy fats that will fill you up and give you the biggest nutritional bang for your calorie buck. In a few weeks, as your brain starts receiving regular hunger and fullness signals once again, you'll notice that you feel less hungry overall and naturally start cutting back on the amount you eat.  5. Protein, Produce, and Plant-Based Fats Are Your Weight-Loss Trinity Here's why eating the three Ps regularly will help you drop pounds. Protein fills you up. You need it  to build lean muscle, which keeps your metabolism humming so that you can torch more fat. People in a weight-loss program who ate double the recommended daily allowance for protein (about 110 grams for a 150-pound woman) lost 70 percent of their weight from fat, while people who ate the RDA lost only about 40 percent, one study found. Produce is packed with filling fiber. "It's very difficult to consume too many calories if you're eating a lot of vegetables. Example: Three cups of broccoli is a lot of food, yet only 93 calories. (Fruit is another story. It can be easy to overeat and can contain a lot of calories from sugar, so be sure to   monitor your intake.) Plant-based fats like olive oil and those in avocados and nuts are healthy and extra satiating.  Use it to lose it. Aim to incorporate each of the three Ps into every meal and snack. People who eat protein throughout the day are able to keep weight off, according to a study in the American Journal of Clinical Nutrition. In addition to meat, poultry and seafood, good sources are beans, lentils, eggs, tofu, and yogurt. As for fat, keep portion sizes in check by measuring out salad dressing, oil, and nut butters (shoot for one to two tablespoons). Finally, eat veggies or a little fruit at every meal. People who did that consumed 308 fewer calories but didn't feel any hungrier than when they didn't eat more produce.  7. How You Eat Is As Important As What You Eat In order for your brain to register that you're full, you need to focus on what you're eating. Sit down whenever you eat, preferably at a table. Turn off the TV or computer, put down your phone, and look at your food. Smell it. Chew slowly, and don't put another bite on your fork until you swallow. When women ate lunch this attentively, they consumed 30 percent less when snacking later than those who listened to an audiobook at lunchtime, according to a study in the British Journal of Nutrition. 8.  Weighing Yourself Really Works The scale provides the best evidence about whether your efforts are paying off. Seeing the numbers tick up or down or stagnate is motivation to keep going-or to rethink your approach. A 2015 study at [employer] found that daily weigh-ins helped people lose more weight, keep it off, and maintain that loss, even after two years. Use it to lose it. Step on the scale at the same time every day for the best results. If your weight shoots up several pounds from one weigh-in to the next, don't freak out. Eating a lot of salt the night before or having your period is the likely culprit. The number should return to normal in a day or two. It's a steady climb that you need to do something about. 9. Too Much Stress and Too Little Sleep Are Your Enemies When you're tired and frazzled, your body cranks up the production of cortisol, the stress hormone that can cause carb cravings. Not getting enough sleep also boosts your levels of ghrelin, a hormone associated with hunger, while suppressing leptin, a hormone that signals fullness and satiety. People on a diet who slept only five and a half hours a night for two weeks lost 55 percent less fat and were hungrier than those who slept eight and a half hours, according to a study in the [employer] Journal. Use it to lose it. Prioritize sleep, aiming for seven hours or more a night, which research shows helps lower stress. And make sure you're getting quality zzz's. If a snoring spouse or a fidgety cat wakes you up frequently throughout the night, you may end up getting the equivalent of just four hours of sleep, according to a study from [employer]. Keep pets out of the bedroom, and use a white-noise app to drown out snoring. 10. You Will Hit a plateau-And You Can Bust Through It As you slim down, your body releases much less leptin, the fullness hormone.  If you're not strength training, start right now.  Building muscle can raise your metabolism to help you overcome a plateau. To keep your body challenged   and burning calories, incorporate new moves and more intense intervals into your workouts or add another sweat session to your weekly routine. Alternatively, cut an extra 100 calories or so a day from your diet. Now that you've lost weight, your body simply doesn't need as much fuel.   Recommendations For Diabetic/Prediabetic Patients:   -  Take medications as prescribed  -  Recommend Dr Fara Olden Fuhrman's book "The End of Diabetes "  And "The End of Dieting"- Can get at  [email or web address] and encourage also get the Audio CD book  - AVOID Animal products, ie. Meat - red/white, Poultry and Dairy/especially cheese - Exercise at least 5 times a week for 30 minutes or preferably daily.  - No Smoking - Drink less than 2 drinks a day.  - Monitor your feet for sores - Have yearly Eye Exams - Recommend annual Flu vaccine  - Recommend Pneumovax and Prevnar vaccines - Shingles Vaccine (Zostavax) if over 66 y.o.  Goals:   - BMI less than 24 - Fasting sugar less than 130 or less than 150 if tapering medicines to lose weight  - Systolic BP less than 290  - Diastolic BP less than 80 - Bad LDL Cholesterol less than 70 - Triglycerides less than 150

## 2015-01-16 NOTE — Progress Notes (Signed)
Assessment and Plan:  1. Hypertension -Continue medication, monitor blood pressure at home. Continue DASH diet.  Reminder to go to the ER if any CP, SOB, nausea, dizziness, severe HA, changes vision/speech, left arm numbness and tingling and jaw pain.  2. Cholesterol -Continue diet and exercise. Check cholesterol.   3. Diabetes with diabetic chronic kidney disease -Continue diet and exercise. Check A1C  4. Vitamin D Def - check level and continue medications.   5. Anxiety/insomnia Increased stress/anxiety with wife, Linda's health and being primary care given.  Discussed adding on low dose SSRI but would prefer PRN medication at this time, Ativan 1 mg 1/2-1 pill at night PRN, #60 NR, discussed addictive nature and take as needed, if changes his mind about SSRI will call the office.   Continue diet and meds as discussed. Further disposition pending results of labs. Discussed med's effects and SE's.    Over 30 minutes of exam, counseling, chart review, and critical decision making was performed   HPI 64 y.o. male  presents for 3 month follow up on hypertension, cholesterol, diabetes and vitamin D deficiency.   His blood pressure has been controlled at home, today his BP is BP: 130/72 mmHg.  He does not workout. He denies chest pain, shortness of breath, dizziness.  He is on cholesterol medication and denies myalgias. His cholesterol is at goal. The cholesterol was:   Lab Results  Component Value Date   CHOL 123 10/16/2014   HDL 33* 10/16/2014   LDLCALC 73 10/16/2014   TRIG 86 10/16/2014   CHOLHDL 3.7 10/16/2014    He has been working on diet and exercise for diabetes with diabetic chronic kidney disease, he is on bASA, he is on ACE/ARB, and denies  paresthesia of the feet, polydipsia, polyuria and visual disturbances. Last A1C was:  Lab Results  Component Value Date   HGBA1C 6.0* 10/16/2014   Lab Results  Component Value Date   GFRAA 71 10/16/2014    Patient is on Vitamin D  supplement. Lab Results  Component Value Date   VD25OH 117* 10/16/2014   Increased stress with his wife, she is blind in left eye and right had surgery for DM retinopathy, and he has become a full time care given. 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   Wt Readings from Last 3 Encounters:  01/16/15 189 lb (85.73 kg)  11/25/14 183 lb (83.008 kg)  10/28/14 184 lb (83.462 kg)   Current Medications:  Current Outpatient Prescriptions on File Prior to Visit  Medication Sig Dispense Refill  . aspirin 81 MG tablet Take 81 mg by mouth daily.      . Cholecalciferol (VITAMIN D3) 2000 UNITS capsule Take 6,000 Units by mouth daily.    Marland Kitchen ezetimibe (ZETIA) 10 MG tablet TAKE 1 TABLET DAILY (Patient taking differently: Take 5 mg by mouth every morning. TAKE 1 TABLET DAILY) 90 tablet 4  . fluticasone (FLONASE) 50 MCG/ACT nasal spray Place 1 spray into both nostrils daily as needed for allergies.     Marland Kitchen LEVITRA 20 MG tablet TAKE 1 TABLET DAILY AS NEEDED FOR ERECTILE DYSFUNCTION 30 tablet 99  . metFORMIN (GLUCOPHAGE-XR) 500 MG 24 hr tablet TAKE 2 TABLETS TWICE A DAY WITH MEALS AS DIRECTED (Patient taking differently: Take 1,000 mg by mouth 2 (two) times daily. ) 360 tablet 4  . Multiple Vitamins-Minerals (MULTIVITAMIN WITH MINERALS) tablet Take 1  tablet by mouth daily.      . Omega-3 Fatty Acids (FISH OIL) 1000 MG CAPS Take 2 capsules by mouth daily.    . valsartan (DIOVAN) 320 MG tablet Take 1 tablet (320 mg total) by mouth daily. (Patient taking differently: Take 320 mg by mouth every morning. ) 90 tablet 4  . Wheat Dextrin (BENEFIBER DRINK MIX PO) Take 1 Package by mouth daily.      No current facility-administered medications on file prior to visit.   Medical History:  Past Medical History  Diagnosis Date  . Hyperlipidemia   . Hypertension   . Hypogonadism male   . Mixed hyperlipidemia   . Type 2  diabetes mellitus   . Sigmoid diverticulosis     MILD  . History of colon polyps   . History of kidney stones   . ED (erectile dysfunction) of organic origin   . History of prostate cancer     DEC 2008--  S/P  RADICAL PROSTATECTOMY  . History of positive PPD     06/ 2006--  CXR NORMAL  . Nocturia    Allergies:  Allergies  Allergen Reactions  . Cialis [Tadalafil] Other (See Comments)    Hot flashes  . Fructose Nausea And Vomiting  . Morphine And Related Itching     Review of Systems:  Review of Systems  Constitutional: Negative.   HENT: Negative.   Eyes: Negative.   Respiratory: Negative.   Cardiovascular: Negative.   Gastrointestinal: Positive for diarrhea and constipation. Negative for heartburn, nausea, vomiting, abdominal pain, blood in stool and melena.  Genitourinary: Negative.   Musculoskeletal: Negative.   Skin: Negative.   Neurological: Negative.   Endo/Heme/Allergies: Negative.   Psychiatric/Behavioral: Positive for depression. Negative for suicidal ideas, hallucinations, memory loss and substance abuse. The patient is nervous/anxious and has insomnia.     Family history- Review and unchanged Social history- Review and unchanged Physical Exam: BP 130/72 mmHg  Pulse 66  Temp(Src) 97 F (36.1 C) (Temporal)  Resp 16  Ht 5' 5.5" (1.664 m)  Wt 189 lb (85.73 kg)  BMI 30.96 kg/m2  SpO2 94% Wt Readings from Last 3 Encounters:  01/16/15 189 lb (85.73 kg)  11/25/14 183 lb (83.008 kg)  10/28/14 184 lb (83.462 kg)   General Appearance: Well nourished, in no apparent distress. Eyes: PERRLA, EOMs, conjunctiva no swelling or erythema Sinuses: No Frontal/maxillary tenderness ENT/Mouth: Ext aud canals clear, TMs without erythema, bulging. No erythema, swelling, or exudate on post pharynx.  Tonsils not swollen or erythematous. Hearing normal.  Neck: Supple, thyroid normal.  Respiratory: Respiratory effort normal, BS equal bilaterally without rales, rhonchi, wheezing  or stridor.  Cardio: RRR with no MRGs. Brisk peripheral pulses without edema.  Abdomen: Soft, + BS.  Non tender, no guarding, rebound, hernias, masses. Lymphatics: Non tender without lymphadenopathy.  Musculoskeletal: Full ROM, 5/5 strength, Normal gait Skin: Warm, dry without rashes, lesions, ecchymosis.  Neuro: Cranial nerves intact. No cerebellar symptoms.  Psych: Awake and oriented X 3, normal affect, Insight and Judgment appropriate.    Vicie Mutters, PA-C 8:54 AM [facility]

## 2015-01-17 LAB — VITAMIN D 25 HYDROXY (VIT D DEFICIENCY, FRACTURES): VIT D 25 HYDROXY: 68 ng/mL (ref 30–100)

## 2015-04-13 ENCOUNTER — Other Ambulatory Visit: Payer: Self-pay | Admitting: Internal Medicine

## 2015-04-21 ENCOUNTER — Encounter: Payer: Self-pay | Admitting: Internal Medicine

## 2015-04-21 NOTE — Patient Instructions (Signed)

## 2015-04-21 NOTE — Progress Notes (Signed)
Patient ID: Cody Zamora, male   DOB: 1950/10/14, 65 y.o.   MRN: [id number]   This very nice 65 y.o. MBM presents for 6 month follow up with Hypertension, Hyperlipidemia, Pre-Diabetes and Vitamin D Deficiency. Patient also has remote hx/o Prostate Ca (2008). Long discussion re: his stressors as his wife has just started dialysis and the extra responsibilities he now has.    Patient is treated for HTN since 1996 & BP has been controlled at home. Today's BP: 130/82 mmHg. Patient has had no complaints of any cardiac type chest pain, palpitations, dyspnea/orthopnea/PND, dizziness, claudication, or dependent edema.   Hyperlipidemia is controlled with diet & Zetia (Statin intolerant with elevated CPK). Patient denies myalgias or other med SE's. Last Lipids were at goal with Cholesterol 137; HDL 37*; LDL 84; Triglycerides 81 on 01/16/2015.   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  CBG's running less than 130. Last A1c was  5.9% on 01/16/2015.   Further, the patient also has history of Vitamin D Deficiency of "11" in 2008 and supplements vitamin D without any suspected side-effects. Last vitamin D was 68 on 01/16/2015.  Medication Sig  . aspirin 81 MG  Take 81 mg by mouth daily.    Marland Kitchen VITAMIN D 2000 UNITS  Take 6,000 Units by mouth daily.  Marland Kitchen FLONASE  nasal spray Place 1 spray into both nostrils daily as needed for allergies.   Marland Kitchen LEVITRA 20 MG TAKE 1 TABLET DAILY AS NEEDED FOR ERECTILE DYSFUNCTION  . LORazepam 1 MG  Take 1 tablet (1 mg total) by mouth 2 (two) times daily as needed for anxiety.  . metFORMIN -XR 500 MG  TAKE 2 TABLETS TWICE A DAY WITH MEALS AS DIRECTED  . Multiple Vitamins-Minerals  Take 1 tablet by mouth daily.    . Omega-3 FISH OIL 1000 MG  Take 2 capsules by mouth daily.  . valsartan (DIOVAN) 320 MG Take 1 tablet (320 mg total) by  mouth daily  . BENEFIBER DRINK MIX  Take 1 Package by mouth daily.   Marland Kitchen ZETIA 10 MG  TAKE 1 TABLET DAILY   Allergies  Allergen Reactions  . Cialis [Tadalafil] Other (See Comments)    Hot flashes  . Fructose Nausea And Vomiting  . Morphine And Related Itching   PMHx:   Past Medical History  Diagnosis Date  . Hyperlipidemia   . Hypertension   . Hypogonadism male   . Mixed hyperlipidemia   . Type 2 diabetes mellitus ([place])   . Sigmoid diverticulosis     MILD  . History of colon polyps   . History of kidney stones   . ED (erectile dysfunction) of organic origin   . History of prostate cancer     DEC 2008--  S/P  RADICAL PROSTATECTOMY  . History of positive PPD     06/ 2006--  CXR NORMAL  . Nocturia    Immunization History  Administered Date(s) Administered  . DTaP 02/17/2002  . Influenza Whole 01/08/2013  . Influenza, High Dose Seasonal PF 02/26/2014  . Pneumococcal Polysaccharide-23 02/18/1996  . Tdap 06/29/2011  . Zoster 03/23/2011   Past Surgical History  Procedure Laterality Date  . Finger amputation  age 68    left Middle  . Robot assisted laparoscopic radical prostatectomy  Dec 2008  . Colonoscopy N/A last one 10-28-2010     due  10 yr f/u in 2022 Deatra Ina  . Percutaneous nephrolithotripsy Left 04-04-2009// second look 04-15-2009  . Ureterolithotomy  2002  . Shoulder open rotator cuff repair Right 05-11-2006  . Achilles tendon repair Right 1989  . Negative sleep study  2010  per pt  . Penile prosthesis implant N/A 10/28/2014    Procedure: IMPLANTATION 3 PIECE PENILE INFLATABLE PROTHESIS/COLOPLAST SCROTAL APPROACH;  Surgeon: Kathie Rhodes, MD;  Location: [facility];  Service: Urology;  Laterality: N/A;   FHx:    Reviewed / unchanged  SHx:    Reviewed / unchanged  Systems Review:  Constitutional: Denies fever, chills, wt changes, headaches, insomnia, fatigue, night sweats, change in appetite. Eyes: Denies redness, blurred vision, diplopia,  discharge, itchy, watery eyes.  ENT: Denies discharge, congestion, post nasal drip, epistaxis, sore throat, earache, hearing loss, dental pain, tinnitus, vertigo, sinus pain, snoring.  CV: Denies chest pain, palpitations, irregular heartbeat, syncope, dyspnea, diaphoresis, orthopnea, PND, claudication or edema. Respiratory: denies cough, dyspnea, DOE, pleurisy, hoarseness, laryngitis, wheezing.  Gastrointestinal: Denies dysphagia, odynophagia, heartburn, reflux, water brash, abdominal pain or cramps, nausea, vomiting, bloating, diarrhea, constipation, hematemesis, melena, hematochezia  or hemorrhoids. Genitourinary: Denies dysuria, frequency, urgency, nocturia, hesitancy, discharge, hematuria or flank pain. Musculoskeletal: Denies arthralgias, myalgias, stiffness, jt. swelling, pain, limping or strain/sprain.  Skin: Denies pruritus, rash, hives, warts, acne, eczema or change in skin lesion(s). Neuro: No weakness, tremor, incoordination, spasms, paresthesia or pain. Psychiatric: Denies confusion, memory loss or sensory loss. Endo: Denies change in weight, skin or hair change.  Heme/Lymph: No excessive bleeding, bruising or enlarged lymph nodes.  Physical Exam  BP 130/82 mmHg  Pulse 72  Temp(Src) 98.2 F (36.8 C)  Resp 16  Ht 5' 5.5" (1.664 m)  Wt 192 lb (87.091 kg)  BMI 31.45 kg/m2  Appears well nourished and in no distress. Eyes: PERRLA, EOMs, conjunctiva no swelling or erythema. Sinuses: No frontal/maxillary tenderness ENT/Mouth: EAC's clear, TM's nl w/o erythema, bulging. Nares clear w/o erythema, swelling, exudates. Oropharynx clear without erythema or exudates. Oral hygiene is good. Tongue normal, non obstructing. Hearing intact.  Neck: Supple. Thyroid nl. Car 2+/2+ without bruits, nodes or JVD. Chest: Respirations nl with BS clear & equal w/o rales, rhonchi, wheezing or stridor.  Cor: Heart sounds normal w/ regular rate and rhythm without sig. murmurs, gallops, clicks, or rubs.  Peripheral pulses normal and equal  without edema.  Abdomen: Soft & bowel sounds normal. Non-tender w/o guarding, rebound, hernias, masses, or organomegaly.  Lymphatics: Unremarkable.  Musculoskeletal: Full ROM all peripheral extremities, joint stability, 5/5 strength, and normal gait.  Skin: Warm, dry without exposed rashes, lesions or ecchymosis apparent.  Neuro: Cranial nerves intact, reflexes equal bilaterally. Sensory-motor testing grossly intact. Tendon reflexes grossly intact.  Pysch: Alert & oriented x 3.  Insight and judgement nl & appropriate. No ideations.  Assessment and Plan:  1. Essential hypertension  - TSH  2. Mixed hyperlipidemia  - Lipid panel - TSH  3. Type 2 diabetes mellitus with stage 2 chronic kidney disease, without long-term current use of insulin ([name])  - Hemoglobin A1c - Insulin, random  4. Vitamin D deficiency  - VITAMIN D 25 Hydroxy   5. Medication management  - CBC with Differential/Platelet - BASIC METABOLIC PANEL WITH GFR - Hepatic function panel - Magnesium   Recommended regular exercise, BP monitoring, weight control, and discussed med and SE's. Recommended labs to assess and monitor clinical status. Further disposition pending results of labs. Over 30 minutes of exam, counseling, chart review  was performed

## 2015-04-22 ENCOUNTER — Encounter: Payer: Self-pay | Admitting: Internal Medicine

## 2015-04-22 ENCOUNTER — Ambulatory Visit (INDEPENDENT_AMBULATORY_CARE_PROVIDER_SITE_OTHER): Payer: Medicare Other | Admitting: Internal Medicine

## 2015-04-22 VITALS — BP 130/82 | HR 72 | Temp 98.2°F | Resp 16 | Ht 65.5 in | Wt 192.0 lb

## 2015-04-22 DIAGNOSIS — E1122 Type 2 diabetes mellitus with diabetic chronic kidney disease: Secondary | ICD-10-CM

## 2015-04-22 DIAGNOSIS — Z79899 Other long term (current) drug therapy: Secondary | ICD-10-CM | POA: Diagnosis not present

## 2015-04-22 DIAGNOSIS — E782 Mixed hyperlipidemia: Secondary | ICD-10-CM

## 2015-04-22 DIAGNOSIS — I10 Essential (primary) hypertension: Secondary | ICD-10-CM

## 2015-04-22 DIAGNOSIS — E559 Vitamin D deficiency, unspecified: Secondary | ICD-10-CM

## 2015-04-22 DIAGNOSIS — N182 Chronic kidney disease, stage 2 (mild): Secondary | ICD-10-CM | POA: Diagnosis not present

## 2015-04-22 DIAGNOSIS — E1129 Type 2 diabetes mellitus with other diabetic kidney complication: Secondary | ICD-10-CM | POA: Diagnosis not present

## 2015-04-22 LAB — HEPATIC FUNCTION PANEL
ALT: 22 U/L (ref 9–46)
AST: 25 U/L (ref 10–35)
Albumin: 4.5 g/dL (ref 3.6–5.1)
Alkaline Phosphatase: 73 U/L (ref 40–115)
BILIRUBIN DIRECT: 0.1 mg/dL (ref <=0.2)
BILIRUBIN TOTAL: 0.5 mg/dL (ref 0.2–1.2)
Indirect Bilirubin: 0.4 mg/dL (ref 0.2–1.2)
Total Protein: 7.6 g/dL (ref 6.1–8.1)

## 2015-04-22 LAB — LIPID PANEL
CHOL/HDL RATIO: 4.6 ratio (ref <=5.0)
CHOLESTEROL: 158 mg/dL (ref 125–200)
HDL: 34 mg/dL — AB (ref >=40)
LDL Cholesterol: 105 mg/dL (ref <130)
TRIGLYCERIDES: 93 mg/dL (ref <150)
VLDL: 19 mg/dL (ref <30)

## 2015-04-22 LAB — CBC WITH DIFFERENTIAL/PLATELET
BASOS PCT: 0 % (ref 0–1)
Basophils Absolute: 0 10*3/uL (ref 0.0–0.1)
EOS ABS: 0.1 10*3/uL (ref 0.0–0.7)
Eosinophils Relative: 2 % (ref 0–5)
HCT: 45.3 % (ref 39.0–52.0)
Hemoglobin: 15.8 g/dL (ref 13.0–17.0)
LYMPHS ABS: 2.8 10*3/uL (ref 0.7–4.0)
Lymphocytes Relative: 39 % (ref 12–46)
MCH: 30 pg (ref 26.0–34.0)
MCHC: 34.9 g/dL (ref 30.0–36.0)
MCV: 86 fL (ref 78.0–100.0)
MONO ABS: 0.4 10*3/uL (ref 0.1–1.0)
MONOS PCT: 5 % (ref 3–12)
MPV: 9.2 fL (ref 8.6–12.4)
Neutro Abs: 3.9 10*3/uL (ref 1.7–7.7)
Neutrophils Relative %: 54 % (ref 43–77)
Platelets: 267 10*3/uL (ref 150–400)
RBC: 5.27 MIL/uL (ref 4.22–5.81)
RDW: 14.6 % (ref 11.5–15.5)
WBC: 7.2 10*3/uL (ref 4.0–10.5)

## 2015-04-22 LAB — BASIC METABOLIC PANEL WITH GFR
BUN: 14 mg/dL (ref 7–25)
CALCIUM: 9.4 mg/dL (ref 8.6–10.3)
CHLORIDE: 103 mmol/L (ref 98–110)
CO2: 27 mmol/L (ref 20–31)
CREATININE: 1.14 mg/dL (ref 0.70–1.25)
GFR, Est African American: 77 mL/min (ref >=60)
GFR, Est Non African American: 67 mL/min (ref >=60)
Glucose, Bld: 107 mg/dL — ABNORMAL HIGH (ref 65–99)
Potassium: 4.3 mmol/L (ref 3.5–5.3)
SODIUM: 140 mmol/L (ref 135–146)

## 2015-04-22 LAB — MAGNESIUM: MAGNESIUM: 1.9 mg/dL (ref 1.5–2.5)

## 2015-04-22 LAB — TSH: TSH: 1.447 u[IU]/mL (ref 0.350–4.500)

## 2015-04-23 LAB — HEMOGLOBIN A1C
Hgb A1c MFr Bld: 6.3 % — ABNORMAL HIGH (ref <5.7)
Mean Plasma Glucose: 134 mg/dL — ABNORMAL HIGH (ref <117)

## 2015-04-23 LAB — INSULIN, RANDOM: Insulin: 15.7 u[IU]/mL (ref 2.0–19.6)

## 2015-04-23 LAB — VITAMIN D 25 HYDROXY (VIT D DEFICIENCY, FRACTURES): Vit D, 25-Hydroxy: 49 ng/mL (ref 30–100)

## 2015-04-28 ENCOUNTER — Telehealth: Payer: Self-pay | Admitting: *Deleted

## 2015-04-28 NOTE — Telephone Encounter (Signed)
Pt aware of lab results 

## 2015-05-07 DIAGNOSIS — H04321 Acute dacryocystitis of right lacrimal passage: Secondary | ICD-10-CM | POA: Diagnosis not present

## 2015-06-05 ENCOUNTER — Other Ambulatory Visit: Payer: Self-pay | Admitting: *Deleted

## 2015-06-05 MED ORDER — VALSARTAN 320 MG PO TABS: 320.0000 mg | ORAL_TABLET | Freq: Every day | ORAL | Status: DC

## 2015-06-06 ENCOUNTER — Other Ambulatory Visit: Payer: Self-pay | Admitting: *Deleted

## 2015-06-06 MED ORDER — VALSARTAN 320 MG PO TABS: 320.0000 mg | ORAL_TABLET | Freq: Every day | ORAL | Status: DC

## 2015-07-23 ENCOUNTER — Ambulatory Visit (INDEPENDENT_AMBULATORY_CARE_PROVIDER_SITE_OTHER): Payer: Medicare Other | Admitting: Physician Assistant

## 2015-07-23 ENCOUNTER — Encounter: Payer: Self-pay | Admitting: Physician Assistant

## 2015-07-23 VITALS — BP 130/82 | HR 73 | Temp 97.7°F | Resp 16 | Ht 65.5 in | Wt 193.2 lb

## 2015-07-23 DIAGNOSIS — N5231 Erectile dysfunction following radical prostatectomy: Secondary | ICD-10-CM

## 2015-07-23 DIAGNOSIS — C61 Malignant neoplasm of prostate: Secondary | ICD-10-CM

## 2015-07-23 DIAGNOSIS — E782 Mixed hyperlipidemia: Secondary | ICD-10-CM

## 2015-07-23 DIAGNOSIS — N200 Calculus of kidney: Secondary | ICD-10-CM

## 2015-07-23 DIAGNOSIS — F411 Generalized anxiety disorder: Secondary | ICD-10-CM | POA: Diagnosis not present

## 2015-07-23 DIAGNOSIS — Z23 Encounter for immunization: Secondary | ICD-10-CM | POA: Diagnosis not present

## 2015-07-23 DIAGNOSIS — R6889 Other general symptoms and signs: Secondary | ICD-10-CM | POA: Diagnosis not present

## 2015-07-23 DIAGNOSIS — Z0000 Encounter for general adult medical examination without abnormal findings: Secondary | ICD-10-CM

## 2015-07-23 DIAGNOSIS — E1122 Type 2 diabetes mellitus with diabetic chronic kidney disease: Secondary | ICD-10-CM

## 2015-07-23 DIAGNOSIS — S68119D Complete traumatic metacarpophalangeal amputation of unspecified finger, subsequent encounter: Secondary | ICD-10-CM

## 2015-07-23 DIAGNOSIS — S68119A Complete traumatic metacarpophalangeal amputation of unspecified finger, initial encounter: Secondary | ICD-10-CM | POA: Insufficient documentation

## 2015-07-23 DIAGNOSIS — N182 Chronic kidney disease, stage 2 (mild): Secondary | ICD-10-CM | POA: Diagnosis not present

## 2015-07-23 DIAGNOSIS — Z0001 Encounter for general adult medical examination with abnormal findings: Secondary | ICD-10-CM

## 2015-07-23 DIAGNOSIS — I10 Essential (primary) hypertension: Secondary | ICD-10-CM | POA: Diagnosis not present

## 2015-07-23 DIAGNOSIS — E559 Vitamin D deficiency, unspecified: Secondary | ICD-10-CM

## 2015-07-23 DIAGNOSIS — E1129 Type 2 diabetes mellitus with other diabetic kidney complication: Secondary | ICD-10-CM | POA: Diagnosis not present

## 2015-07-23 DIAGNOSIS — Z79899 Other long term (current) drug therapy: Secondary | ICD-10-CM | POA: Diagnosis not present

## 2015-07-23 LAB — CBC WITH DIFFERENTIAL/PLATELET
BASOS ABS: 0 {cells}/uL (ref 0–200)
Basophils Relative: 0 %
EOS PCT: 3 %
Eosinophils Absolute: 216 cells/uL (ref 15–500)
HCT: 45.5 % (ref 38.5–50.0)
Hemoglobin: 15.4 g/dL (ref 13.2–17.1)
LYMPHS ABS: 2592 {cells}/uL (ref 850–3900)
LYMPHS PCT: 36 %
MCH: 29.9 pg (ref 27.0–33.0)
MCHC: 33.8 g/dL (ref 32.0–36.0)
MCV: 88.3 fL (ref 80.0–100.0)
MONOS PCT: 4 %
MPV: 9.2 fL (ref 7.5–12.5)
Monocytes Absolute: 288 cells/uL (ref 200–950)
Neutro Abs: 4104 cells/uL (ref 1500–7800)
Neutrophils Relative %: 57 %
PLATELETS: 255 10*3/uL (ref 140–400)
RBC: 5.15 MIL/uL (ref 4.20–5.80)
RDW: 14.3 % (ref 11.0–15.0)
WBC: 7.2 10*3/uL (ref 3.8–10.8)

## 2015-07-23 LAB — HEMOGLOBIN A1C
Hgb A1c MFr Bld: 6.3 % — ABNORMAL HIGH (ref <5.7)
MEAN PLASMA GLUCOSE: 134 mg/dL

## 2015-07-23 LAB — BASIC METABOLIC PANEL WITH GFR
BUN: 20 mg/dL (ref 7–25)
CALCIUM: 9.7 mg/dL (ref 8.6–10.3)
CO2: 25 mmol/L (ref 20–31)
CREATININE: 1.24 mg/dL (ref 0.70–1.25)
Chloride: 104 mmol/L (ref 98–110)
GFR, EST NON AFRICAN AMERICAN: 60 mL/min (ref >=60)
GFR, Est African American: 70 mL/min (ref >=60)
Glucose, Bld: 113 mg/dL — ABNORMAL HIGH (ref 65–99)
Potassium: 4.6 mmol/L (ref 3.5–5.3)
Sodium: 137 mmol/L (ref 135–146)

## 2015-07-23 LAB — LIPID PANEL
CHOL/HDL RATIO: 5.1 ratio — AB (ref <=5.0)
Cholesterol: 164 mg/dL (ref 125–200)
HDL: 32 mg/dL — ABNORMAL LOW (ref >=40)
LDL Cholesterol: 106 mg/dL (ref <130)
TRIGLYCERIDES: 132 mg/dL (ref <150)
VLDL: 26 mg/dL (ref <30)

## 2015-07-23 LAB — TSH: TSH: 0.73 m[IU]/L (ref 0.40–4.50)

## 2015-07-23 LAB — HEPATIC FUNCTION PANEL
ALT: 33 U/L (ref 9–46)
AST: 33 U/L (ref 10–35)
Albumin: 4.4 g/dL (ref 3.6–5.1)
Alkaline Phosphatase: 76 U/L (ref 40–115)
Bilirubin, Direct: 0.1 mg/dL (ref <=0.2)
Indirect Bilirubin: 0.3 mg/dL (ref 0.2–1.2)
Total Bilirubin: 0.4 mg/dL (ref 0.2–1.2)
Total Protein: 7.8 g/dL (ref 6.1–8.1)

## 2015-07-23 LAB — MAGNESIUM: MAGNESIUM: 2 mg/dL (ref 1.5–2.5)

## 2015-07-23 MED ORDER — BISOPROLOL-HYDROCHLOROTHIAZIDE 5-6.25 MG PO TABS: ORAL_TABLET | ORAL | Status: DC

## 2015-07-23 MED ORDER — VALSARTAN 320 MG PO TABS: 320.0000 mg | ORAL_TABLET | Freq: Every day | ORAL | Status: DC

## 2015-07-23 NOTE — Progress Notes (Signed)
Patient ID: Cody Zamora, male   DOB: October 06, 1950, 65 y.o.   MRN: [id number]  MEDICARE ANNUAL WELLNESS VISIT AND OV  Assessment:   1. Essential hypertension - Add Ziac 5- 1/2-1 pill daily - LONG DISCUSSION DIET- MONITOR AT HOME - DASH diet, exercise and monitor at home. Call if greater than 130/80.  - TSH  2. Mixed hyperlipidemia  - Lipid panel  3. Type 2 diabetes mellitus with other diabetic kidney complication  - Hemoglobin A1c - Insulin, fasting  4. Vitamin D deficiency  - Vit D  25 hydroxy (rtn osteoporosis monitoring)  5. Malignant neoplasm of prostate   6. Medication management  - CBC with Differential/Platelet - BASIC METABOLIC PANEL WITH GFR - Hepatic function panel - Magnesium  7. Malignant neoplasm of prostate ([place]) Continue follow up  8. Erectile dysfunction following radical prostatectomy Suggest referral  9. Encounter for Medicare annual wellness exam  10. Generalized anxiety disorder Continue meds PRN  11. Amputation of finger of left hand, subsequent encounter  12. Need for prophylactic vaccination against Streptococcus pneumoniae (pneumococcus) - Pneumococcal conjugate vaccine 13-valent IM   Plan:   During the course of the visit the patient was educated and counseled about appropriate screening and preventive services including:    Pneumococcal vaccine   Influenza vaccine  Td vaccine  Screening electrocardiogram  Bone densitometry screening  Colorectal cancer screening  Diabetes screening  Glaucoma screening  Nutrition counseling   Advanced directives: requested  Conditions/risks identified: BMI: Discussed weight loss, diet, and increase physical activity.  Increase physical activity: AHA recommends 150 minutes of physical activity a week.  Medications reviewed Diabetes is not at goal, ACE/ARB therapy: Yes. Urinary Incontinence is not an issue: discussed non pharmacology and pharmacology options.  Fall risk: low-  discussed PT, home fall assessment, medications.   Subjective:  Cody Zamora  presents for [facility] Annual Wellness Visit, last one was welcome to medicare 05/2014, and OV.    He has had elevated blood pressure since 1996. His blood pressure has not been controlled at home, has been 130-150/80-90, today their BP is BP: 130/82 mmHg He does workout, 3 x a week. He denies chest pain, shortness of breath, dizziness.  He is on cholesterol medication and denies myalgias. His cholesterol is at goal. The cholesterol last visit was:  Lab Results  Component Value Date   CHOL 158 04/22/2015   HDL 34* 04/22/2015   LDLCALC 105 04/22/2015   TRIG 93 04/22/2015   CHOLHDL 4.6 04/22/2015   He has had diabetes for 5 years since Aug 2011 with an A1c 6.5%, he is on MF and bASA, and ARB.  He has not been working on diet and exercise for diabetes, and denies foot ulcerations, hyperglycemia, hypoglycemia , paresthesia of the feet, polydipsia, polyuria and visual disturbances. Last A1C in the office was:  Lab Results  Component Value Date   HGBA1C 6.3* 04/22/2015   Patient is on Vitamin D supplement.   (Vit D was 11 in 2008)  Lab Results  Component Value Date   VD25OH 49 04/22/2015     BMI is Body mass index is 31.65 kg/(m^2)., he is working on diet and exercise. Wt Readings from Last 3 Encounters:  07/23/15 193 lb 3.2 oz (87.635 kg)  04/22/15 192 lb (87.091 kg)  01/16/15 189 lb (85.73 kg)    Names of Other Physician/Practitioners you currently use: 1. [facility] here for primary care 2. Dr Frederico Hamman, eye doctor, lFeb 2017  3. Dr Lara Mulch, dentist, last visit Jan 2016  Patient Care Team: [name], MD as PCP - General (Internal Medicine) Lowella Bandy, MD as Consulting Physician (Urology) Inda Castle, MD as Consulting Physician (Gastroenterology)  Medication Review:   Medication List       This list is accurate as of: 07/23/15  9:59 AM.  Always  use your most recent med list.               aspirin 81 MG tablet  Take 81 mg by mouth daily.     BENEFIBER DRINK MIX PO  Take 1 Package by mouth daily.     Fish Oil 1000 MG Caps  Take 2 capsules by mouth daily.     FLONASE 50 MCG/ACT nasal spray  Generic drug:  fluticasone  Place 1 spray into both nostrils daily as needed for allergies.     LORazepam 1 MG tablet  Commonly known as:  ATIVAN  Take 1 tablet (1 mg total) by mouth 2 (two) times daily as needed for anxiety.     metFORMIN 500 MG 24 hr tablet  Commonly known as:  GLUCOPHAGE-XR  TAKE 2 TABLETS TWICE A DAY WITH MEALS AS DIRECTED     multivitamin with minerals tablet  Take 1 tablet by mouth daily.     valsartan 320 MG tablet  Commonly known as:  DIOVAN  Take 1 tablet (320 mg total) by mouth daily.     VIGAMOX 0.5 % ophthalmic solution  Generic drug:  moxifloxacin     Vitamin D3 2000 units capsule  Take 6,000 Units by mouth daily.     ZETIA 10 MG tablet  Generic drug:  ezetimibe  TAKE 1 TABLET DAILY       Current Problems (verified) Patient Active Problem List   Diagnosis Date Noted  . Erectile dysfunction following radical prostatectomy 10/28/2014  . Medication management 10/06/2013  . 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 Essential hypertension 04/06/2013  . Mixed hyperlipidemia 04/06/2013  . Vitamin D deficiency 04/06/2013  . Nephrolithiasis 04/06/2013  . Malignant neoplasm of prostate ([place]) 04/06/2013   Screening Tests Immunization History  Administered Date(s) Administered  . DTaP 02/17/2002  . Influenza Whole 01/08/2013  . Influenza, High Dose Seasonal PF 02/26/2014  . Pneumococcal Polysaccharide-23 02/18/1996  . Tdap 06/29/2011  . Zoster 03/23/2011   Preventative care: Last colonoscopy: 10/28/2010 - Dr Deatra Ina - 35 yr f/u.  DEXA 2011 CXR 2013  Tdap 2013 Pneumonia 1997 Prevnar 13: DUE Influenza 2016 Shingles 2012  History reviewed: allergies, current medications,  past family history, past medical history, past social history, past surgical history and problem list  Risk Factors: Tobacco Social History  Substance Use Topics  . Smoking status: Never Smoker   . Smokeless tobacco: Never Used  . Alcohol Use: No   He does not smoke.  Patient is not a former smoker.  Alcohol Current alcohol use: rarely  Caffeine Current caffeine use: tea 1-2 /wk and caffeinated soft drinks 1-2 /wk  Exercise Current exercise: cardiovascular workout on exercise equipment  Nutrition/Diet Current diet: in general, a "healthy" diet    Cardiac risk factors: advanced age (older than 68 for men, 17 for women), dyslipidemia, hypertension, male gender, obesity (BMI >= 30 kg/m2) and sedentary lifestyle.  Depression Screen (Note: if answer to either of the following is "Yes", a more complete depression screening is indicated)   Q1: Over the past two weeks, have you felt down, depressed or hopeless? No  Q2: Over the past two weeks, have you felt little interest or pleasure in doing things? No  Have you lost interest or pleasure in daily life? No  Do you often feel hopeless? No  Do you cry easily over simple problems? No  Activities of Daily Living In your present state of health, do you have any difficulty performing the following activities?:  Driving? No Managing money?  No Feeding yourself? No Getting from bed to chair? No Climbing a flight of stairs? No Preparing food and eating?: No Bathing or showering? No Getting dressed: No Getting to the toilet? No Using the toilet:No Moving around from place to place: No In the past year have you fallen or had a near fall?:No  Vision Difficulties: No  Hearing Difficulties: No Do you often ask people to speak up or repeat themselves? No Do you experience ringing or noises in your ears? No Do you have difficulty understanding soft or whispered voices? No  Cognition  Do you feel that you have a problem with  memory?No  Do you often misplace items? No  Do you feel safe at home?  Yes  Advanced directives Does patient have a [place]? No - offered forms Does patient have a Living Will? No - offered forms  Family History  Problem Relation Age of Onset  . Heart disease Mother   . Diabetes Mother   . Arthritis Mother   . Alcohol abuse Father   . Hypertension Father    Allergies  Allergen Reactions  . Cialis [Tadalafil] Other (See Comments)    Hot flashes  . Fructose Nausea And Vomiting  . Morphine And Related Itching    Past Surgical History  Procedure Laterality Date  . Finger amputation  age 50    left Middle  . Robot assisted laparoscopic radical prostatectomy  Dec 2008  . Colonoscopy N/A last one 10-28-2010     due 10 yr f/u in 2022 - [facility]  . Percutaneous nephrolithotripsy Left 04-04-2009// second look 04-15-2009  . Ureterolithotomy  2002  . Shoulder open rotator cuff repair Right 05-11-2006  . Achilles tendon repair Right 1989  . Negative sleep study  2010  per pt  . Penile prosthesis implant N/A 10/28/2014    Procedure: IMPLANTATION 3 PIECE PENILE INFLATABLE PROTHESIS/COLOPLAST SCROTAL APPROACH;  Surgeon: Kathie Rhodes, MD;  Location: [facility];  Service: Urology;  Laterality: N/A;    Objective:     Blood pressure 130/82, pulse 73, temperature 97.7 F (36.5 C), temperature source Temporal, resp. rate 16, height 5' 5.5" (1.664 m), weight 193 lb 3.2 oz (87.635 kg), SpO2 98 %.  General Appearance:  Alert  WD/WN, male , in no apparent distress. Eyes: PERRLA, EOMs, conjunctiva no swelling or erythema, normal fundi and vessels. Sinuses: No frontal/maxillary tenderness ENT/Mouth: EACs patent / TMs  nl. Nares clear without erythema, swelling, mucoid exudates. Oral hygiene is good. No erythema, swelling, or exudate. Tongue normal, non-obstructing. Tonsils not swollen or erythematous. Hearing normal.  Neck: Supple, thyroid normal. No bruits,  nodes or JVD. Respiratory: Respiratory effort normal.  BS equal and clear bilateral without rales, rhonci, wheezing or stridor. Cardio: Heart sounds are normal with regular rate and rhythm and no murmurs, rubs or gallops. Peripheral pulses are normal and equal bilaterally without edema. No aortic or femoral bruits. Chest: symmetric with normal excursions and percussion.  Abdomen: Flat, soft, with nl bowel sounds. Nontender, no guarding, rebound, hernias, masses, or organomegaly.  Musculoskeletal: Full ROM all  peripheral extremities, joint stability, 5/5 strength, and normal gait. Skin: Warm and dry without rashes, lesions, cyanosis, clubbing or  ecchymosis.  Neuro: Cranial nerves intact, reflexes equal bilaterally. Normal muscle tone, no cerebellar symptoms. Sensation intact.  Pysch: Awake and oriented X 3 with normal affect, insight and judgment appropriate.   Cognitive Testing  Alert? Yes  Normal Appearance? Yes  Oriented to person? Yes  Place? Yes   Time? Yes  Recall of three objects?  Yes  Can perform simple calculations? Yes  Displays appropriate judgment? Yes  Can read the correct time from a watch/clock? Yes  Medicare Attestation I have personally reviewed: The patient's medical and social history Their use of alcohol, tobacco or illicit drugs Their current medications and supplements The patient's functional ability including ADLs,fall risks, home safety risks, cognitive, and hearing and visual impairment Diet and physical activities Evidence for depression or mood disorders  The patient's weight, height, BMI, and visual acuity have been recorded in the chart.  I have made referrals, counseling, and provided education to the patient based on review of the above and I have provided the patient with a written personalized care plan for preventive services.    Vicie Mutters, PA-C   07/23/2015

## 2015-07-23 NOTE — Patient Instructions (Addendum)
Monitor your blood pressure at home. Go to the ER if any CP, SOB, nausea, dizziness, severe HA, changes vision/speech  Goal BP:  For patients younger than 60: Goal BP < 140/90. For patients 60 and older: Goal BP < 150/90. For patients with diabetes: Goal BP < 140/90. Your most recent BP: BP: 130/82 mmHg   Take your medications faithfully as instructed. Maintain a healthy weight. Get at least 150 minutes of aerobic exercise per week. Minimize salt intake. Minimize alcohol intake  DASH Eating Plan DASH stands for "Dietary Approaches to Stop Hypertension." The DASH eating plan is a healthy eating plan that has been shown to reduce high blood pressure (hypertension). Additional health benefits may include reducing the risk of type 2 diabetes mellitus, heart disease, and stroke. The DASH eating plan may also help with weight loss. WHAT DO I NEED TO KNOW ABOUT THE DASH EATING PLAN? For the DASH eating plan, you will follow these general guidelines:  Choose foods with a percent daily value for sodium of less than 5% (as listed on the food label).  Use salt-free seasonings or herbs instead of table salt or sea salt.  Check with your health care provider or pharmacist before using salt substitutes.  Eat lower-sodium products, often labeled as "lower sodium" or "no salt added."  Eat fresh foods.  Eat more vegetables, fruits, and low-fat dairy products.  Choose whole grains. Look for the word "whole" as the first word in the ingredient list.  Choose fish and skinless chicken or [place] more often than red meat. Limit fish, poultry, and meat to 6 oz (170 g) each day.  Limit sweets, desserts, sugars, and sugary drinks.  Choose heart-healthy fats.  Limit cheese to 1 oz (28 g) per day.  Eat more home-cooked food and less restaurant, buffet, and fast food.  Limit fried foods.  Cook foods using methods other than frying.  Limit canned vegetables. If you do use them, rinse them well to  decrease the sodium.  When eating at a restaurant, ask that your food be prepared with less salt, or no salt if possible. WHAT FOODS CAN I EAT? Seek help from a dietitian for individual calorie needs. Grains Whole grain or whole wheat bread. Brown rice. Whole grain or whole wheat pasta. Quinoa, bulgur, and whole grain cereals. Low-sodium cereals. Corn or whole wheat flour tortillas. Whole grain cornbread. Whole grain crackers. Low-sodium crackers. Vegetables Fresh or frozen vegetables (raw, steamed, roasted, or grilled). Low-sodium or reduced-sodium tomato and vegetable juices. Low-sodium or reduced-sodium tomato sauce and paste. Low-sodium or reduced-sodium canned vegetables.  Fruits All fresh, canned (in natural juice), or frozen fruits. Meat and Other Protein Products Ground beef (85% or leaner), grass-fed beef, or beef trimmed of fat. Skinless chicken or [place]. Ground chicken or [place]. Pork trimmed of fat. All fish and seafood. Eggs. Dried beans, peas, or lentils. Unsalted nuts and seeds. Unsalted canned beans. Dairy Low-fat dairy products, such as skim or 1% milk, 2% or reduced-fat cheeses, low-fat ricotta or cottage cheese, or plain low-fat yogurt. Low-sodium or reduced-sodium cheeses. Fats and Oils Tub margarines without trans fats. Light or reduced-fat mayonnaise and salad dressings (reduced sodium). Avocado. Safflower, olive, or canola oils. Natural peanut or almond butter. Other Unsalted popcorn and pretzels. The items listed above may not be a complete list of recommended foods or beverages. Contact your dietitian for more options. WHAT FOODS ARE NOT RECOMMENDED? Grains White bread. White pasta. White rice. Refined cornbread. Bagels and croissants. Crackers that contain  trans fat. Vegetables Creamed or fried vegetables. Vegetables in a cheese sauce. Regular canned vegetables. Regular canned tomato sauce and paste. Regular tomato and vegetable juices. Fruits Dried fruits. Canned  fruit in light or heavy syrup. Fruit juice. Meat and Other Protein Products Fatty cuts of meat. Ribs, chicken wings, bacon, sausage, bologna, salami, chitterlings, fatback, hot dogs, bratwurst, and packaged luncheon meats. Salted nuts and seeds. Canned beans with salt. Dairy Whole or 2% milk, cream, half-and-half, and cream cheese. Whole-fat or sweetened yogurt. Full-fat cheeses or blue cheese. Nondairy creamers and whipped toppings. Processed cheese, cheese spreads, or cheese curds. Condiments Onion and garlic salt, seasoned salt, table salt, and sea salt. Canned and packaged gravies. Worcestershire sauce. Tartar sauce. Barbecue sauce. Teriyaki sauce. Soy sauce, including reduced sodium. Steak sauce. Fish sauce. Oyster sauce. Cocktail sauce. Horseradish. Ketchup and mustard. Meat flavorings and tenderizers. Bouillon cubes. Hot sauce. Tabasco sauce. Marinades. Taco seasonings. Relishes. Fats and Oils Butter, stick margarine, lard, shortening, ghee, and bacon fat. Coconut, palm kernel, or palm oils. Regular salad dressings. Other Pickles and olives. Salted popcorn and pretzels. The items listed above may not be a complete list of foods and beverages to avoid. Contact your dietitian for more information. WHERE CAN I FIND MORE INFORMATION? National Heart, Lung, and Blood Institute: travelstabloid.com Document Released: 03/25/2011 Document Revised: 08/20/2013 Document Reviewed: 02/07/2013 [facility] Patient Information 2015 [place], [place]. This information is not intended to replace advice given to you by your health care provider. Make sure you discuss any questions you have with your health care provider.  Here is some information to help you keep your heart healthy: Move it! - Aim for 30 mins of activity every day. Take it slowly at first. Talk to [place] before starting any new exercise program.   Lose it.  -Body Mass Index (BMI) can indicate if you need to lose  weight. A healthy range is 18.5-24.9. For a BMI calculator, go to [employer]  Waist Management -Excess abdominal fat is a risk factor for heart disease, diabetes, asthma, stroke and more. Ideal waist circumference is less than 35" for women and less than 40" for men.   Eat Right -focus on fruits, vegetables, whole grains, and meals you make yourself. Avoid foods with trans fat and high sugar/sodium content.   Snooze or Snore? - Loud snoring can be a sign of sleep apnea, a significant risk factor for high blood pressure, heart attach, stroke, and heart arrhythmias.  Are Aspirin and Supplements right for you? -Add ENTERIC COATED low dose 81 mg Aspirin daily OR can do every other day if you have easy bruising to protect your heart and head. As well as to reduce risk of Colon Cancer by 20 %, Skin Cancer by 26 % , Melanoma by 46% and Pancreatic cancer by 60%  Say "No to Stress -There may be little you can do about problems that cause stress. However, techniques such as long walks, meditation, and exercise can help you manage it.   Start Now! - Make changes one at a time and set reasonable goals to increase your likelihood of success.

## 2015-09-08 ENCOUNTER — Ambulatory Visit (INDEPENDENT_AMBULATORY_CARE_PROVIDER_SITE_OTHER): Payer: Medicare Other | Admitting: Internal Medicine

## 2015-09-08 ENCOUNTER — Encounter: Payer: Self-pay | Admitting: Internal Medicine

## 2015-09-08 VITALS — BP 134/70 | HR 68 | Temp 98.2°F | Resp 16 | Ht 65.5 in

## 2015-09-08 DIAGNOSIS — M79609 Pain in unspecified limb: Secondary | ICD-10-CM | POA: Diagnosis not present

## 2015-09-08 DIAGNOSIS — M79672 Pain in left foot: Secondary | ICD-10-CM

## 2015-09-08 MED ORDER — TRAMADOL HCL 50 MG PO TABS: 50.0000 mg | ORAL_TABLET | Freq: Four times a day (QID) | ORAL | Status: DC | PRN

## 2015-09-08 MED ORDER — PREDNISONE 20 MG PO TABS: ORAL_TABLET | ORAL | Status: DC

## 2015-09-08 NOTE — Progress Notes (Signed)
   Subjective:    Patient ID: Cody Zamora, male    DOB: [birth year], 65 y.o.   MRN: [id number]  Foot Pain Associated symptoms include arthralgias and joint swelling. Pertinent negatives include no chills, fatigue, fever, myalgias, neck pain or numbness.  Patietn presents to the office for evaluation of left foot pain which started 1 week ago.  He was in [place] and he developed pain in the left 1st MTP.  He reports that his foot has progressively gotten more swollen and red.  He reports that it is sensitive to having the sheet on it.  He is wearing a boot from an old injury.  He reports that the pain is severe.  He had no changes in diet while he was in [place].  No history of gout.  No family history of gout.  No remote injuries in the past.  He has been taking aleve with no real relief.  He has used topical gel.  No relief from ice or heat.  Pain aggravated by pressure and touching that.      Review of Systems  Constitutional: Negative for fever, chills and fatigue.  Musculoskeletal: Positive for joint swelling, arthralgias and gait problem. Negative for myalgias, back pain, neck pain and neck stiffness.  Skin: Positive for color change.  Neurological: Negative for dizziness and numbness.       Objective:   Physical Exam  Constitutional: He appears well-developed and well-nourished. No distress.  HENT:  Head: Normocephalic.  Mouth/Throat: Oropharynx is clear and moist. No oropharyngeal exudate.  Eyes: Conjunctivae are normal. No scleral icterus.  Neck: Normal range of motion. Neck supple. No JVD present. No thyromegaly present.  Cardiovascular: Normal rate, regular rhythm, normal heart sounds and intact distal pulses.  Exam reveals no gallop and no friction rub.   No murmur heard. Pulmonary/Chest: Effort normal and breath sounds normal. No respiratory distress. He has no wheezes. He has no rales. He exhibits no tenderness.  Abdominal: Soft. Bowel sounds are normal. He exhibits  no distension and no mass. There is no tenderness. There is no rebound and no guarding.  Musculoskeletal:       Left ankle: Normal. Achilles tendon normal.       Feet:  Lymphadenopathy:    He has no cervical adenopathy.  Skin: He is not diaphoretic.  Nursing note and vitals reviewed.   Filed Vitals:   09/08/15 1540  BP: 134/70  Pulse: 68  Temp: 98.2 F (36.8 C)  Resp: 16          Assessment & Plan:    1. Left foot pain -prednisone -tramadol - Uric acid

## 2015-09-09 LAB — URIC ACID: URIC ACID, SERUM: 9.1 mg/dL — AB (ref 4.0–7.8)

## 2015-09-17 ENCOUNTER — Other Ambulatory Visit: Payer: Self-pay | Admitting: Internal Medicine

## 2015-09-17 MED ORDER — ALLOPURINOL 300 MG PO TABS: 300.0000 mg | ORAL_TABLET | Freq: Every day | ORAL | Status: DC

## 2015-10-10 ENCOUNTER — Other Ambulatory Visit: Payer: Self-pay | Admitting: Internal Medicine

## 2015-10-24 ENCOUNTER — Other Ambulatory Visit: Payer: Self-pay | Admitting: Internal Medicine

## 2015-10-24 ENCOUNTER — Other Ambulatory Visit: Payer: Self-pay | Admitting: Physician Assistant

## 2015-10-24 DIAGNOSIS — I10 Essential (primary) hypertension: Secondary | ICD-10-CM

## 2015-10-24 MED ORDER — BISOPROLOL-HYDROCHLOROTHIAZIDE 5-6.25 MG PO TABS: ORAL_TABLET | ORAL | Status: DC

## 2015-10-24 MED ORDER — ALLOPURINOL 300 MG PO TABS: 300.0000 mg | ORAL_TABLET | Freq: Every day | ORAL | Status: DC

## 2015-10-30 ENCOUNTER — Encounter: Payer: Self-pay | Admitting: Internal Medicine

## 2015-12-24 ENCOUNTER — Encounter: Payer: Self-pay | Admitting: Internal Medicine

## 2015-12-24 ENCOUNTER — Ambulatory Visit (INDEPENDENT_AMBULATORY_CARE_PROVIDER_SITE_OTHER): Payer: Medicare Other | Admitting: Internal Medicine

## 2015-12-24 VITALS — BP 118/76 | HR 68 | Temp 97.2°F | Resp 16 | Ht 65.5 in | Wt 194.6 lb

## 2015-12-24 DIAGNOSIS — E1122 Type 2 diabetes mellitus with diabetic chronic kidney disease: Secondary | ICD-10-CM

## 2015-12-24 DIAGNOSIS — C61 Malignant neoplasm of prostate: Secondary | ICD-10-CM

## 2015-12-24 DIAGNOSIS — E782 Mixed hyperlipidemia: Secondary | ICD-10-CM | POA: Diagnosis not present

## 2015-12-24 DIAGNOSIS — I10 Essential (primary) hypertension: Secondary | ICD-10-CM | POA: Diagnosis not present

## 2015-12-24 DIAGNOSIS — Z23 Encounter for immunization: Secondary | ICD-10-CM | POA: Diagnosis not present

## 2015-12-24 DIAGNOSIS — M1009 Idiopathic gout, multiple sites: Secondary | ICD-10-CM | POA: Diagnosis not present

## 2015-12-24 DIAGNOSIS — Z79899 Other long term (current) drug therapy: Secondary | ICD-10-CM

## 2015-12-24 DIAGNOSIS — M109 Gout, unspecified: Secondary | ICD-10-CM

## 2015-12-24 DIAGNOSIS — Z136 Encounter for screening for cardiovascular disorders: Secondary | ICD-10-CM | POA: Diagnosis not present

## 2015-12-24 DIAGNOSIS — Z1212 Encounter for screening for malignant neoplasm of rectum: Secondary | ICD-10-CM

## 2015-12-24 DIAGNOSIS — E559 Vitamin D deficiency, unspecified: Secondary | ICD-10-CM | POA: Diagnosis not present

## 2015-12-24 DIAGNOSIS — N182 Chronic kidney disease, stage 2 (mild): Secondary | ICD-10-CM

## 2015-12-24 LAB — LIPID PANEL
Cholesterol: 154 mg/dL (ref 125–200)
HDL: 35 mg/dL — ABNORMAL LOW (ref >=40)
LDL Cholesterol: 89 mg/dL (ref <130)
Total CHOL/HDL Ratio: 4.4 Ratio (ref <=5.0)
Triglycerides: 150 mg/dL — ABNORMAL HIGH (ref <150)
VLDL: 30 mg/dL (ref <30)

## 2015-12-24 LAB — MAGNESIUM: MAGNESIUM: 1.9 mg/dL (ref 1.5–2.5)

## 2015-12-24 LAB — BASIC METABOLIC PANEL WITH GFR
BUN: 17 mg/dL (ref 7–25)
CALCIUM: 9.9 mg/dL (ref 8.6–10.3)
CO2: 27 mmol/L (ref 20–31)
CREATININE: 1.32 mg/dL — AB (ref 0.70–1.25)
Chloride: 103 mmol/L (ref 98–110)
GFR, Est African American: 64 mL/min (ref >=60)
GFR, Est Non African American: 55 mL/min — ABNORMAL LOW (ref >=60)
GLUCOSE: 111 mg/dL — AB (ref 65–99)
Potassium: 4.6 mmol/L (ref 3.5–5.3)
Sodium: 140 mmol/L (ref 135–146)

## 2015-12-24 LAB — CBC WITH DIFFERENTIAL/PLATELET
BASOS ABS: 0 {cells}/uL (ref 0–200)
Basophils Relative: 0 %
Eosinophils Absolute: 150 cells/uL (ref 15–500)
Eosinophils Relative: 2 %
HEMATOCRIT: 46.2 % (ref 38.5–50.0)
HEMOGLOBIN: 15.3 g/dL (ref 13.2–17.1)
LYMPHS ABS: 3225 {cells}/uL (ref 850–3900)
LYMPHS PCT: 43 %
MCH: 29.7 pg (ref 27.0–33.0)
MCHC: 33.1 g/dL (ref 32.0–36.0)
MCV: 89.7 fL (ref 80.0–100.0)
MONO ABS: 375 {cells}/uL (ref 200–950)
MPV: 9.5 fL (ref 7.5–12.5)
Monocytes Relative: 5 %
NEUTROS PCT: 50 %
Neutro Abs: 3750 cells/uL (ref 1500–7800)
Platelets: 265 10*3/uL (ref 140–400)
RBC: 5.15 MIL/uL (ref 4.20–5.80)
RDW: 15.2 % — AB (ref 11.0–15.0)
WBC: 7.5 10*3/uL (ref 3.8–10.8)

## 2015-12-24 LAB — URIC ACID: URIC ACID, SERUM: 5.7 mg/dL (ref 4.0–8.0)

## 2015-12-24 LAB — HEPATIC FUNCTION PANEL
ALBUMIN: 4.7 g/dL (ref 3.6–5.1)
ALT: 28 U/L (ref 9–46)
AST: 26 U/L (ref 10–35)
Alkaline Phosphatase: 69 U/L (ref 40–115)
BILIRUBIN INDIRECT: 0.4 mg/dL (ref 0.2–1.2)
Bilirubin, Direct: 0.1 mg/dL (ref <=0.2)
TOTAL PROTEIN: 8.1 g/dL (ref 6.1–8.1)
Total Bilirubin: 0.5 mg/dL (ref 0.2–1.2)

## 2015-12-24 NOTE — Progress Notes (Signed)
[facility]    [name], M.D.    Uvaldo Bristle. Silverio Lay, P.A.-C      Starlyn Skeans, P.A.-C [facility]                [address] [place] [id number] Telephone [phone] Telefax [phone] Comprehensive Evaluation & Examination     This very nice 65y.o.MBM presents for a comprehensive evaluation and management of multiple medical co-morbidities.  Patient has been followed for HTN, T2_NIDDM, Hyperlipidemia and Vitamin D Deficiency. Patient has hx/o Gout which is controlled on Allopurinol. Also note patient underwent robotic prostatectomy in 2008 for Prostate Ca.      HTN predates since 1996. Patient's BP has been controlled at home.Today's BP is 118/76. Patient denies any cardiac symptoms as chest pain, palpitations, shortness of breath, dizziness or ankle swelling.     Patient's hyperlipidemia is controlled with diet and Zetia (Statin Intolerant w/elev CPK). Patient denies myalgias or other medication SE's. 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 Last A1c was not at goal: Lab Results  Component Value Date   HGBA1C 6.3 (H) 07/23/2015       Finally, patient has history of Vitamin D Deficiency in 2008 of "11" and last vitamin D was not at goal: Lab Results  Component Value Date   VD25OH 49 04/22/2015   Current Outpatient Prescriptions on File Prior to Visit  Medication Sig  . allopurinol (ZYLOPRIM) 300 MG tablet Take 1 tablet (300 mg total) by mouth daily.  Marland Kitchen aspirin 81 MG tablet Take 81 mg  by mouth daily.    . bisoprolol-hydrochlorothiazide (ZIAC) 5-6.25 MG tablet 1/2-1 life at daily for BP  . Cholecalciferol (VITAMIN D3) 2000 UNITS capsule Take 6,000 Units by mouth daily.  . fluticasone (FLONASE) 50 MCG/ACT nasal spray Place 1 spray into both nostrils daily as needed for allergies.   Marland Kitchen LORazepam (ATIVAN) 1 MG tablet Take 1 tablet (1 mg total) by mouth 2 (two) times daily as needed for anxiety.  . metFORMIN (GLUCOPHAGE-XR) 500 MG 24 hr tablet TAKE 2 TABLETS TWICE A DAY WITH MEALS AS DIRECTED  . Multiple Vitamins-Minerals (MULTIVITAMIN WITH MINERALS) tablet Take 1 tablet by mouth daily.    . Omega-3 Fatty Acids (FISH OIL) 1000 MG CAPS Take 2 capsules by mouth daily.  . traMADol (ULTRAM) 50 MG tablet Take 1 tablet (50 mg total) by mouth every 6 (six) hours as needed for moderate pain or severe pain.  . valsartan (DIOVAN) 320 MG tablet Take 1 tablet (320 mg total) by mouth daily.  . Wheat Dextrin (BENEFIBER DRINK MIX PO) Take 1 Package by mouth daily.   Marland Kitchen ZETIA 10 MG tablet TAKE 1 TABLET DAILY   No current facility-administered medications on file prior to visit.    Allergies  Allergen Reactions  . Cialis [Tadalafil] Other (See Comments)    Hot flashes  . Fructose Nausea And Vomiting  . Morphine And Related Itching   Past Medical  History:  Diagnosis Date  . ED (erectile dysfunction) of organic origin   . History of colon polyps   . History of kidney stones   . History of positive PPD    06/ 2006--  CXR NORMAL  . History of prostate cancer    DEC 2008--  S/P  RADICAL PROSTATECTOMY  . Hyperlipidemia   . Hypertension   . Hypogonadism male   . Mixed hyperlipidemia   . Nocturia   . Sigmoid diverticulosis    MILD  . Type 2 diabetes mellitus [facility])    Health Maintenance  Topic Date Due  . INFLUENZA VACCINE  11/18/2015  . HEMOGLOBIN A1C  01/22/2016  . OPHTHALMOLOGY EXAM  05/26/2016  . FOOT EXAM  07/22/2016  . PNA vac Low Risk Adult (2 of 2 - PPSV23) 07/22/2016  .  COLONOSCOPY  10/27/2020  . TETANUS/TDAP  06/28/2021  . ZOSTAVAX  Completed  . Hepatitis C Screening  Completed   Immunization History  Administered Date(s) Administered  . DTaP 02/17/2002  . Influenza Whole 01/08/2013  . Influenza, High Dose Seasonal PF 02/26/2014  . Pneumococcal Conjugate-13 07/23/2015  . Pneumococcal Polysaccharide-23 02/18/1996  . Tdap 06/29/2011  . Zoster 03/23/2011   Past Surgical History:  Procedure Laterality Date  . ACHILLES TENDON REPAIR Right 1989  . COLONOSCOPY N/A last one 10-28-2010    due 10 yr f/u in 2022 - [facility]  . FINGER AMPUTATION  age 44   left [place]  . NEGATIVE SLEEP STUDY  2010  per pt  . PENILE PROSTHESIS IMPLANT N/A 10/28/2014   Procedure: IMPLANTATION 3 PIECE PENILE INFLATABLE PROTHESIS/COLOPLAST SCROTAL APPROACH;  Surgeon: Kathie Rhodes, MD;  Location: [facility];  Service: Urology;  Laterality: N/A;  . PERCUTANEOUS NEPHROLITHOTRIPSY Left 04-04-2009// second look 04-15-2009  . ROBOT ASSISTED LAPAROSCOPIC RADICAL PROSTATECTOMY  Dec 2008  . SHOULDER OPEN ROTATOR CUFF REPAIR Right 05-11-2006  . URETEROLITHOTOMY  2002   Family History  Problem Relation Age of Onset  . Heart disease Mother   . Diabetes Mother   . Arthritis Mother   . Alcohol abuse Father   . Hypertension Father    Social History   Social History  . Marital status: Married    Spouse name: N/A   Occupational History  . 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 Smoking status: Never Smoker  . Smokeless tobacco: Never Used  . Alcohol use No  . Drug use: No  . Sexual activity: Not on file    ROS Constitutional: Denies fever, chills, weight loss/gain, headaches, insomnia,  night sweats or change in appetite. Does c/o fatigue. Eyes: Denies redness, blurred vision, diplopia, discharge, itchy or watery eyes.  ENT: Denies discharge, congestion, post nasal drip, epistaxis, sore throat, earache, hearing loss, dental pain, Tinnitus,  Vertigo, Sinus pain or snoring.  Cardio: Denies chest pain, palpitations, irregular heartbeat, syncope, dyspnea, diaphoresis, orthopnea, PND, claudication or edema Respiratory: denies cough, dyspnea, DOE, pleurisy, hoarseness, laryngitis or wheezing.  Gastrointestinal: Denies dysphagia, heartburn, reflux, water brash, pain, cramps, nausea, vomiting, bloating, diarrhea, constipation, hematemesis, melena, hematochezia, jaundice or hemorrhoids Genitourinary: Denies dysuria, frequency, urgency, nocturia, hesitancy, discharge, hematuria or flank pain Musculoskeletal: Denies arthralgia, myalgia, stiffness, Jt. Swelling, pain, limp or strain/sprain. Denies Falls. Skin: Denies puritis, rash, hives, warts, acne, eczema or change in skin lesion Neuro: No weakness, tremor, incoordination, spasms, paresthesia or pain Psychiatric: Denies confusion, memory loss or sensory loss. Denies Depression. Endocrine: Denies change in weight, skin, hair change,  nocturia, and paresthesia, diabetic polys, visual blurring or hyper / hypo glycemic episodes.  Heme/Lymph: No excessive bleeding, bruising or enlarged lymph nodes.  Physical Exam  BP 118/76   Pulse 68   Temp 97.2 F (36.2 C)   Resp 16   Ht 5' 5.5" (1.664 m)   Wt 194 lb 9.6 oz (88.3 kg)   BMI 31.89 kg/m   General Appearance: Well nourished, in no apparent distress.  Eyes: PERRLA, EOMs, conjunctiva no swelling or erythema, normal fundi and vessels. Sinuses: No frontal/maxillary tenderness ENT/Mouth: EACs patent / TMs  nl. Nares clear without erythema, swelling, mucoid exudates. Oral hygiene is good. No erythema, swelling, or exudate. Tongue normal, non-obstructing. Tonsils not swollen or erythematous. Hearing normal.  Neck: Supple, thyroid normal. No bruits, nodes or JVD. Respiratory: Respiratory effort normal.  BS equal and clear bilateral without rales, rhonci, wheezing or stridor. Cardio: Heart sounds are normal with regular rate and rhythm and no  murmurs, rubs or gallops. Peripheral pulses are normal and equal bilaterally without edema. No aortic or femoral bruits. Chest: symmetric with normal excursions and percussion.  Abdomen: Soft, with Nl bowel sounds. Nontender, no guarding, rebound, hernias, masses, or organomegaly.  Lymphatics: Non tender without lymphadenopathy.  Genitourinary: No hernias.Testes nl DRE - prostate nl for age - smooth & firm w/o nodules. Musculoskeletal: Full ROM all peripheral extremities, joint stability, 5/5 strength, and normal gait. Skin: Warm and dry without rashes, lesions, cyanosis, clubbing or  ecchymosis.  Neuro: Cranial nerves intact, reflexes equal bilaterally. Normal muscle tone, no cerebellar symptoms. Sensation intact to touch, Vibratory & Monofilament testing to the toes bilaterally. Marland Kitchen  Pysch: Alert and oriented X 3 with normal affect, insight and judgment appropriate.   Assessment and Plan  1. Essential hypertension  - Microalbumin / creatinine urine ratio - EKG 12-Lead - [place], RETROPERITNL ABD,  LTD - TSH  2. Mixed hyperlipidemia  - EKG 12-Lead - [place], RETROPERITNL ABD,  LTD - Lipid panel - TSH  3. T2_ w/ CKD2, w/o use of Insulin ([name])  - Microalbumin / creatinine urine ratio - EKG 12-Lead - [place], RETROPERITNL ABD,  LTD - HM DIABETES FOOT EXAM - LOW EXTREMITY NEUR EXAM DOCUM - Hemoglobin A1c - Insulin, random  4. Vitamin D deficiency  - VITAMIN D 25 Hydroxy   5. Malignant neoplasm of prostate ([place])  - PSA  6. Screening for rectal cancer  - POC Hemoccult Bld/Stl  7. Screening for ischemic heart disease  - EKG 12-Lead  8. Medication management  - Urinalysis, Routine w reflex microscopic  - CBC with Differential/Platelet - BASIC METABOLIC PANEL WITH GFR - Hepatic function panel - Magnesium  9. 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 Need for prophylactic vaccination and  inoculation against influenza  - Flu vaccine HIGH DOSE PF (Fluzone High dose)       Continue prudent diet as discussed, weight control, BP monitoring, regular exercise, and medications as discussed.  Discussed med effects and SE's. Routine screening labs and tests as requested with regular follow-up as recommended. Over 40 minutes of exam, counseling, chart review and high complex critical decision making was performed

## 2015-12-24 NOTE — Patient Instructions (Signed)

## 2015-12-25 LAB — URINALYSIS, ROUTINE W REFLEX MICROSCOPIC
Bilirubin Urine: NEGATIVE
GLUCOSE, UA: NEGATIVE
HGB URINE DIPSTICK: NEGATIVE
KETONES UR: NEGATIVE
NITRITE: NEGATIVE
PH: 5.5 (ref 5.0–8.0)
PROTEIN: NEGATIVE
Specific Gravity, Urine: 1.019 (ref 1.001–1.035)

## 2015-12-25 LAB — PSA: PSA: <0.1 ng/mL (ref <=4.0)

## 2015-12-25 LAB — URINALYSIS, MICROSCOPIC ONLY
Bacteria, UA: NONE SEEN [HPF]
CRYSTALS: NONE SEEN [HPF]
Casts: NONE SEEN [LPF]
Yeast: NONE SEEN [HPF]

## 2015-12-25 LAB — VITAMIN D 25 HYDROXY (VIT D DEFICIENCY, FRACTURES): Vit D, 25-Hydroxy: 62 ng/mL (ref 30–100)

## 2015-12-25 LAB — INSULIN, RANDOM: INSULIN: 17.6 u[IU]/mL (ref 2.0–19.6)

## 2015-12-25 LAB — MICROALBUMIN / CREATININE URINE RATIO
Creatinine, Urine: 168 mg/dL (ref 20–370)
MICROALB/CREAT RATIO: 27 ug/mg{creat} (ref <30)
Microalb, Ur: 4.5 mg/dL

## 2015-12-25 LAB — HEMOGLOBIN A1C
HEMOGLOBIN A1C: 6 % — AB (ref <5.7)
MEAN PLASMA GLUCOSE: 126 mg/dL

## 2015-12-25 LAB — TSH: TSH: 1.72 mIU/L (ref 0.40–4.50)

## 2016-04-01 ENCOUNTER — Encounter: Payer: Self-pay | Admitting: Internal Medicine

## 2016-04-01 ENCOUNTER — Ambulatory Visit (INDEPENDENT_AMBULATORY_CARE_PROVIDER_SITE_OTHER): Payer: Medicare Other | Admitting: Internal Medicine

## 2016-04-01 VITALS — BP 122/80 | HR 50 | Temp 98.4°F | Resp 16 | Ht 65.5 in | Wt 195.0 lb

## 2016-04-01 DIAGNOSIS — Z79899 Other long term (current) drug therapy: Secondary | ICD-10-CM | POA: Diagnosis not present

## 2016-04-01 DIAGNOSIS — M25512 Pain in left shoulder: Secondary | ICD-10-CM

## 2016-04-01 DIAGNOSIS — E782 Mixed hyperlipidemia: Secondary | ICD-10-CM | POA: Diagnosis not present

## 2016-04-01 DIAGNOSIS — E559 Vitamin D deficiency, unspecified: Secondary | ICD-10-CM | POA: Diagnosis not present

## 2016-04-01 DIAGNOSIS — N182 Chronic kidney disease, stage 2 (mild): Secondary | ICD-10-CM

## 2016-04-01 DIAGNOSIS — E1122 Type 2 diabetes mellitus with diabetic chronic kidney disease: Secondary | ICD-10-CM | POA: Diagnosis not present

## 2016-04-01 DIAGNOSIS — I10 Essential (primary) hypertension: Secondary | ICD-10-CM | POA: Diagnosis not present

## 2016-04-01 DIAGNOSIS — E349 Endocrine disorder, unspecified: Secondary | ICD-10-CM | POA: Diagnosis not present

## 2016-04-01 LAB — BASIC METABOLIC PANEL WITH GFR
BUN: 16 mg/dL (ref 7–25)
CALCIUM: 9.2 mg/dL (ref 8.6–10.3)
CO2: 28 mmol/L (ref 20–31)
CREATININE: 1.22 mg/dL (ref 0.70–1.25)
Chloride: 104 mmol/L (ref 98–110)
GFR, EST AFRICAN AMERICAN: 70 mL/min (ref >=60)
GFR, EST NON AFRICAN AMERICAN: 61 mL/min (ref >=60)
Glucose, Bld: 107 mg/dL — ABNORMAL HIGH (ref 65–99)
Potassium: 4.3 mmol/L (ref 3.5–5.3)
SODIUM: 139 mmol/L (ref 135–146)

## 2016-04-01 LAB — CBC WITH DIFFERENTIAL/PLATELET
BASOS ABS: 0 {cells}/uL (ref 0–200)
Basophils Relative: 0 %
EOS PCT: 3 %
Eosinophils Absolute: 225 cells/uL (ref 15–500)
HCT: 44 % (ref 38.5–50.0)
Hemoglobin: 14.6 g/dL (ref 13.2–17.1)
LYMPHS ABS: 3150 {cells}/uL (ref 850–3900)
Lymphocytes Relative: 42 %
MCH: 30 pg (ref 27.0–33.0)
MCHC: 33.2 g/dL (ref 32.0–36.0)
MCV: 90.3 fL (ref 80.0–100.0)
MONOS PCT: 6 %
MPV: 9.1 fL (ref 7.5–12.5)
Monocytes Absolute: 450 cells/uL (ref 200–950)
NEUTROS ABS: 3675 {cells}/uL (ref 1500–7800)
NEUTROS PCT: 49 %
PLATELETS: 238 10*3/uL (ref 140–400)
RBC: 4.87 MIL/uL (ref 4.20–5.80)
RDW: 14.6 % (ref 11.0–15.0)
WBC: 7.5 10*3/uL (ref 3.8–10.8)

## 2016-04-01 LAB — LIPID PANEL
CHOL/HDL RATIO: 6.8 ratio — AB (ref <5.0)
CHOLESTEROL: 191 mg/dL (ref <200)
HDL: 28 mg/dL — AB (ref >40)
LDL Cholesterol: 120 mg/dL — ABNORMAL HIGH (ref <100)
TRIGLYCERIDES: 216 mg/dL — AB (ref <150)
VLDL: 43 mg/dL — AB (ref <30)

## 2016-04-01 LAB — HEMOGLOBIN A1C
Hgb A1c MFr Bld: 6 % — ABNORMAL HIGH (ref <5.7)
Mean Plasma Glucose: 126 mg/dL

## 2016-04-01 LAB — HEPATIC FUNCTION PANEL
ALT: 25 U/L (ref 9–46)
AST: 25 U/L (ref 10–35)
Albumin: 4.3 g/dL (ref 3.6–5.1)
Alkaline Phosphatase: 62 U/L (ref 40–115)
BILIRUBIN DIRECT: 0.1 mg/dL (ref <=0.2)
BILIRUBIN INDIRECT: 0.4 mg/dL (ref 0.2–1.2)
BILIRUBIN TOTAL: 0.5 mg/dL (ref 0.2–1.2)
Total Protein: 7.4 g/dL (ref 6.1–8.1)

## 2016-04-01 LAB — TSH: TSH: 1.23 mIU/L (ref 0.40–4.50)

## 2016-04-01 LAB — TESTOSTERONE: Testosterone: 356 ng/dL (ref 250–827)

## 2016-04-01 MED ORDER — TRAMADOL HCL 50 MG PO TABS: 50.0000 mg | ORAL_TABLET | Freq: Four times a day (QID) | ORAL | 0 refills | Status: DC | PRN

## 2016-04-01 MED ORDER — CYCLOBENZAPRINE HCL 10 MG PO TABS
10.0000 mg | ORAL_TABLET | Freq: Three times a day (TID) | ORAL | 0 refills | Status: DC | PRN
Start: 2016-04-01 — End: 2017-03-21

## 2016-04-01 NOTE — Progress Notes (Signed)
Assessment and Plan:  Hypertension:  -Continue medication,  -monitor blood pressure at home.  -Continue DASH diet.   -Reminder to go to the ER if any CP, SOB, nausea, dizziness, severe HA, changes vision/speech, left arm numbness and tingling, and jaw pain.  Cholesterol: -Continue diet and exercise.  -Check cholesterol.   Pre-diabetes: -Continue diet and exercise.  -Check A1C  Vitamin D Def: -check level -continue medications.   Left Shoulder pain -likely rotator cuff tendonitis -suspect that he will improve with IA injection and PT -do not feel that this is complete rotator cuff tear.    Continue diet and meds as discussed. Further disposition pending results of labs.  HPI 65 y.o. male  presents for 3 month follow up with hypertension, hyperlipidemia, prediabetes and vitamin D.   His blood pressure has been controlled at home, today their BP is BP: 122/80.   He does workout. He denies chest pain, shortness of breath, dizziness.  He reports that he has been exercising with a partner and that has been helping him a lot.     He is on cholesterol medication and denies myalgias. His cholesterol is at goal. The cholesterol last visit was:   Lab Results  Component Value Date   CHOL 154 12/24/2015   HDL 35 (L) 12/24/2015   LDLCALC 89 12/24/2015   TRIG 150 (H) 12/24/2015   CHOLHDL 4.4 12/24/2015     He has been working on diet and exercise for prediabetes, and denies foot ulcerations, hyperglycemia, hypoglycemia , increased appetite, nausea, paresthesia of the feet, polydipsia, polyuria, visual disturbances, vomiting and weight loss. Last A1C in the office was:  Lab Results  Component Value Date   HGBA1C 6.0 (H) 12/24/2015    Patient is on Vitamin D supplement.  Lab Results  Component Value Date   VD25OH 75 12/24/2015     He has been having left shoulder pain which is throbbing and aching for months. No tingling numbness or swelling.  He does note some weakness.  He is  taking tylenol for it occasionally.    Current Medications:  Current Outpatient Prescriptions on File Prior to Visit  Medication Sig Dispense Refill  . allopurinol (ZYLOPRIM) 300 MG tablet Take 1 tablet (300 mg total) by mouth daily. 90 tablet 2  . aspirin 81 MG tablet Take 81 mg by mouth daily.      . bisoprolol-hydrochlorothiazide (ZIAC) 5-6.25 MG tablet 1/2-1 life at daily for BP 90 tablet 3  . Cholecalciferol (VITAMIN D3) 2000 UNITS capsule Take 6,000 Units by mouth daily.    . fluticasone (FLONASE) 50 MCG/ACT nasal spray Place 1 spray into both nostrils daily as needed for allergies.     Marland Kitchen LORazepam (ATIVAN) 1 MG tablet Take 1 tablet (1 mg total) by mouth 2 (two) times daily as needed for anxiety. 60 tablet 0  . metFORMIN (GLUCOPHAGE-XR) 500 MG 24 hr tablet TAKE 2 TABLETS TWICE A DAY WITH MEALS AS DIRECTED 360 tablet 3  . Multiple Vitamins-Minerals (MULTIVITAMIN WITH MINERALS) tablet Take 1 tablet by mouth daily.      . Omega-3 Fatty Acids (FISH OIL) 1000 MG CAPS Take 2 capsules by mouth daily.    . traMADol (ULTRAM) 50 MG tablet Take 1 tablet (50 mg total) by mouth every 6 (six) hours as needed for moderate pain or severe pain. 60 tablet 0  . valsartan (DIOVAN) 320 MG tablet Take 1 tablet (320 mg total) by mouth daily. 90 tablet 3  . Wheat Dextrin (BENEFIBER DRINK  MIX PO) Take 1 Package by mouth daily.     Marland Kitchen ZETIA 10 MG tablet TAKE 1 TABLET DAILY 90 tablet 3   No current facility-administered medications on file prior to visit.     Medical History:  Past Medical History:  Diagnosis Date  . ED (erectile dysfunction) of organic origin   . History of colon polyps   . History of kidney stones   . History of positive PPD    06/ 2006--  CXR NORMAL  . History of prostate cancer    DEC 2008--  S/P  RADICAL PROSTATECTOMY  . Hyperlipidemia   . Hypertension   . Hypogonadism male   . Mixed hyperlipidemia   . Nocturia   . Sigmoid diverticulosis    MILD  . Type 2 diabetes mellitus  ([name])     Allergies:  Allergies  Allergen Reactions  . Cialis [Tadalafil] Other (See Comments)    Hot flashes  . Fructose Nausea And Vomiting  . Morphine And Related Itching     Review of Systems:  Review of Systems  Constitutional: Negative for chills, fever and malaise/fatigue.  HENT: Negative for congestion, ear pain and sore throat.   Eyes: Negative.   Respiratory: Negative for cough, shortness of breath and wheezing.   Cardiovascular: Negative for chest pain, palpitations and leg swelling.  Gastrointestinal: Negative for abdominal pain, blood in stool, constipation, diarrhea, heartburn and melena.  Genitourinary: Negative.   Musculoskeletal: Positive for joint pain and myalgias.  Skin: Negative.   Neurological: Negative for dizziness, sensory change, loss of consciousness and headaches.  Psychiatric/Behavioral: Negative for depression. The patient is not nervous/anxious and does not have insomnia.     Family history- Review and unchanged  Social history- Review and unchanged  Physical Exam: BP 122/80   Pulse (!) 50   Temp 98.4 F (36.9 C) (Temporal)   Resp 16   Ht 5' 5.5" (1.664 m)   Wt 195 lb (88.5 kg)   BMI 31.96 kg/m  Wt Readings from Last 3 Encounters:  04/01/16 195 lb (88.5 kg)  12/24/15 194 lb 9.6 oz (88.3 kg)  07/23/15 193 lb 3.2 oz (87.6 kg)    General Appearance: Well nourished well developed, in no apparent distress. Eyes: PERRLA, EOMs, conjunctiva no swelling or erythema ENT/Mouth: Ear canals normal without obstruction, swelling, erythma, discharge.  TMs normal bilaterally.  Oropharynx moist, clear, without exudate, or postoropharyngeal swelling. Neck: Supple, thyroid normal,no cervical adenopathy  Respiratory: Respiratory effort normal, Breath sounds clear A&P without rhonchi, wheeze, or rale.  No retractions, no accessory usage. Cardio: RRR with no MRGs. Brisk peripheral pulses without edema.  Abdomen: Soft, + BS,  Non tender, no guarding,  rebound, hernias, masses. Musculoskeletal: Full ROM, 5/5 strength, Normal gait.  Left shoulder with positive speeds test, positive empty can, pain with external rotation actively.  Skin: Warm, dry without rashes, lesions, ecchymosis.  Neuro: Awake and oriented X 3, Cranial nerves intact. Normal muscle tone, no cerebellar symptoms. Psych: Normal affect, Insight and Judgment appropriate.    Starlyn Skeans, PA-C 9:06 AM [facility]

## 2016-04-26 ENCOUNTER — Ambulatory Visit (INDEPENDENT_AMBULATORY_CARE_PROVIDER_SITE_OTHER): Payer: Self-pay | Admitting: Orthopaedic Surgery

## 2016-04-29 ENCOUNTER — Ambulatory Visit (INDEPENDENT_AMBULATORY_CARE_PROVIDER_SITE_OTHER): Payer: Medicare Other

## 2016-04-29 ENCOUNTER — Ambulatory Visit (INDEPENDENT_AMBULATORY_CARE_PROVIDER_SITE_OTHER): Payer: Medicare Other | Admitting: Orthopaedic Surgery

## 2016-04-29 ENCOUNTER — Encounter (INDEPENDENT_AMBULATORY_CARE_PROVIDER_SITE_OTHER): Payer: Self-pay | Admitting: Orthopaedic Surgery

## 2016-04-29 VITALS — BP 145/82 | HR 74 | Ht 65.5 in | Wt 195.0 lb

## 2016-04-29 DIAGNOSIS — M25512 Pain in left shoulder: Secondary | ICD-10-CM

## 2016-04-29 DIAGNOSIS — G8929 Other chronic pain: Secondary | ICD-10-CM

## 2016-04-29 MED ORDER — LIDOCAINE HCL 1 % IJ SOLN
2.0000 mL | INTRAMUSCULAR | Status: AC | PRN
  Administered 2016-04-29: 2 mL

## 2016-04-29 MED ORDER — METHYLPREDNISOLONE ACETATE 40 MG/ML IJ SUSP
80.0000 mg | INTRAMUSCULAR | Status: AC | PRN
  Administered 2016-04-29: 80 mg

## 2016-04-29 MED ORDER — BUPIVACAINE HCL 0.5 % IJ SOLN
2.0000 mL | INTRAMUSCULAR | Status: AC | PRN
  Administered 2016-04-29: 2 mL via INTRA_ARTICULAR

## 2016-04-29 NOTE — Progress Notes (Signed)
Office Visit Note   Patient: Cody Zamora           Date of Birth: 23-Oct-1950           MRN: [id number] Visit Date: 04/29/2016              Requested by: Cody Skeans, PA-C [address] [place]  [id number] PCP: Cody Richards, MD   Assessment & Plan: Visit Diagnoses: Left shoulder impingement. Possible rotator cuff tear  Plan: Cortisone injection subacromial region left shoulder, follow-up in 2-3 weeks if no improvement. Consider MRI scan at that point.  Follow-Up Instructions: No Follow-up on file.   Orders:  No orders of the defined types were placed in this encounter.  No orders of the defined types were placed in this encounter.     Procedures: Large Joint Inj Date/Time: 04/29/2016 12:08 PM Performed by: Cody Zamora Authorized by: Cody Zamora   Consent Given by:  Patient Timeout: prior to procedure the correct patient, procedure, and site was verified   Indications:  Pain Location:  Shoulder Site:  L subacromial bursa Prep: patient was prepped and draped in usual sterile fashion   Needle Size:  25 G Needle Length:  1.5 inches Approach:  Lateral Ultrasound Guidance: No   Fluoroscopic Guidance: No   Arthrogram: No   Medications:  80 mg methylPREDNISolone acetate 40 MG/ML; 2 mL lidocaine 1 %; 2 mL bupivacaine 0.5 % Aspiration Attempted: No   Patient tolerance:  Patient tolerated the procedure well with no immediate complications     Clinical Data: No additional findings.   Subjective: No chief complaint on file.   Pt having Left shoulder pain, 6 weeks, denies injury. Denies any cervical pain.  Went to PCP Cody Zamora, referred to [place].    Cody Zamora relates insidious onset of left shoulder pain approximately 6 weeks ago. He denies any history of injury or trauma. Does work out at [employer] but doesn't remember a specific episode where he was experiencing shoulder pain. He denies any ecchymosis or  erythema. Denies any neck pain.  Review of Systems   Objective: Vital Signs: There were no vitals taken for this visit.  Physical Exam  Ortho Exam left shoulder exam demonstrates positive impingement on the extremes of internal/external rotation. Positive empty can test. Painful overhead arc of motion but is able to fully flex and internally and externally rotate the arm. Biceps intact. Good grip good release. Painless range of motion of cervical spine without referred pain. No obvious atrophy.  No specialty comments available.  Imaging: No results found.   PMFS History: Patient Active Problem List   Diagnosis Date Noted  . Encounter for Medicare annual wellness exam 07/23/2015  . Generalized anxiety disorder 07/23/2015  . 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 Erectile dysfunction following radical prostatectomy 10/28/2014  . Medication management 10/06/2013  . 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 Essential hypertension 04/06/2013  . Mixed hyperlipidemia 04/06/2013  . Vitamin D deficiency 04/06/2013  . Nephrolithiasis 04/06/2013  . Malignant neoplasm of prostate ([place]) 04/06/2013   Past Medical History:  Diagnosis Date  . ED (erectile dysfunction) of organic origin   . History of colon polyps   . History of kidney stones   . History of positive PPD    06/ 2006--  CXR NORMAL  . History of prostate cancer    DEC 2008--  S/P  RADICAL PROSTATECTOMY  . Hyperlipidemia   .  Hypertension   . Hypogonadism male   . Mixed hyperlipidemia   . Nocturia   . Sigmoid diverticulosis    MILD  . Type 2 diabetes mellitus ([name])     Family History  Problem Relation Age of Onset  . Heart disease Mother   . Diabetes Mother   . Arthritis Mother   . Alcohol abuse Father   . Hypertension Father     Past Surgical History:  Procedure Laterality Date  . ACHILLES TENDON REPAIR Right 1989  . COLONOSCOPY N/A last one 10-28-2010    due 10 yr f/u in 2022 - Cody Zamora  .  FINGER AMPUTATION  age 68   left Middle  . NEGATIVE SLEEP STUDY  2010  per pt  . PENILE PROSTHESIS IMPLANT N/A 10/28/2014   Procedure: IMPLANTATION 3 PIECE PENILE INFLATABLE PROTHESIS/COLOPLAST SCROTAL APPROACH;  Surgeon: Cody Rhodes, MD;  Location: [facility];  Service: Urology;  Laterality: N/A;  . PERCUTANEOUS NEPHROLITHOTRIPSY Left 04-04-2009// second look 04-15-2009  . ROBOT ASSISTED LAPAROSCOPIC RADICAL PROSTATECTOMY  Dec 2008  . SHOULDER OPEN ROTATOR CUFF REPAIR Right 05-11-2006  . URETEROLITHOTOMY  2002   Social History   Occupational History  . Not on file.   Social History Main Topics  . Smoking status: Never Smoker  . Smokeless tobacco: Never Used  . Alcohol use No  . Drug use: No  . Sexual activity: Not on file

## 2016-07-09 ENCOUNTER — Encounter: Payer: Self-pay | Admitting: Internal Medicine

## 2016-07-09 ENCOUNTER — Ambulatory Visit (INDEPENDENT_AMBULATORY_CARE_PROVIDER_SITE_OTHER): Payer: Medicare Other | Admitting: Internal Medicine

## 2016-07-09 VITALS — BP 116/76 | HR 56 | Temp 97.3°F | Resp 16 | Ht 65.5 in | Wt 195.2 lb

## 2016-07-09 DIAGNOSIS — Z79899 Other long term (current) drug therapy: Secondary | ICD-10-CM | POA: Diagnosis not present

## 2016-07-09 DIAGNOSIS — E1122 Type 2 diabetes mellitus with diabetic chronic kidney disease: Secondary | ICD-10-CM

## 2016-07-09 DIAGNOSIS — E559 Vitamin D deficiency, unspecified: Secondary | ICD-10-CM | POA: Diagnosis not present

## 2016-07-09 DIAGNOSIS — E782 Mixed hyperlipidemia: Secondary | ICD-10-CM

## 2016-07-09 DIAGNOSIS — N182 Chronic kidney disease, stage 2 (mild): Secondary | ICD-10-CM

## 2016-07-09 DIAGNOSIS — I10 Essential (primary) hypertension: Secondary | ICD-10-CM

## 2016-07-09 LAB — CBC WITH DIFFERENTIAL/PLATELET
BASOS PCT: 0 %
Basophils Absolute: 0 cells/uL (ref 0–200)
Eosinophils Absolute: 279 cells/uL (ref 15–500)
Eosinophils Relative: 3 %
HEMATOCRIT: 44.3 % (ref 38.5–50.0)
Hemoglobin: 14.9 g/dL (ref 13.2–17.1)
LYMPHS ABS: 4371 {cells}/uL — AB (ref 850–3900)
LYMPHS PCT: 47 %
MCH: 30.2 pg (ref 27.0–33.0)
MCHC: 33.6 g/dL (ref 32.0–36.0)
MCV: 89.9 fL (ref 80.0–100.0)
MONO ABS: 558 {cells}/uL (ref 200–950)
MPV: 9.7 fL (ref 7.5–12.5)
Monocytes Relative: 6 %
Neutro Abs: 4092 cells/uL (ref 1500–7800)
Neutrophils Relative %: 44 %
Platelets: 265 10*3/uL (ref 140–400)
RBC: 4.93 MIL/uL (ref 4.20–5.80)
RDW: 14.6 % (ref 11.0–15.0)
WBC: 9.3 10*3/uL (ref 3.8–10.8)

## 2016-07-09 LAB — HEPATIC FUNCTION PANEL
ALK PHOS: 74 U/L (ref 40–115)
ALT: 25 U/L (ref 9–46)
AST: 22 U/L (ref 10–35)
Albumin: 4.3 g/dL (ref 3.6–5.1)
Bilirubin, Direct: 0.1 mg/dL (ref <=0.2)
Indirect Bilirubin: 0.2 mg/dL (ref 0.2–1.2)
Total Bilirubin: 0.3 mg/dL (ref 0.2–1.2)
Total Protein: 7.2 g/dL (ref 6.1–8.1)

## 2016-07-09 LAB — LIPID PANEL
CHOL/HDL RATIO: 4.6 ratio (ref <5.0)
Cholesterol: 129 mg/dL (ref <200)
HDL: 28 mg/dL — AB (ref >40)
LDL CALC: 54 mg/dL (ref <100)
Triglycerides: 235 mg/dL — ABNORMAL HIGH (ref <150)
VLDL: 47 mg/dL — AB (ref <30)

## 2016-07-09 LAB — BASIC METABOLIC PANEL WITH GFR
BUN: 17 mg/dL (ref 7–25)
CHLORIDE: 106 mmol/L (ref 98–110)
CO2: 26 mmol/L (ref 20–31)
Calcium: 9.4 mg/dL (ref 8.6–10.3)
Creat: 1.27 mg/dL — ABNORMAL HIGH (ref 0.70–1.25)
GFR, Est African American: 67 mL/min (ref >=60)
GFR, Est Non African American: 58 mL/min — ABNORMAL LOW (ref >=60)
Glucose, Bld: 99 mg/dL (ref 65–99)
POTASSIUM: 4.1 mmol/L (ref 3.5–5.3)
SODIUM: 140 mmol/L (ref 135–146)

## 2016-07-09 LAB — TSH: TSH: 1.25 m[IU]/L (ref 0.40–4.50)

## 2016-07-09 NOTE — Patient Instructions (Signed)

## 2016-07-09 NOTE — Progress Notes (Signed)
This very nice 66 y.o. MBM presents for 6 month follow up with Hypertension, Hyperlipidemia, T2_NIDDM and Vitamin D Deficiency. He also has hx/o Gout controlled w/meds.     Patient is treated for HTN (1996) & BP has been controlled at home. Today's BP is at goal -  116/76. Patient has had no complaints of any cardiac type chest pain, palpitations, dyspnea/orthopnea/PND, dizziness, claudication, or dependent edema.     Hyperlipidemia is not controlled with diet & Zetia (Statin intolerant). Patient denies myalgias or other med SE's. Last Lipids were not at goal: Lab Results  Component Value Date   CHOL 191 04/01/2016   HDL 28 (L) 04/01/2016   LDLCALC 120 (H) 04/01/2016   TRIG 216 (H) 04/01/2016   CHOLHDL 6.8 (H) 04/01/2016      Also, the patient has history of Morbid Obesity (BMI 32) and T2_NIDDM (2011) with CKD2 (GFR 77)treated inially with diet and Metformin started in 2013. He has had no symptoms of reactive hypoglycemia, diabetic polys, paresthesias or visual blurring.  He alleges FBG's have been <100 mg%. Last A1c was not at goal: Lab Results  Component Value Date   HGBA1C 6.0 (H) 04/01/2016      Further, the patient also has history of Vitamin D Deficiency ("11" in 2008) and supplements vitamin D without any suspected side-effects. Last vitamin D was at goal:  Lab Results  Component Value Date   VD25OH 62 12/24/2015   Current Outpatient Prescriptions on File Prior to Visit  Medication Sig  . allopurinol (ZYLOPRIM) 300 MG tablet Take 1 tablet (300 mg total) by mouth daily.  Marland Kitchen aspirin 81 MG tablet Take 81 mg by mouth daily.    . bisoprolol-hydrochlorothiazide (ZIAC) 5-6.25 MG tablet 1/2-1 life at daily for BP  . Cholecalciferol (VITAMIN D3) 2000 UNITS capsule Take 6,000 Units by mouth daily.  . cyclobenzaprine (FLEXERIL) 10 MG tablet Take 1 tablet (10 mg total) by mouth 3 (three) times daily as needed for muscle spasms.  . fluticasone (FLONASE) 50 MCG/ACT nasal spray Place 1  spray into both nostrils daily as needed for allergies.   Marland Kitchen LORazepam (ATIVAN) 1 MG tablet Take 1 tablet (1 mg total) by mouth 2 (two) times daily as needed for anxiety.  . metFORMIN (GLUCOPHAGE-XR) 500 MG 24 hr tablet TAKE 2 TABLETS TWICE A DAY WITH MEALS AS DIRECTED  . Multiple Vitamins-Minerals (MULTIVITAMIN WITH MINERALS) tablet Take 1 tablet by mouth daily.    . Omega-3 Fatty Acids (FISH OIL) 1000 MG CAPS Take 2 capsules by mouth daily.  . traMADol (ULTRAM) 50 MG tablet Take 1 tablet (50 mg total) by mouth every 6 (six) hours as needed for moderate pain or severe pain.  . valsartan (DIOVAN) 320 MG tablet Take 1 tablet (320 mg total) by mouth daily.  . Wheat Dextrin (BENEFIBER DRINK MIX PO) Take 1 Package by mouth daily.   Marland Kitchen ZETIA 10 MG tablet TAKE 1 TABLET DAILY   No current facility-administered medications on file prior to visit.    Allergies  Allergen Reactions  . Cialis [Tadalafil] Other (See Comments)    Hot flashes  . Fructose Nausea And Vomiting  . Morphine And Related Itching   PMHx:   Past Medical History:  Diagnosis Date  . ED (erectile dysfunction) of organic origin   . History of colon polyps   . History of kidney stones   . History of positive PPD    06/ 2006--  CXR NORMAL  . History  of prostate cancer    DEC 2008--  S/P  RADICAL PROSTATECTOMY  . Hyperlipidemia   . Hypertension   . Hypogonadism male   . Mixed hyperlipidemia   . Nocturia   . Sigmoid diverticulosis    MILD  . Type 2 diabetes mellitus ([place])    Immunization History  Administered Date(s) Administered  . DTaP 02/17/2002  . Influenza Whole 01/08/2013  . Influenza, High Dose Seasonal PF 02/26/2014, 12/24/2015  . Pneumococcal Conjugate-13 07/23/2015  . Pneumococcal Polysaccharide-23 02/18/1996  . Tdap 06/29/2011  . Zoster 03/23/2011   Past Surgical History:  Procedure Laterality Date  . ACHILLES TENDON REPAIR Right 1989  . COLONOSCOPY N/A last one 10-28-2010    due 10 yr f/u in 2022 -  [facility]  . FINGER AMPUTATION  age 8   left Middle  . NEGATIVE SLEEP STUDY  2010  per pt  . PENILE PROSTHESIS IMPLANT N/A 10/28/2014   Procedure: IMPLANTATION 3 PIECE PENILE INFLATABLE PROTHESIS/COLOPLAST SCROTAL APPROACH;  Surgeon: Kathie Rhodes, MD;  Location: [facility];  Service: Urology;  Laterality: N/A;  . PERCUTANEOUS NEPHROLITHOTRIPSY Left 04-04-2009// second look 04-15-2009  . ROBOT ASSISTED LAPAROSCOPIC RADICAL PROSTATECTOMY  Dec 2008  . SHOULDER OPEN ROTATOR CUFF REPAIR Right 05-11-2006  . URETEROLITHOTOMY  2002   FHx:    Reviewed / unchanged  SHx:    Reviewed / unchanged  Systems Review:  Constitutional: Denies fever, chills, wt changes, headaches, insomnia, fatigue, night sweats, change in appetite. Eyes: Denies redness, blurred vision, diplopia, discharge, itchy, watery eyes.  ENT: Denies discharge, congestion, post nasal drip, epistaxis, sore throat, earache, hearing loss, dental pain, tinnitus, vertigo, sinus pain, snoring.  CV: Denies chest pain, palpitations, irregular heartbeat, syncope, dyspnea, diaphoresis, orthopnea, PND, claudication or edema. Respiratory: denies cough, dyspnea, DOE, pleurisy, hoarseness, laryngitis, wheezing.  Gastrointestinal: Denies dysphagia, odynophagia, heartburn, reflux, water brash, abdominal pain or cramps, nausea, vomiting, bloating, diarrhea, constipation, hematemesis, melena, hematochezia  or hemorrhoids. Genitourinary: Denies dysuria, frequency, urgency, nocturia, hesitancy, discharge, hematuria or flank pain. Musculoskeletal: Denies arthralgias, myalgias, stiffness, jt. swelling, pain, limping or strain/sprain.  Skin: Denies pruritus, rash, hives, warts, acne, eczema or change in skin lesion(s). Neuro: No weakness, tremor, incoordination, spasms, paresthesia or pain. Psychiatric: Denies confusion, memory loss or sensory loss. Endo: Denies change in weight, skin or hair change.  Heme/Lymph: No excessive bleeding, bruising  or enlarged lymph nodes.  Physical Exam  BP 116/76   Pulse (!) 56   Temp 97.3 F (36.3 C)   Resp 16   Ht 5' 5.5" (1.664 m)   Wt 195 lb 3.2 oz (88.5 kg)   BMI 31.99 kg/m   Appears well nourished and in no distress.  Eyes: PERRLA, EOMs, conjunctiva no swelling or erythema. Sinuses: No frontal/maxillary tenderness ENT/Mouth: EAC's clear, TM's nl w/o erythema, bulging. Nares clear w/o erythema, swelling, exudates. Oropharynx clear without erythema or exudates. Oral hygiene is good. Tongue normal, non obstructing. Hearing intact.  Neck: Supple. Thyroid nl. Car 2+/2+ without bruits, nodes or JVD. Chest: Respirations nl with BS clear & equal w/o rales, rhonchi, wheezing or stridor.  Cor: Heart sounds normal w/ regular rate and rhythm without sig. murmurs, gallops, clicks, or rubs. Peripheral pulses normal and equal  without edema.  Abdomen: Soft & bowel sounds normal. Non-tender w/o guarding, rebound, hernias, masses, or organomegaly.  Lymphatics: Unremarkable.  Musculoskeletal: Full ROM all peripheral extremities, joint stability, 5/5 strength, and normal gait.  Skin: Warm, dry without exposed rashes, lesions or ecchymosis apparent.  Neuro: Cranial  nerves intact, reflexes equal bilaterally. Sensory-motor testing grossly intact. Tendon reflexes grossly intact.  Pysch: Alert & oriented x 3.  Insight and judgement nl & appropriate. No ideations.  Assessment and Plan:   1. Essential hypertension  - Continue medication, monitor blood pressure at home.  - Continue DASH diet. Reminder to go to the ER if any CP,  SOB, nausea, dizziness, severe HA, changes vision/speech,  left arm numbness and tingling and jaw pain.  - CBC with Differential/Platelet - BASIC METABOLIC PANEL WITH GFR - Magnesium - TSH  2. Mixed hyperlipidemia  - Continue diet/meds, exercise,& lifestyle modifications.  - Continue monitor periodic cholesterol/liver & renal functions   - Hepatic function panel - Lipid  panel - TSH  3. T2_NIDDM w/CKD2  ([place])  - Continue diet, exercise, lifestyle modifications.  - Monitor appropriate labs. - Hemoglobin A1c - Insulin, random  4. Vitamin D deficiency  - Continue supplementation.  - VITAMIN D 25 Hydroxy   5. Medication management  - CBC with Differential/Platelet - BASIC METABOLIC PANEL WITH GFR - Hepatic function panel - Magnesium - Lipid panel - TSH - Hemoglobin A1c - Insulin, random - VITAMIN D 25 Hydroxy        Recommended regular exercise, BP monitoring, weight control, and discussed med and SE's. Recommended labs to assess and monitor clinical status. Further disposition pending results of labs. Over 30 minutes of exam, counseling, chart review was performed

## 2016-07-10 LAB — VITAMIN D 25 HYDROXY (VIT D DEFICIENCY, FRACTURES): Vit D, 25-Hydroxy: 53 ng/mL (ref 30–100)

## 2016-07-10 LAB — HEMOGLOBIN A1C
HEMOGLOBIN A1C: 5.9 % — AB (ref <5.7)
Mean Plasma Glucose: 123 mg/dL

## 2016-07-10 LAB — MAGNESIUM: Magnesium: 1.7 mg/dL (ref 1.5–2.5)

## 2016-07-12 LAB — INSULIN, RANDOM: Insulin: 37.6 u[IU]/mL — ABNORMAL HIGH (ref 2.0–19.6)

## 2016-07-14 ENCOUNTER — Ambulatory Visit: Payer: Self-pay | Admitting: Physician Assistant

## 2016-08-05 ENCOUNTER — Ambulatory Visit (INDEPENDENT_AMBULATORY_CARE_PROVIDER_SITE_OTHER): Payer: Medicare Other | Admitting: Internal Medicine

## 2016-08-05 VITALS — BP 104/72 | HR 68 | Temp 97.0°F | Resp 16 | Ht 65.5 in | Wt 197.6 lb

## 2016-08-05 DIAGNOSIS — H00022 Hordeolum internum right lower eyelid: Secondary | ICD-10-CM | POA: Diagnosis not present

## 2016-08-05 MED ORDER — DOXYCYCLINE HYCLATE 100 MG PO CAPS: ORAL_CAPSULE | ORAL | 0 refills | Status: DC

## 2016-08-05 MED ORDER — NEOMYCIN-POLYMYXIN-DEXAMETH 0.1 % OP OINT: TOPICAL_OINTMENT | OPHTHALMIC | 1 refills | Status: AC

## 2016-08-05 NOTE — Patient Instructions (Signed)
Stye A stye is a bump on your eyelid caused by a bacterial infection. A stye can form inside the eyelid (internal stye) or outside the eyelid (external stye). An internal stye may be caused by an infected oil-producing gland inside your eyelid. An external stye may be caused by an infection at the base of your eyelash (hair follicle). Styes are very common. Anyone can get them at any age. They usually occur in just one eye, but you may have more than one in either eye. What are the causes? The infection is almost always caused by bacteria called Staphylococcus aureus. This is a common type of bacteria that lives on your skin. What increases the risk? You may be at higher risk for a stye if you have had one before. You may also be at higher risk if you have:  Diabetes.  Long-term illness.  Long-term eye redness.  A skin condition called seborrhea.  High fat levels in your blood (lipids). What are the signs or symptoms? Eyelid pain is the most common symptom of a stye. Internal styes are more painful than external styes. Other signs and symptoms may include:  Painful swelling of your eyelid.  A scratchy feeling in your eye.  Tearing and redness of your eye.  Pus draining from the stye. How is this diagnosed? Your health care provider may be able to diagnose a stye just by examining your eye. The health care provider may also check to make sure:  You do not have a fever or other signs of a more serious infection.  The infection has not spread to other parts of your eye or areas around your eye. How is this treated? Most styes will clear up in a few days without treatment. In some cases, you may need to use antibiotic drops or ointment to prevent infection. Your health care provider may have to drain the stye surgically if your stye is:  Large.  Causing a lot of pain.  Interfering with your vision. This can be done using a thin blade or a needle. Follow these instructions at  home:  Take medicines only as directed by your health care provider.  Apply a clean, warm compress to your eye for 10 minutes, 4 times a day.  Do not wear contact lenses or eye makeup until your stye has healed.  Do not try to pop or drain the stye. Contact a health care provider if:  You have chills or a fever.  Your stye does not go away after several days.  Your stye affects your vision.  Your eyeball becomes swollen, red, or painful. This information is not intended to replace advice given to you by your health care provider. Make sure you discuss any questions you have with your health care provider. Document Released: 01/13/2005 Document Revised: 11/30/2015 Document Reviewed: 07/20/2013 Elsevier Interactive Patient Education  2017 [employer].

## 2016-08-06 ENCOUNTER — Encounter: Payer: Self-pay | Admitting: Internal Medicine

## 2016-08-06 DIAGNOSIS — E0865 Diabetes mellitus due to underlying condition with hyperglycemia: Secondary | ICD-10-CM | POA: Diagnosis not present

## 2016-08-06 DIAGNOSIS — H25011 Cortical age-related cataract, right eye: Secondary | ICD-10-CM | POA: Diagnosis not present

## 2016-08-06 DIAGNOSIS — H00032 Abscess of right lower eyelid: Secondary | ICD-10-CM | POA: Diagnosis not present

## 2016-08-06 DIAGNOSIS — H00023 Hordeolum internum right eye, unspecified eyelid: Secondary | ICD-10-CM | POA: Diagnosis not present

## 2016-08-06 NOTE — Progress Notes (Signed)
  Subjective:    Patient ID: Cody Zamora, male    DOB: 1951/04/01, 66 y.o.   MRN: [id number]  HPI    Patient presents with c/o of a stye of the R lower eye lid x 3 days  Medication Sig  . allopurinol (ZYLOPRIM) 300 MG tablet Take 1 tablet (300 mg total) by mouth daily.  Marland Kitchen aspirin 81 MG tablet Take 81 mg by mouth daily.    . bisoprolol-hydrochlorothiazide (ZIAC) 5-6.25 MG tablet 1/2-1 life at daily for BP  . Cholecalciferol (VITAMIN D3) 2000 UNITS capsule Take 6,000 Units by mouth daily.  . cyclobenzaprine (FLEXERIL) 10 MG tablet Take 1 tablet (10 mg total) by mouth 3 (three) times daily as needed for muscle spasms.  . fluticasone (FLONASE) 50 MCG/ACT nasal spray Place 1 spray into both nostrils daily as needed for allergies.   Marland Kitchen LORazepam (ATIVAN) 1 MG tablet Take 1 tablet (1 mg total) by mouth 2 (two) times daily as needed for anxiety.  . metFORMIN (GLUCOPHAGE-XR) 500 MG 24 hr tablet TAKE 2 TABLETS TWICE A DAY WITH MEALS AS DIRECTED  . Multiple Vitamins-Minerals (MULTIVITAMIN WITH MINERALS) tablet Take 1 tablet by mouth daily.    . Omega-3 Fatty Acids (FISH OIL) 1000 MG CAPS Take 2 capsules by mouth daily.  . traMADol (ULTRAM) 50 MG tablet Take 1 tablet (50 mg total) by mouth every 6 (six) hours as needed for moderate pain or severe pain.  . valsartan (DIOVAN) 320 MG tablet Take 1 tablet (320 mg total) by mouth daily.  . Wheat Dextrin (BENEFIBER DRINK MIX PO) Take 1 Package by mouth daily.   Marland Kitchen ZETIA 10 MG tablet TAKE 1 TABLET DAILY   Allergies  Allergen Reactions  . Cialis [Tadalafil] Other (See Comments)    Hot flashes  . Fructose Nausea And Vomiting  . Morphine And Related Itching   Past Medical History:  Diagnosis Date  . ED (erectile dysfunction) of organic origin   . History of colon polyps   . History of kidney stones   . History of positive PPD    06/ 2006--  CXR NORMAL  . History of prostate cancer    DEC 2008--  S/P  RADICAL PROSTATECTOMY  . Hyperlipidemia   .  Hypertension   . Hypogonadism male   . Mixed hyperlipidemia   . Nocturia   . Sigmoid diverticulosis    MILD  . Type 2 diabetes mellitus ([place])              Review of Systems  10 point systems review negative except as above.      Objective:   Physical Exam  BP 104/72   Pulse 68   Temp 97 F (36.1 C)   Resp 16   Ht 5' 5.5" (1.664 m)   Wt 197 lb 9.6 oz (89.6 kg)   BMI 32.38 kg/m   HEENT . TM's Nl. EOM's Nl. PERRLA. Sclera/conjunctiva clear. There is a stye of the inner lower RE.  Otherwise negative.  Neck- supple  Chest - Clear Cor- RRR w/o M.  MS/N - WNL    Assessment & Plan:   1. Hordeolum internum of right lower eyelid  - doxycycline (VIBRAMYCIN) 100 MG capsule; Take 1 capsule 2 x/day with food for infection  Dispense: 20 capsule; Refill: 0  - neomycin-polymyxin-dexameth (MAXITROL) 0.1 % OINT; Apply to lower eyelid 4 x / day and bedtime  Dispense: 3.5 g; Refill: 1

## 2016-10-19 ENCOUNTER — Ambulatory Visit (INDEPENDENT_AMBULATORY_CARE_PROVIDER_SITE_OTHER): Payer: Medicare Other | Admitting: Physician Assistant

## 2016-10-19 ENCOUNTER — Ambulatory Visit: Payer: Self-pay | Admitting: Internal Medicine

## 2016-10-19 ENCOUNTER — Encounter: Payer: Self-pay | Admitting: Physician Assistant

## 2016-10-19 VITALS — BP 126/80 | HR 68 | Temp 97.5°F | Resp 16 | Ht 65.5 in | Wt 196.2 lb

## 2016-10-19 DIAGNOSIS — Z0001 Encounter for general adult medical examination with abnormal findings: Secondary | ICD-10-CM | POA: Diagnosis not present

## 2016-10-19 DIAGNOSIS — F411 Generalized anxiety disorder: Secondary | ICD-10-CM

## 2016-10-19 DIAGNOSIS — N182 Chronic kidney disease, stage 2 (mild): Secondary | ICD-10-CM

## 2016-10-19 DIAGNOSIS — Z79899 Other long term (current) drug therapy: Secondary | ICD-10-CM

## 2016-10-19 DIAGNOSIS — E559 Vitamin D deficiency, unspecified: Secondary | ICD-10-CM

## 2016-10-19 DIAGNOSIS — E1122 Type 2 diabetes mellitus with diabetic chronic kidney disease: Secondary | ICD-10-CM

## 2016-10-19 DIAGNOSIS — N200 Calculus of kidney: Secondary | ICD-10-CM | POA: Diagnosis not present

## 2016-10-19 DIAGNOSIS — I10 Essential (primary) hypertension: Secondary | ICD-10-CM

## 2016-10-19 DIAGNOSIS — Z0000 Encounter for general adult medical examination without abnormal findings: Secondary | ICD-10-CM

## 2016-10-19 DIAGNOSIS — E782 Mixed hyperlipidemia: Secondary | ICD-10-CM | POA: Diagnosis not present

## 2016-10-19 DIAGNOSIS — N5231 Erectile dysfunction following radical prostatectomy: Secondary | ICD-10-CM | POA: Diagnosis not present

## 2016-10-19 DIAGNOSIS — C61 Malignant neoplasm of prostate: Secondary | ICD-10-CM | POA: Diagnosis not present

## 2016-10-19 DIAGNOSIS — R6889 Other general symptoms and signs: Secondary | ICD-10-CM | POA: Diagnosis not present

## 2016-10-19 DIAGNOSIS — S68119D Complete traumatic metacarpophalangeal amputation of unspecified finger, subsequent encounter: Secondary | ICD-10-CM

## 2016-10-19 LAB — BASIC METABOLIC PANEL WITH GFR
BUN: 14 mg/dL (ref 7–25)
CALCIUM: 9.4 mg/dL (ref 8.6–10.3)
CHLORIDE: 104 mmol/L (ref 98–110)
CO2: 24 mmol/L (ref 20–31)
CREATININE: 1.17 mg/dL (ref 0.70–1.25)
GFR, Est African American: 74 mL/min (ref >=60)
GFR, Est Non African American: 64 mL/min (ref >=60)
GLUCOSE: 106 mg/dL — AB (ref 65–99)
Potassium: 4 mmol/L (ref 3.5–5.3)
SODIUM: 139 mmol/L (ref 135–146)

## 2016-10-19 LAB — TSH: TSH: 1.19 m[IU]/L (ref 0.40–4.50)

## 2016-10-19 LAB — CBC WITH DIFFERENTIAL/PLATELET
BASOS ABS: 0 {cells}/uL (ref 0–200)
Basophils Relative: 0 %
EOS PCT: 3 %
Eosinophils Absolute: 243 cells/uL (ref 15–500)
HCT: 44.7 % (ref 38.5–50.0)
HEMOGLOBIN: 14.9 g/dL (ref 13.2–17.1)
Lymphocytes Relative: 42 %
Lymphs Abs: 3402 cells/uL (ref 850–3900)
MCH: 29.9 pg (ref 27.0–33.0)
MCHC: 33.3 g/dL (ref 32.0–36.0)
MCV: 89.8 fL (ref 80.0–100.0)
MPV: 9.2 fL (ref 7.5–12.5)
Monocytes Absolute: 486 cells/uL (ref 200–950)
Monocytes Relative: 6 %
NEUTROS ABS: 3969 {cells}/uL (ref 1500–7800)
Neutrophils Relative %: 49 %
Platelets: 221 10*3/uL (ref 140–400)
RBC: 4.98 MIL/uL (ref 4.20–5.80)
RDW: 14.7 % (ref 11.0–15.0)
WBC: 8.1 10*3/uL (ref 3.8–10.8)

## 2016-10-19 LAB — LIPID PANEL
CHOLESTEROL: 159 mg/dL (ref <200)
HDL: 25 mg/dL — ABNORMAL LOW (ref >40)
LDL Cholesterol: 86 mg/dL (ref <100)
Total CHOL/HDL Ratio: 6.4 Ratio — ABNORMAL HIGH (ref <5.0)
Triglycerides: 242 mg/dL — ABNORMAL HIGH (ref <150)
VLDL: 48 mg/dL — ABNORMAL HIGH (ref <30)

## 2016-10-19 LAB — HEPATIC FUNCTION PANEL
ALT: 21 U/L (ref 9–46)
AST: 24 U/L (ref 10–35)
Albumin: 4.3 g/dL (ref 3.6–5.1)
Alkaline Phosphatase: 81 U/L (ref 40–115)
Bilirubin, Direct: 0.1 mg/dL (ref <=0.2)
Indirect Bilirubin: 0.3 mg/dL (ref 0.2–1.2)
TOTAL PROTEIN: 7.3 g/dL (ref 6.1–8.1)
Total Bilirubin: 0.4 mg/dL (ref 0.2–1.2)

## 2016-10-19 NOTE — Progress Notes (Signed)
MEDICARE ANNUAL WELLNESS VISIT AND OV  Assessment:   Assessment and Plan:   Essential hypertension - continue medications, DASH diet, exercise and monitor at home. Call if greater than 130/80.  -     CBC with Differential/Platelet -     BASIC METABOLIC PANEL WITH GFR -     Hepatic function panel -     TSH  Type 2 diabetes mellitus with stage 2 chronic kidney disease, without long-term current use of insulin ([place]) Discussed general issues about diabetes pathophysiology and management., Educational material distributed., Suggested low cholesterol diet., Encouraged aerobic exercise., Discussed foot care., Reminded to get yearly retinal exam. -     Hemoglobin A1c  Nephrolithiasis Increase fluids  Malignant neoplasm of prostate ([place]) Continue follow up urology  Vitamin D deficiency  Mixed hyperlipidemia -continue medications, check lipids, decrease fatty foods, increase activity.  -     Lipid panel  Medication management -     Magnesium  Generalized anxiety disorder  continue medications, stress management techniques discussed, increase water, good sleep hygiene discussed, increase exercise, and increase veggies.   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 Further disposition pending results of labs. Discussed med's effects and SE's.    Over 30 minutes of exam, counseling, chart review, and critical decision making was performed  Plan:   During the course of the visit the patient was educated and counseled about appropriate screening and preventive services including:    Pneumococcal vaccine   Influenza vaccine  Td vaccine  Screening electrocardiogram  Bone densitometry screening  Colorectal cancer screening  Diabetes screening  Glaucoma  screening  Nutrition counseling   Advanced directives: requested   HPI 66 y.o.  AA male  presents for 3 month follow up on hypertension, cholesterol, diabetes and vitamin D deficiency and wellness visit. Marland Kitchen   His blood pressure has been controlled at home, today his BP is BP: 126/80.  He does not workout. He denies chest pain, shortness of breath, dizziness.  He is on cholesterol medication and denies myalgias. His cholesterol is at goal. The cholesterol was:   Lab Results  Component Value Date   CHOL 129 07/09/2016   HDL 28 (L) 07/09/2016   LDLCALC 54 07/09/2016   TRIG 235 (H) 07/09/2016   CHOLHDL 4.6 07/09/2016    He has been working on diet and exercise for diabetes with diabetic chronic kidney disease, he is on bASA, he is on ACE/ARB, and denies  paresthesia of the feet, polydipsia, polyuria and visual disturbances. Last A1C was:  Lab Results  Component Value Date   HGBA1C 5.9 (H) 07/09/2016   Lab Results  Component Value Date   GFRAA 67 07/09/2016    Patient is on Vitamin D supplement. Lab Results  Component Value Date   VD25OH 53 07/09/2016   BMI is Body mass index is 32.15 kg/m., he is working on diet and exercise. Wife, linda, doing dialysis.  Wt Readings from Last 3 Encounters:  10/19/16 196 lb 3.2 oz (89 kg)  08/05/16 197 lb 9.6 oz (89.6 kg)  07/09/16 195 lb 3.2 oz (88.5 kg)   Current Medications:  Current Outpatient Prescriptions on File Prior to Visit  Medication Sig Dispense Refill  . allopurinol (ZYLOPRIM) 300 MG tablet Take 1 tablet (300 mg total) by mouth daily. 90 tablet 2  . aspirin  81 MG tablet Take 81 mg by mouth daily.      . bisoprolol-hydrochlorothiazide (ZIAC) 5-6.25 MG tablet 1/2-1 life at daily for BP 90 tablet 3  . Cholecalciferol (VITAMIN D3) 2000 UNITS capsule Take 6,000 Units by mouth daily.    . cyclobenzaprine (FLEXERIL) 10 MG tablet Take 1 tablet (10 mg total) by mouth 3 (three) times daily as needed for muscle spasms. 90 tablet 0  .  fluticasone (FLONASE) 50 MCG/ACT nasal spray Place 1 spray into both nostrils daily as needed for allergies.     Marland Kitchen LORazepam (ATIVAN) 1 MG tablet Take 1 tablet (1 mg total) by mouth 2 (two) times daily as needed for anxiety. 60 tablet 0  . metFORMIN (GLUCOPHAGE-XR) 500 MG 24 hr tablet TAKE 2 TABLETS TWICE A DAY WITH MEALS AS DIRECTED 360 tablet 3  . Multiple Vitamins-Minerals (MULTIVITAMIN WITH MINERALS) tablet Take 1 tablet by mouth daily.      . Omega-3 Fatty Acids (FISH OIL) 1000 MG CAPS Take 2 capsules by mouth daily.    . valsartan (DIOVAN) 320 MG tablet Take 1 tablet (320 mg total) by mouth daily. 90 tablet 3  . Wheat Dextrin (BENEFIBER DRINK MIX PO) Take 1 Package by mouth daily.     Marland Kitchen ZETIA 10 MG tablet TAKE 1 TABLET DAILY 90 tablet 3   No current facility-administered medications on file prior to visit.    Medical History:  Past Medical History:  Diagnosis Date  . ED (erectile dysfunction) of organic origin   . History of colon polyps   . History of kidney stones   . History of positive PPD    06/ 2006--  CXR NORMAL  . History of prostate cancer    DEC 2008--  S/P  RADICAL PROSTATECTOMY  . Hyperlipidemia   . Hypertension   . Hypogonadism male   . Mixed hyperlipidemia   . Nocturia   . Sigmoid diverticulosis    MILD  . Type 2 diabetes mellitus ([place])    Preventative care: Immunization History  Administered Date(s) Administered  . DTaP 02/17/2002  . Influenza Whole 01/08/2013  . Influenza, High Dose Seasonal PF 02/26/2014, 12/24/2015  . Pneumococcal Conjugate-13 07/23/2015  . Pneumococcal Polysaccharide-23 02/18/1996  . Tdap 06/29/2011  . Zoster 03/23/2011    Last colonoscopy: 10/28/2010 - Dr Deatra Ina - 64 yr f/u.  DEXA 2011 CXR 2013  Tdap 2013 Pneumonia 1997 Prevnar 13: 2017 Influenza 2017 Shingles 2012  Names of Other Physician/Practitioners you currently use: 1. [facility] here for primary care 2. Dr Frederico Hamman, eye  doctor,  3. Dr Lara Mulch, dentist, last visit Jan 2018  Patient Care Team: [name], MD as PCP - General (Internal Medicine) Lowella Bandy, MD as Consulting Physician (Urology) Inda Castle, MD as Consulting Physician (Gastroenterology)   Allergies Allergies  Allergen Reactions  . Cialis [Tadalafil] Other (See Comments)    Hot flashes  . Fructose Nausea And Vomiting  . Morphine And Related Itching    SURGICAL HISTORY He  has a past surgical history that includes Finger amputation (age 54); Robot assisted laparoscopic radical prostatectomy (Dec 2008); Colonoscopy (N/A, last one 10-28-2010); Percutaneous nephrolithotripsy (Left, 04-04-2009// second look 04-15-2009); Ureterolithotomy (2002); Shoulder open rotator cuff repair (Right, 05-11-2006); Achilles tendon repair (Right, 1989); NEGATIVE SLEEP STUDY (2010  per pt); and Penile prosthesis implant (N/A, 10/28/2014). FAMILY HISTORY His family history includes Alcohol abuse in his father; Arthritis in his mother; Diabetes in his mother; Heart disease in his mother; Hypertension  in his father. SOCIAL HISTORY He  reports that he has never smoked. He has never used smokeless tobacco. He reports that he does not drink alcohol or use drugs.  MEDICARE WELLNESS OBJECTIVES: Physical activity: Current Exercise Habits: The patient does not participate in regular exercise at present Cardiac risk factors: Cardiac Risk Factors include: advanced age (>2men, >61 women);diabetes mellitus;dyslipidemia;hypertension;male gender;obesity (BMI >30kg/m2);sedentary lifestyle Depression/mood screen:   Depression screen [facility] 2/9 10/19/2016  Decreased Interest 0  Down, Depressed, Hopeless 0  PHQ - 2 Score 0    ADLs:  In your present state of health, do you have any difficulty performing the following activities: 10/19/2016 07/09/2016  Hearing? N N  Vision? N N  Difficulty concentrating or making decisions? N N  Walking or climbing stairs? N N  Dressing or  bathing? N N  Doing errands, shopping? N N  Some recent data might be hidden     Cognitive Testing  Alert? Yes  Normal Appearance?Yes  Oriented to person? Yes  Place? Yes   Time? Yes  Recall of three objects?  Yes  Can perform simple calculations? Yes  Displays appropriate judgment?Yes  Can read the correct time from a watch face?Yes  EOL planning: Does Patient Have a Medical Advance Directive?: No Would patient like information on creating a medical advance directive?: No - Patient declined  Review of Systems:  Review of Systems  Constitutional: Negative.   HENT: Negative.   Eyes: Negative.   Respiratory: Negative.   Cardiovascular: Negative.   Gastrointestinal: Negative for abdominal pain, blood in stool, constipation, diarrhea, heartburn, melena, nausea and vomiting.  Genitourinary: Negative.   Musculoskeletal: Negative.   Skin: Negative.   Neurological: Negative.   Endo/Heme/Allergies: Negative.   Psychiatric/Behavioral: Negative for depression, hallucinations, memory loss, substance abuse and suicidal ideas. The patient is not nervous/anxious and does not have insomnia.    Physical Exam: BP 126/80   Pulse 68   Temp (!) 97.5 F (36.4 C)   Resp 16   Ht 5' 5.5" (1.664 m)   Wt 196 lb 3.2 oz (89 kg)   BMI 32.15 kg/m  Wt Readings from Last 3 Encounters:  10/19/16 196 lb 3.2 oz (89 kg)  08/05/16 197 lb 9.6 oz (89.6 kg)  07/09/16 195 lb 3.2 oz (88.5 kg)   General Appearance: Well nourished, in no apparent distress. Eyes: PERRLA, EOMs, conjunctiva no swelling or erythema Sinuses: No Frontal/maxillary tenderness ENT/Mouth: Ext aud canals clear, TMs without erythema, bulging. No erythema, swelling, or exudate on post pharynx.  Tonsils not swollen or erythematous. Hearing decreased but has hearing aids Neck: Supple, thyroid normal.  Respiratory: Respiratory effort normal, BS equal bilaterally without rales, rhonchi, wheezing or stridor.  Cardio: RRR with no MRGs. Brisk  peripheral pulses without edema.  Abdomen: Soft, + BS.  Non tender, no guarding, rebound, hernias, masses. Lymphatics: Non tender without lymphadenopathy.  Musculoskeletal: Full ROM, 5/5 strength, Normal gait Skin: Warm, dry without rashes, lesions, ecchymosis.  Neuro: Cranial nerves intact. No cerebellar symptoms.  Psych: Awake and oriented X 3, normal affect, Insight and Judgment appropriate.   Medicare Attestation I have personally reviewed: The patient's medical and social history Their use of alcohol, tobacco or illicit drugs Their current medications and supplements The patient's functional ability including ADLs,fall risks, home safety risks, cognitive, and hearing and visual impairment Diet and physical activities Evidence for depression or mood disorders  The patient's weight, height, BMI, and visual acuity have been recorded in the chart.  I have made  referrals, counseling, and provided education to the patient based on review of the above and I have provided the patient with a written personalized care plan for preventive services.     Vicie Mutters, PA-C 12:20 PM [facility]

## 2016-10-19 NOTE — Patient Instructions (Addendum)
Omron wrist cuff for BP  Try to get 80-100 oz of water daily     Bad carbs also include fruit juice, alcohol, and sweet tea. These are empty calories that do not signal to your brain that you are full.   Please remember the good carbs are still carbs which convert into sugar. So please measure them out no more than 1/2-1 cup of rice, oatmeal, pasta, and beans  Veggies are however free foods! Pile them on.   Not all fruit is created equal. Please see the list below, the fruit at the bottom is higher in sugars than the fruit at the top. Please avoid all dried fruits.      Monitor your blood pressure at home. Go to the ER if any CP, SOB, nausea, dizziness, severe HA, changes vision/speech  Goal BP:  For patients younger than 60: Goal BP < 140/90. For patients 60 and older: Goal BP < 150/90. For patients with diabetes: Goal BP < 140/90. Your most recent BP: BP: 126/80   Take your medications faithfully as instructed. Maintain a healthy weight. Get at least 150 minutes of aerobic exercise per week. Minimize salt intake. Minimize alcohol intake  DASH Eating Plan DASH stands for "Dietary Approaches to Stop Hypertension." The DASH eating plan is a healthy eating plan that has been shown to reduce high blood pressure (hypertension). Additional health benefits may include reducing the risk of type 2 diabetes mellitus, heart disease, and stroke. The DASH eating plan may also help with weight loss. WHAT DO I NEED TO KNOW ABOUT THE DASH EATING PLAN? For the DASH eating plan, you will follow these general guidelines:  Choose foods with a percent daily value for sodium of less than 5% (as listed on the food label).  Use salt-free seasonings or herbs instead of table salt or sea salt.  Check with your health care provider or pharmacist before using salt substitutes.  Eat lower-sodium products, often labeled as "lower sodium" or "no salt added."  Eat fresh foods.  Eat more vegetables,  fruits, and low-fat dairy products.  Choose whole grains. Look for the word "whole" as the first word in the ingredient list.  Choose fish and skinless chicken or [place] more often than red meat. Limit fish, poultry, and meat to 6 oz (170 g) each day.  Limit sweets, desserts, sugars, and sugary drinks.  Choose heart-healthy fats.  Limit cheese to 1 oz (28 g) per day.  Eat more home-cooked food and less restaurant, buffet, and fast food.  Limit fried foods.  Cook foods using methods other than frying.  Limit canned vegetables. If you do use them, rinse them well to decrease the sodium.  When eating at a restaurant, ask that your food be prepared with less salt, or no salt if possible. WHAT FOODS CAN I EAT? Seek help from a dietitian for individual calorie needs. Grains Whole grain or whole wheat bread. Brown rice. Whole grain or whole wheat pasta. Quinoa, bulgur, and whole grain cereals. Low-sodium cereals. Corn or whole wheat flour tortillas. Whole grain cornbread. Whole grain crackers. Low-sodium crackers. Vegetables Fresh or frozen vegetables (raw, steamed, roasted, or grilled). Low-sodium or reduced-sodium tomato and vegetable juices. Low-sodium or reduced-sodium tomato sauce and paste. Low-sodium or reduced-sodium canned vegetables.  Fruits All fresh, canned (in natural juice), or frozen fruits. Meat and Other Protein Products Ground beef (85% or leaner), grass-fed beef, or beef trimmed of fat. Skinless chicken or [place]. Ground chicken or [place]. Pork trimmed of  fat. All fish and seafood. Eggs. Dried beans, peas, or lentils. Unsalted nuts and seeds. Unsalted canned beans. Dairy Low-fat dairy products, such as skim or 1% milk, 2% or reduced-fat cheeses, low-fat ricotta or cottage cheese, or plain low-fat yogurt. Low-sodium or reduced-sodium cheeses. Fats and Oils Tub margarines without trans fats. Light or reduced-fat mayonnaise and salad dressings (reduced sodium). Avocado.  Safflower, olive, or canola oils. Natural peanut or almond butter. Other Unsalted popcorn and pretzels. The items listed above may not be a complete list of recommended foods or beverages. Contact your dietitian for more options. WHAT FOODS ARE NOT RECOMMENDED? Grains White bread. White pasta. White rice. Refined cornbread. Bagels and croissants. Crackers that contain trans fat. Vegetables Creamed or fried vegetables. Vegetables in a cheese sauce. Regular canned vegetables. Regular canned tomato sauce and paste. Regular tomato and vegetable juices. Fruits Dried fruits. Canned fruit in light or heavy syrup. Fruit juice. Meat and Other Protein Products Fatty cuts of meat. Ribs, chicken wings, bacon, sausage, bologna, salami, chitterlings, fatback, hot dogs, bratwurst, and packaged luncheon meats. Salted nuts and seeds. Canned beans with salt. Dairy Whole or 2% milk, cream, half-and-half, and cream cheese. Whole-fat or sweetened yogurt. Full-fat cheeses or blue cheese. Nondairy creamers and whipped toppings. Processed cheese, cheese spreads, or cheese curds. Condiments Onion and garlic salt, seasoned salt, table salt, and sea salt. Canned and packaged gravies. Worcestershire sauce. Tartar sauce. Barbecue sauce. Teriyaki sauce. Soy sauce, including reduced sodium. Steak sauce. Fish sauce. Oyster sauce. Cocktail sauce. Horseradish. Ketchup and mustard. Meat flavorings and tenderizers. Bouillon cubes. Hot sauce. Tabasco sauce. Marinades. Taco seasonings. Relishes. Fats and Oils Butter, stick margarine, lard, shortening, ghee, and bacon fat. Coconut, palm kernel, or palm oils. Regular salad dressings. Other Pickles and olives. Salted popcorn and pretzels. The items listed above may not be a complete list of foods and beverages to avoid. Contact your dietitian for more information. WHERE CAN I FIND MORE INFORMATION? National Heart, Lung, and Blood Institute:  travelstabloid.com Document Released: 03/25/2011 Document Revised: 08/20/2013 Document Reviewed: 02/07/2013 [facility] Patient Information 2015 [place], [place]. This information is not intended to replace advice given to you by your health care provider. Make sure you discuss any questions you have with your health care provider.

## 2016-10-20 LAB — HEMOGLOBIN A1C
HEMOGLOBIN A1C: 6.2 % — AB (ref <5.7)
MEAN PLASMA GLUCOSE: 131 mg/dL

## 2016-10-20 LAB — MAGNESIUM: MAGNESIUM: 2 mg/dL (ref 1.5–2.5)

## 2016-10-21 NOTE — Progress Notes (Signed)
Pt aware of lab results & voiced understanding of those results.

## 2016-12-02 ENCOUNTER — Ambulatory Visit: Payer: Self-pay | Admitting: Physician Assistant

## 2017-01-19 ENCOUNTER — Other Ambulatory Visit: Payer: Self-pay | Admitting: Internal Medicine

## 2017-01-19 DIAGNOSIS — I10 Essential (primary) hypertension: Secondary | ICD-10-CM

## 2017-01-24 ENCOUNTER — Encounter: Payer: Self-pay | Admitting: Internal Medicine

## 2017-02-28 ENCOUNTER — Other Ambulatory Visit: Payer: Self-pay

## 2017-02-28 DIAGNOSIS — I10 Essential (primary) hypertension: Secondary | ICD-10-CM

## 2017-02-28 MED ORDER — BISOPROLOL-HYDROCHLOROTHIAZIDE 5-6.25 MG PO TABS: 1.0000 | ORAL_TABLET | Freq: Every day | ORAL | 1 refills | Status: DC

## 2017-03-21 ENCOUNTER — Ambulatory Visit (INDEPENDENT_AMBULATORY_CARE_PROVIDER_SITE_OTHER): Payer: Medicare Other | Admitting: Internal Medicine

## 2017-03-21 ENCOUNTER — Encounter: Payer: Self-pay | Admitting: Internal Medicine

## 2017-03-21 VITALS — BP 114/84 | HR 56 | Temp 97.7°F | Resp 18 | Ht 65.0 in | Wt 194.4 lb

## 2017-03-21 DIAGNOSIS — E559 Vitamin D deficiency, unspecified: Secondary | ICD-10-CM | POA: Diagnosis not present

## 2017-03-21 DIAGNOSIS — N182 Chronic kidney disease, stage 2 (mild): Secondary | ICD-10-CM | POA: Diagnosis not present

## 2017-03-21 DIAGNOSIS — M109 Gout, unspecified: Secondary | ICD-10-CM

## 2017-03-21 DIAGNOSIS — Z1212 Encounter for screening for malignant neoplasm of rectum: Secondary | ICD-10-CM

## 2017-03-21 DIAGNOSIS — Z79899 Other long term (current) drug therapy: Secondary | ICD-10-CM

## 2017-03-21 DIAGNOSIS — Z136 Encounter for screening for cardiovascular disorders: Secondary | ICD-10-CM | POA: Diagnosis not present

## 2017-03-21 DIAGNOSIS — E1122 Type 2 diabetes mellitus with diabetic chronic kidney disease: Secondary | ICD-10-CM | POA: Diagnosis not present

## 2017-03-21 DIAGNOSIS — I10 Essential (primary) hypertension: Secondary | ICD-10-CM | POA: Diagnosis not present

## 2017-03-21 DIAGNOSIS — E782 Mixed hyperlipidemia: Secondary | ICD-10-CM

## 2017-03-21 DIAGNOSIS — Z8546 Personal history of malignant neoplasm of prostate: Secondary | ICD-10-CM | POA: Diagnosis not present

## 2017-03-21 DIAGNOSIS — I2510 Atherosclerotic heart disease of native coronary artery without angina pectoris: Secondary | ICD-10-CM | POA: Insufficient documentation

## 2017-03-21 DIAGNOSIS — Z23 Encounter for immunization: Secondary | ICD-10-CM | POA: Diagnosis not present

## 2017-03-21 DIAGNOSIS — N390 Urinary tract infection, site not specified: Secondary | ICD-10-CM

## 2017-03-21 DIAGNOSIS — Z1211 Encounter for screening for malignant neoplasm of colon: Secondary | ICD-10-CM

## 2017-03-21 NOTE — Addendum Note (Signed)
Addended by: [name] on: 03/21/2017 07:19 PM   Modules accepted: Level of Service

## 2017-03-21 NOTE — Patient Instructions (Signed)

## 2017-03-21 NOTE — Progress Notes (Addendum)
[facility]   [name], M.D.     Uvaldo Bristle. Silverio Lay, P.A.-C Liane Comber, [place]                [address] [place], [place] [address] Telephone [phone] Telefax [phone] Comprehensive Evaluation & Examination     This very nice 66 y.o. MBM presents for a  comprehensive evaluation and management of multiple medical co-morbidities.  Patient has been followed for HTN, T2_NIDDM, HLD and Vitamin D Deficiency. Patient has hx/o Gout controlled w/Allopurinal & he's s/p Robotic Prostatectomy for Prostate Ca.      HTN predates circa 1996. Patient's BP has been controlled at home.  Today's BP is at goal - 114/84. Patient denies any cardiac symptoms as chest pain, palpitations, shortness of breath, dizziness or ankle swelling.     Patient's hyperlipidemia is controlled with diet and medications. Patient denies myalgias or other medication SE's. 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  He denies reactive hypoglycemic symptoms, visual blurring, diabetic polys or paresthesias. He admits not monitoring CBG's. Last A1c was not at goal: Lab Results  Component Value Date   HGBA1C 6.2 (H) 10/19/2016       Finally, patient has history of Vitamin D Deficiency ("11" / 2008)  and last vitamin D was improved: Lab Results  Component Value Date   VD25OH 51 07/09/2016   Current Outpatient Medications on File Prior to Visit  Medication Sig  . aspirin 81 MG tablet Take 81 mg by mouth daily.    . bisoprolol-hydrochlorothiazide (ZIAC) 5-6.25 MG tablet Take 1 tablet daily by mouth. for blood pressure  . Cholecalciferol  (VITAMIN D3) 2000 UNITS capsule Take 6,000 Units by mouth daily.  Marland Kitchen ezetimibe (ZETIA) 10 MG tablet TAKE 1 TABLET DAILY  . fluticasone (FLONASE) 50 MCG/ACT nasal spray Place 1 spray into both nostrils daily as needed for allergies.   . metFORMIN (GLUCOPHAGE-XR) 500 MG 24 hr tablet TAKE 2 TABLETS TWICE A DAY WITH MEALS AS DIRECTED  . Multiple Vitamins-Minerals (MULTIVITAMIN WITH MINERALS) tablet Take 1 tablet by mouth daily.    . Omega-3 Fatty Acids (FISH OIL) 1000 MG CAPS Take 2 capsules by mouth daily.  . valsartan (DIOVAN) 320 MG tablet Take 1 tablet (320 mg total) by mouth daily.  . Wheat Dextrin (BENEFIBER DRINK MIX PO) Take 1 Package by mouth daily.   Marland Kitchen allopurinol (ZYLOPRIM) 300 MG tablet Take 1 tablet (300 mg total) by mouth daily.   No current facility-administered medications on file prior to visit.    Allergies  Allergen Reactions  . Cialis [Tadalafil] Other (See Comments)    Hot flashes  . Fructose Nausea And Vomiting  . Morphine And Related Itching   Past Medical History:  Diagnosis Date  . ED (erectile dysfunction) of organic origin   . History of colon polyps   . History of kidney stones   . History of positive PPD    06/ 2006--  CXR NORMAL  . History of prostate cancer  DEC 2008--  S/P  RADICAL PROSTATECTOMY  . Hyperlipidemia   . Hypertension   . Hypogonadism male   . Mixed hyperlipidemia   . Nocturia   . Sigmoid diverticulosis    MILD  . Type 2 diabetes mellitus [facility])    Health Maintenance  Topic Date Due  . PNA vac Low Risk Adult (2 of 2 - PPSV23) 07/22/2016  . INFLUENZA VACCINE  11/17/2016  . HEMOGLOBIN A1C  04/21/2017  . OPHTHALMOLOGY EXAM  05/06/2017  . FOOT EXAM  10/19/2017  . COLONOSCOPY  10/27/2020  . TETANUS/TDAP  06/28/2021  . Hepatitis C Screening  Completed   Immunization History  Administered Date(s) Administered  . DTaP 02/17/2002  . Influenza Whole 01/08/2013  . Influenza, High Dose Seasonal PF 02/26/2014, 12/24/2015, 03/21/2017  .  Pneumococcal Conjugate-13 07/23/2015  . Pneumococcal Polysaccharide-23 02/18/1996  . Tdap 06/29/2011  . Zoster 03/23/2011   Past Surgical History:  Procedure Laterality Date  . ACHILLES TENDON REPAIR Right 1989  . COLONOSCOPY N/A last one 10-28-2010    due 10 yr f/u in 2022 - [facility]  . FINGER AMPUTATION  age 72   left [facility]  . NEGATIVE SLEEP STUDY  2010  per pt  . PENILE PROSTHESIS IMPLANT N/A 10/28/2014   Procedure: IMPLANTATION 3 PIECE PENILE INFLATABLE PROTHESIS/COLOPLAST SCROTAL APPROACH;  Surgeon: Kathie Rhodes, MD;  Location: [place];  Service: Urology;  Laterality: N/A;  . PERCUTANEOUS NEPHROLITHOTRIPSY Left 04-04-2009// second look 04-15-2009  . ROBOT ASSISTED LAPAROSCOPIC RADICAL PROSTATECTOMY  Dec 2008  . SHOULDER OPEN ROTATOR CUFF REPAIR Right 05-11-2006  . URETEROLITHOTOMY  2002   Family History  Problem Relation Age of Onset  . Heart disease Mother   . Diabetes Mother   . Arthritis Mother   . Alcohol abuse Father   . Hypertension Father    Social History   Socioeconomic History  . Marital status: Married    Spouse name: Vaughan Basta  . Number of children: Son & daughter  Social Needs  Occupational History  Tobacco Use  . Smoking status: Never Smoker  . Smokeless tobacco: Never Used  Substance and Sexual Activity  . Alcohol use: No  . Drug use: No  . Sexual activity: Not on file    ROS Constitutional: Denies fever, chills, weight loss/gain, headaches, insomnia,  night sweats or change in appetite. Does c/o fatigue. Eyes: Denies redness, blurred vision, diplopia, discharge, itchy or watery eyes.  ENT: Denies discharge, congestion, post nasal drip, epistaxis, sore throat, earache, hearing loss, dental pain, Tinnitus, Vertigo, Sinus pain or snoring.  Cardio: Denies chest pain, palpitations, irregular heartbeat, syncope, dyspnea, diaphoresis, orthopnea, PND, claudication or edema Respiratory: denies cough, dyspnea, DOE, pleurisy, hoarseness,  laryngitis or wheezing.  Gastrointestinal: Denies dysphagia, heartburn, reflux, water brash, pain, cramps, nausea, vomiting, bloating, diarrhea, constipation, hematemesis, melena, hematochezia, jaundice or hemorrhoids Genitourinary: Denies dysuria, frequency, urgency, nocturia, hesitancy, discharge, hematuria or flank pain Musculoskeletal: Denies arthralgia, myalgia, stiffness, Jt. Swelling, pain, limp or strain/sprain. Denies Falls. Skin: Denies puritis, rash, hives, warts, acne, eczema or change in skin lesion Neuro: No weakness, tremor, incoordination, spasms, paresthesia or pain Psychiatric: Denies confusion, memory loss or sensory loss. Denies Depression. Endocrine: Denies change in weight, skin, hair change, nocturia, and paresthesia, diabetic polys, visual blurring or hyper / hypo glycemic episodes.  Heme/Lymph: No excessive bleeding, bruising or enlarged lymph nodes.  Physical Exam  BP 114/84   Pulse (!) 56   Temp 97.7 F (36.5 C)   Resp 18   Ht 5'  5" (1.651 m)   Wt 194 lb 6.4 oz (88.2 kg)   BMI 32.35 kg/m   General Appearance: Over nourished and well groomed and in no apparent distress.  Eyes: PERRLA, EOMs, conjunctiva no swelling or erythema, normal fundi and vessels. Sinuses: No frontal/maxillary tenderness ENT/Mouth: EACs patent / TMs  nl. Nares clear without erythema, swelling, mucoid exudates. Oral hygiene is good. No erythema, swelling, or exudate. Tongue normal, non-obstructing. Tonsils not swollen or erythematous. Hearing normal.  Neck: Supple, thyroid normal. No bruits, nodes or JVD. Respiratory: Respiratory effort normal.  BS equal and clear bilateral without rales, rhonci, wheezing or stridor. Cardio: Heart sounds are normal with regular rate and rhythm and no murmurs, rubs or gallops. Peripheral pulses are normal and equal bilaterally without edema. No aortic or femoral bruits. Chest: symmetric with normal excursions and percussion.  Abdomen: Soft, with Nl bowel  sounds. Nontender, no guarding, rebound, hernias, masses, or organomegaly.  Lymphatics: Non tender without lymphadenopathy.  Genitourinary: Deferred patient request. S/p Prostatectomy Musculoskeletal: Full ROM all peripheral extremities, joint stability, 5/5 strength, and normal gait. Skin: Warm and dry without rashes, lesions, cyanosis, clubbing or  ecchymosis.  Neuro: Cranial nerves intact, reflexes equal bilaterally. Normal muscle tone, no cerebellar symptoms. Sensation intact to touch, vibratory and Monofilament to the toes bilaterally.  Pysch: Alert and oriented X 3 with normal affect, insight and judgment appropriate.   Assessment and Plan       Patient was counseled in prudent diet, weight control to achieve/maintain BMI less than 25, BP monitoring, regular exercise and medications as discussed.  Discussed med effects and SE's. Routine screening labs and tests as requested with regular follow-up as recommended. Over 40 minutes of exam, counseling, chart review and high complex critical decision making was performed

## 2017-03-22 ENCOUNTER — Other Ambulatory Visit: Payer: Self-pay | Admitting: Internal Medicine

## 2017-03-22 DIAGNOSIS — N390 Urinary tract infection, site not specified: Secondary | ICD-10-CM | POA: Diagnosis not present

## 2017-03-22 LAB — BASIC METABOLIC PANEL WITH GFR
BUN/Creatinine Ratio: 11 (calc) (ref 6–22)
BUN: 14 mg/dL (ref 7–25)
CALCIUM: 9.6 mg/dL (ref 8.6–10.3)
CHLORIDE: 105 mmol/L (ref 98–110)
CO2: 29 mmol/L (ref 20–32)
Creat: 1.33 mg/dL — ABNORMAL HIGH (ref 0.70–1.25)
GFR, Est African American: 63 mL/min/{1.73_m2} (ref >=60)
GFR, Est Non African American: 55 mL/min/{1.73_m2} — ABNORMAL LOW (ref >=60)
Glucose, Bld: 85 mg/dL (ref 65–99)
POTASSIUM: 4.1 mmol/L (ref 3.5–5.3)
SODIUM: 139 mmol/L (ref 135–146)

## 2017-03-22 LAB — LIPID PANEL
Cholesterol: 154 mg/dL (ref <200)
HDL: 32 mg/dL — AB (ref >40)
LDL Cholesterol (Calc): 100 mg/dL (calc) — ABNORMAL HIGH
NON-HDL CHOLESTEROL (CALC): 122 mg/dL (ref <130)
TRIGLYCERIDES: 122 mg/dL (ref <150)
Total CHOL/HDL Ratio: 4.8 (calc) (ref <5.0)

## 2017-03-22 LAB — CBC WITH DIFFERENTIAL/PLATELET
BASOS PCT: 0.5 %
Basophils Absolute: 40 cells/uL (ref 0–200)
EOS ABS: 152 {cells}/uL (ref 15–500)
Eosinophils Relative: 1.9 %
HCT: 44.6 % (ref 38.5–50.0)
Hemoglobin: 15 g/dL (ref 13.2–17.1)
Lymphs Abs: 3512 cells/uL (ref 850–3900)
MCH: 29.5 pg (ref 27.0–33.0)
MCHC: 33.6 g/dL (ref 32.0–36.0)
MCV: 87.6 fL (ref 80.0–100.0)
MONOS PCT: 5.4 %
MPV: 9.6 fL (ref 7.5–12.5)
NEUTROS PCT: 48.3 %
Neutro Abs: 3864 cells/uL (ref 1500–7800)
PLATELETS: 267 10*3/uL (ref 140–400)
RBC: 5.09 10*6/uL (ref 4.20–5.80)
RDW: 13.3 % (ref 11.0–15.0)
TOTAL LYMPHOCYTE: 43.9 %
WBC: 8 10*3/uL (ref 3.8–10.8)
WBCMIX: 432 {cells}/uL (ref 200–950)

## 2017-03-22 LAB — URINALYSIS, ROUTINE W REFLEX MICROSCOPIC
BILIRUBIN URINE: NEGATIVE
Bacteria, UA: NONE SEEN /HPF
GLUCOSE, UA: NEGATIVE
Hyaline Cast: NONE SEEN /LPF
Ketones, ur: NEGATIVE
NITRITE: NEGATIVE
PROTEIN: NEGATIVE
Specific Gravity, Urine: 1.019 (ref 1.001–1.03)
Squamous Epithelial / LPF: NONE SEEN /HPF (ref <=5)
pH: 5.5 (ref 5.0–8.0)

## 2017-03-22 LAB — HEPATIC FUNCTION PANEL
AG Ratio: 1.4 (calc) (ref 1.0–2.5)
ALBUMIN MSPROF: 4.4 g/dL (ref 3.6–5.1)
ALKALINE PHOSPHATASE (APISO): 73 U/L (ref 40–115)
ALT: 37 U/L (ref 9–46)
AST: 40 U/L — AB (ref 10–35)
BILIRUBIN DIRECT: 0.1 mg/dL (ref 0.0–0.2)
BILIRUBIN TOTAL: 0.6 mg/dL (ref 0.2–1.2)
Globulin: 3.2 g/dL (calc) (ref 1.9–3.7)
Indirect Bilirubin: 0.5 mg/dL (calc) (ref 0.2–1.2)
Total Protein: 7.6 g/dL (ref 6.1–8.1)

## 2017-03-22 LAB — HEMOGLOBIN A1C
EAG (MMOL/L): 7.3 (calc)
Hgb A1c MFr Bld: 6.2 % of total Hgb — ABNORMAL HIGH (ref <5.7)
MEAN PLASMA GLUCOSE: 131 (calc)

## 2017-03-22 LAB — MICROALBUMIN / CREATININE URINE RATIO
Creatinine, Urine: 161 mg/dL (ref 20–320)
MICROALB/CREAT RATIO: 36 ug/mg{creat} — AB (ref <30)
Microalb, Ur: 5.8 mg/dL

## 2017-03-22 LAB — TSH: TSH: 1.11 m[IU]/L (ref 0.40–4.50)

## 2017-03-22 LAB — PSA

## 2017-03-22 LAB — MAGNESIUM: MAGNESIUM: 1.8 mg/dL (ref 1.5–2.5)

## 2017-03-22 LAB — INSULIN, RANDOM: INSULIN: 8.2 u[IU]/mL (ref 2.0–19.6)

## 2017-03-22 LAB — VITAMIN D 25 HYDROXY (VIT D DEFICIENCY, FRACTURES): Vit D, 25-Hydroxy: 51 ng/mL (ref 30–100)

## 2017-03-22 LAB — URIC ACID: URIC ACID, SERUM: 5.1 mg/dL (ref 4.0–8.0)

## 2017-03-22 MED ORDER — CIPROFLOXACIN HCL 500 MG PO TABS: ORAL_TABLET | ORAL | 0 refills | Status: DC

## 2017-03-22 NOTE — Addendum Note (Signed)
Addended by: Eulis Canner on: 03/22/2017 01:50 PM   Modules accepted: Orders

## 2017-03-23 LAB — URINE CULTURE
MICRO NUMBER: 81362963
RESULT: NO GROWTH
SPECIMEN QUALITY:: ADEQUATE

## 2017-05-25 ENCOUNTER — Other Ambulatory Visit: Payer: Self-pay | Admitting: *Deleted

## 2017-05-25 MED ORDER — VALSARTAN 320 MG PO TABS: 320.0000 mg | ORAL_TABLET | Freq: Every day | ORAL | 3 refills | Status: DC

## 2017-05-25 MED ORDER — ALLOPURINOL 300 MG PO TABS: 300.0000 mg | ORAL_TABLET | Freq: Every day | ORAL | 3 refills | Status: DC

## 2017-06-30 DIAGNOSIS — E669 Obesity, unspecified: Secondary | ICD-10-CM | POA: Insufficient documentation

## 2017-06-30 DIAGNOSIS — E1122 Type 2 diabetes mellitus with diabetic chronic kidney disease: Secondary | ICD-10-CM | POA: Insufficient documentation

## 2017-06-30 DIAGNOSIS — N182 Chronic kidney disease, stage 2 (mild): Secondary | ICD-10-CM

## 2017-06-30 NOTE — Progress Notes (Signed)
FOLLOW UP  Assessment and Plan:   Hypertension Well controlled with current medications  Monitor blood pressure at home; patient to call if consistently greater than 130/80 Continue DASH diet.   Reminder to go to the ER if any CP, SOB, nausea, dizziness, severe HA, changes vision/speech, left arm numbness and tingling and jaw pain.  Cholesterol Currently above goal on zetia; hx of statin intolerance -  Continue low cholesterol diet and exercise.  Check lipid panel.   Diabetes with diabetic chronic kidney disease Continue medication: metformin Continue diet and exercise.  Perform daily foot/skin check, notify office of any concerning changes.  Check A1C  CKD 2 Increase fluids, avoid NSAIDS, monitor sugars, will monitor Check BMP/GFR  Obesity with co morbidities Long discussion about weight loss, diet, and exercise Recommended diet heavy in fruits and veggies and low in animal meats, cheeses, and dairy products, appropriate calorie intake Discussed ideal weight for height  Will follow up in 3 months  Vitamin D Def Below goal at last visit; has  increased supplement, continue supplementation for goal of 70-100 Defer Vit D level  Depression Initiate low dose celexa; discussed risks and SE  Lifestyle discussed: diet/exerise, sleep hygiene, stress management, hydration  Arthritis of first MTP joint Voltaren prescribed; patient declines xray at this time   Continue diet and meds as discussed. Further disposition pending results of labs. Discussed med's effects and SE's.   Over 30 minutes of exam, counseling, chart review, and critical decision making was performed.   Future Appointments  Date Time Provider [place]  10/03/2017  9:30 AM [name], MD [facility] None  04/14/2018 11:00 AM [name], MD [facility] None    ----------------------------------------------------------------------------------------------------------------------  HPI 67  y.o. male  presents for 3 month follow up on hypertension, cholesterol, diabetes, CKD2, obesity and vitamin D deficiency. He describes ongoing depressive symptoms related to his wife's declining health today, as well as difficulty sleeping. He also reports ongoing stiffness/achy pain of right first MTP joint - reports he has not tried anything for this intermittent pain.   BMI is Body mass index is 33.45 kg/m., he has been working on diet and exercise. Wt Readings from Last 3 Encounters:  07/01/17 201 lb (91.2 kg)  03/21/17 194 lb 6.4 oz (88.2 kg)  10/19/16 196 lb 3.2 oz (89 kg)   His blood pressure has been controlled at home, today their BP is BP: 120/78  He does workout. He denies chest pain, shortness of breath, dizziness.   He is on cholesterol medication (zetia 10 mg daily, hx of statin intolerance) and denies myalgias. His cholesterol is not at goal. The cholesterol last visit was:   Lab Results  Component Value Date   CHOL 154 03/21/2017   HDL 32 (L) 03/21/2017   LDLCALC 100 (H) 03/21/2017   TRIG 122 03/21/2017   CHOLHDL 4.8 03/21/2017    He has been working on diet and exercise for T2DM on metformin, and denies foot ulcerations, hyperglycemia, hypoglycemia , increased appetite, nausea, paresthesia of the feet, polydipsia, polyuria, visual disturbances, vomiting and weight loss. He does check fasting sugars - average is 100. Last A1C in the office was:  Lab Results  Component Value Date   HGBA1C 6.2 (H) 03/21/2017   Patient is on Vitamin D supplement but remained below goal of 70 at recent check:    Lab Results  Component Value Date   VD25OH 51 03/21/2017    He did increase his dose after last visit -  Current Medications:  Current Outpatient Medications on File Prior to Visit  Medication Sig  . allopurinol (ZYLOPRIM) 300 MG tablet Take 1 tablet (300 mg total) by mouth daily.  Marland Kitchen aspirin 81 MG tablet Take 81 mg by mouth daily.    . bisoprolol-hydrochlorothiazide (ZIAC)  5-6.25 MG tablet Take 1 tablet daily by mouth. for blood pressure  . Cholecalciferol (VITAMIN D3) 2000 UNITS capsule Take 6,000 Units by mouth daily.  Marland Kitchen ezetimibe (ZETIA) 10 MG tablet TAKE 1 TABLET DAILY  . fluticasone (FLONASE) 50 MCG/ACT nasal spray Place 1 spray into both nostrils daily as needed for allergies.   . metFORMIN (GLUCOPHAGE-XR) 500 MG 24 hr tablet TAKE 2 TABLETS TWICE A DAY WITH MEALS AS DIRECTED  . Multiple Vitamins-Minerals (MULTIVITAMIN WITH MINERALS) tablet Take 1 tablet by mouth daily.    . Omega-3 Fatty Acids (FISH OIL) 1000 MG CAPS Take 2 capsules by mouth daily.  . Wheat Dextrin (BENEFIBER DRINK MIX PO) Take 1 Package by mouth daily.   . ciprofloxacin (CIPRO) 500 MG tablet Take 1 tablet 2 x/ day with food for prostate infection   No current facility-administered medications on file prior to visit.      Allergies:  Allergies  Allergen Reactions  . Cialis [Tadalafil] Other (See Comments)    Hot flashes  . Fructose Nausea And Vomiting  . Morphine And Related Itching     Medical History:  Past Medical History:  Diagnosis Date  . ED (erectile dysfunction) of organic origin   . History of colon polyps   . History of kidney stones   . History of positive PPD    06/ 2006--  CXR NORMAL  . History of prostate cancer    DEC 2008--  S/P  RADICAL PROSTATECTOMY  . Hyperlipidemia   . Hypertension   . Hypogonadism male   . Mixed hyperlipidemia   . Nocturia   . Sigmoid diverticulosis    MILD  . Type 2 diabetes mellitus ([name])    Family history- Reviewed and unchanged Social history- Reviewed and unchanged   Review of Systems:  Review of Systems  Constitutional: Negative for malaise/fatigue and weight loss.  HENT: Negative for hearing loss and tinnitus.   Eyes: Negative for blurred vision and double vision.  Respiratory: Negative for cough, shortness of breath and wheezing.   Cardiovascular: Negative for chest pain, palpitations, orthopnea, claudication and  leg swelling.  Gastrointestinal: Negative for abdominal pain, blood in stool, constipation, diarrhea, heartburn, melena, nausea and vomiting.  Genitourinary: Negative.   Musculoskeletal: Negative for joint pain and myalgias.  Skin: Negative for rash.  Neurological: Negative for dizziness, tingling, sensory change, weakness and headaches.  Endo/Heme/Allergies: Negative for polydipsia.  Psychiatric/Behavioral: Positive for depression. Negative for memory loss, substance abuse and suicidal ideas. The patient has insomnia. The patient is not nervous/anxious.   All other systems reviewed and are negative.     Physical Exam: BP 120/78   Pulse 61   Temp 97.9 F (36.6 C)   Ht 5\' 5"  (1.651 m)   Wt 201 lb (91.2 kg)   SpO2 97%   BMI 33.45 kg/m  Wt Readings from Last 3 Encounters:  07/01/17 201 lb (91.2 kg)  03/21/17 194 lb 6.4 oz (88.2 kg)  10/19/16 196 lb 3.2 oz (89 kg)   General Appearance: Well nourished, in no apparent distress. Eyes: PERRLA, EOMs, conjunctiva no swelling or erythema Sinuses: No Frontal/maxillary tenderness ENT/Mouth: Ext aud canals clear, TMs without erythema, bulging. No erythema, swelling, or exudate on  post pharynx.  Tonsils not swollen or erythematous. Hearing normal.  Neck: Supple, thyroid normal.  Respiratory: Respiratory effort normal, BS equal bilaterally without rales, rhonchi, wheezing or stridor.  Cardio: RRR with no MRGs. Brisk peripheral pulses without edema.  Abdomen: Soft, + BS.  Non tender, no guarding, rebound, hernias, masses. Lymphatics: Non tender without lymphadenopathy.  Musculoskeletal: Full ROM, 5/5 strength, Normal gait. No bony abnormality, effusion/swelling  Skin: Warm, dry without rashes, lesions, ecchymosis.  Neuro: Cranial nerves intact. No cerebellar symptoms.  Psych: Awake and oriented X 3, normal affect, Insight and Judgment appropriate.    Izora Ribas, NP 8:56 AM [facility]

## 2017-07-01 ENCOUNTER — Encounter: Payer: Self-pay | Admitting: Adult Health

## 2017-07-01 ENCOUNTER — Ambulatory Visit (INDEPENDENT_AMBULATORY_CARE_PROVIDER_SITE_OTHER): Payer: Medicare Other | Admitting: Adult Health

## 2017-07-01 VITALS — BP 120/78 | HR 61 | Temp 97.9°F | Ht 65.0 in | Wt 201.0 lb

## 2017-07-01 DIAGNOSIS — E559 Vitamin D deficiency, unspecified: Secondary | ICD-10-CM

## 2017-07-01 DIAGNOSIS — N182 Chronic kidney disease, stage 2 (mild): Secondary | ICD-10-CM

## 2017-07-01 DIAGNOSIS — E669 Obesity, unspecified: Secondary | ICD-10-CM | POA: Diagnosis not present

## 2017-07-01 DIAGNOSIS — I10 Essential (primary) hypertension: Secondary | ICD-10-CM

## 2017-07-01 DIAGNOSIS — F32A Depression, unspecified: Secondary | ICD-10-CM

## 2017-07-01 DIAGNOSIS — F329 Major depressive disorder, single episode, unspecified: Secondary | ICD-10-CM

## 2017-07-01 DIAGNOSIS — Z79899 Other long term (current) drug therapy: Secondary | ICD-10-CM | POA: Diagnosis not present

## 2017-07-01 DIAGNOSIS — M19079 Primary osteoarthritis, unspecified ankle and foot: Secondary | ICD-10-CM | POA: Diagnosis not present

## 2017-07-01 DIAGNOSIS — E1122 Type 2 diabetes mellitus with diabetic chronic kidney disease: Secondary | ICD-10-CM

## 2017-07-01 DIAGNOSIS — F325 Major depressive disorder, single episode, in full remission: Secondary | ICD-10-CM | POA: Insufficient documentation

## 2017-07-01 DIAGNOSIS — E782 Mixed hyperlipidemia: Secondary | ICD-10-CM | POA: Diagnosis not present

## 2017-07-01 MED ORDER — DICLOFENAC SODIUM 1 % TD GEL: 4.0000 g | Freq: Four times a day (QID) | TRANSDERMAL | 3 refills | Status: DC

## 2017-07-01 MED ORDER — VALSARTAN 320 MG PO TABS: 320.0000 mg | ORAL_TABLET | Freq: Every day | ORAL | 3 refills | Status: DC

## 2017-07-01 MED ORDER — CITALOPRAM HYDROBROMIDE 20 MG PO TABS: 20.0000 mg | ORAL_TABLET | Freq: Every day | ORAL | 1 refills | Status: DC

## 2017-07-01 NOTE — Patient Instructions (Signed)
Citalopram tablets What is this medicine? CITALOPRAM (sye TAL oh pram) is a medicine for depression. This medicine may be used for other purposes; ask your health care provider or pharmacist if you have questions. COMMON BRAND NAME(S): Celexa What should I tell my health care provider before I take this medicine? They need to know if you have any of these conditions: -bleeding disorders -bipolar disorder or a family history of bipolar disorder -glaucoma -heart disease -history of irregular heartbeat -kidney disease -liver disease -low levels of magnesium or potassium in the blood -receiving electroconvulsive therapy -seizures -suicidal thoughts, plans, or attempt; a previous suicide attempt by you or a family member -take medicines that treat or prevent blood clots -thyroid disease -an unusual or allergic reaction to citalopram, escitalopram, other medicines, foods, dyes, or preservatives -pregnant or trying to become pregnant -breast-feeding How should I use this medicine? Take this medicine by mouth with a glass of water. Follow the directions on the prescription label. You can take it with or without food. Take your medicine at regular intervals. Do not take your medicine more often than directed. Do not stop taking this medicine suddenly except upon the advice of your doctor. Stopping this medicine too quickly may cause serious side effects or your condition may worsen. A special MedGuide will be given to you by the pharmacist with each prescription and refill. Be sure to read this information carefully each time. Talk to your pediatrician regarding the use of this medicine in children. Special care may be needed. Patients over 69 years old may have a stronger reaction and need a smaller dose. Overdosage: If you think you have taken too much of this medicine contact a poison control center or emergency room at once. NOTE: This medicine is only for you. Do not share this medicine with  others. What if I miss a dose? If you miss a dose, take it as soon as you can. If it is almost time for your next dose, take only that dose. Do not take double or extra doses. What may interact with this medicine? Do not take this medicine with any of the following medications: -certain medicines for fungal infections like fluconazole, itraconazole, ketoconazole, posaconazole, voriconazole -cisapride -dofetilide -dronedarone -escitalopram -linezolid -MAOIs like Carbex, Eldepryl, Marplan, Nardil, and Parnate -methylene blue (injected into a vein) -pimozide -thioridazine -ziprasidone This medicine may also interact with the following medications: -alcohol -amphetamines -aspirin and aspirin-like medicines -carbamazepine -certain medicines for depression, anxiety, or psychotic disturbances -certain medicines for infections like chloroquine, clarithromycin, erythromycin, furazolidone, isoniazid, pentamidine -certain medicines for migraine headaches like almotriptan, eletriptan, frovatriptan, naratriptan, rizatriptan, sumatriptan, zolmitriptan -certain medicines for sleep -certain medicines that treat or prevent blood clots like dalteparin, enoxaparin, warfarin -cimetidine -diuretics -fentanyl -lithium -methadone -metoprolol -NSAIDs, medicines for pain and inflammation, like ibuprofen or naproxen -omeprazole -other medicines that prolong the QT interval (cause an abnormal heart rhythm) -procarbazine -rasagiline -supplements like St. John's wort, kava kava, valerian -tramadol -tryptophan This list may not describe all possible interactions. Give your health care provider a list of all the medicines, herbs, non-prescription drugs, or dietary supplements you use. Also tell them if you smoke, drink alcohol, or use illegal drugs. Some items may interact with your medicine. What should I watch for while using this medicine? Tell your doctor if your symptoms do not get better or if they  get worse. Visit your doctor or health care professional for regular checks on your progress. Because it may take several weeks to see the full   effects of this medicine, it is important to continue your treatment as prescribed by your doctor. Patients and their families should watch out for new or worsening thoughts of suicide or depression. Also watch out for sudden changes in feelings such as feeling anxious, agitated, panicky, irritable, hostile, aggressive, impulsive, severely restless, overly excited and hyperactive, or not being able to sleep. If this happens, especially at the beginning of treatment or after a change in dose, call your health care professional. You may get drowsy or dizzy. Do not drive, use machinery, or do anything that needs mental alertness until you know how this medicine affects you. Do not stand or sit up quickly, especially if you are an older patient. This reduces the risk of dizzy or fainting spells. Alcohol may interfere with the effect of this medicine. Avoid alcoholic drinks. Your mouth may get dry. Chewing sugarless gum or sucking hard candy, and drinking plenty of water will help. Contact your doctor if the problem does not go away or is severe. What side effects may I notice from receiving this medicine? Side effects that you should report to your doctor or health care professional as soon as possible: -allergic reactions like skin rash, itching or hives, swelling of the face, lips, or tongue -anxious -black, tarry stools -breathing problems -changes in vision -chest pain -confusion -elevated mood, decreased need for sleep, racing thoughts, impulsive behavior -eye pain -fast, irregular heartbeat -feeling faint or lightheaded, falls -feeling agitated, angry, or irritable -hallucination, loss of contact with reality -loss of balance or coordination -loss of memory -painful or prolonged erections -restlessness, pacing, inability to keep  still -seizures -stiff muscles -suicidal thoughts or other mood changes -trouble sleeping -unusual bleeding or bruising -unusually weak or tired -vomiting Side effects that usually do not require medical attention (report to your doctor or health care professional if they continue or are bothersome): -change in appetite or weight -change in sex drive or performance -dizziness -headache -increased sweating -indigestion, nausea -tremors This list may not describe all possible side effects. Call your doctor for medical advice about side effects. You may report side effects to FDA at 1-800-FDA-1088. Where should I keep my medicine? Keep out of reach of children. Store at room temperature between 15 and 30 degrees C (59 and 86 degrees F). Throw away any unused medicine after the expiration date. NOTE: This sheet is a summary. It may not cover all possible information. If you have questions about this medicine, talk to your doctor, pharmacist, or health care provider.  2018 Elsevier/Gold Standard (2015-09-08 13:18:52)  

## 2017-07-02 LAB — LIPID PANEL
CHOL/HDL RATIO: 5.5 (calc) — AB (ref <5.0)
CHOLESTEROL: 153 mg/dL (ref <200)
HDL: 28 mg/dL — ABNORMAL LOW (ref >40)
LDL CHOLESTEROL (CALC): 92 mg/dL
Non-HDL Cholesterol (Calc): 125 mg/dL (calc) (ref <130)
Triglycerides: 240 mg/dL — ABNORMAL HIGH (ref <150)

## 2017-07-02 LAB — CBC WITH DIFFERENTIAL/PLATELET
Basophils Absolute: 43 cells/uL (ref 0–200)
Basophils Relative: 0.5 %
EOS PCT: 2.5 %
Eosinophils Absolute: 213 cells/uL (ref 15–500)
HCT: 44.5 % (ref 38.5–50.0)
Hemoglobin: 15.1 g/dL (ref 13.2–17.1)
LYMPHS ABS: 3919 {cells}/uL — AB (ref 850–3900)
MCH: 29.5 pg (ref 27.0–33.0)
MCHC: 33.9 g/dL (ref 32.0–36.0)
MCV: 87.1 fL (ref 80.0–100.0)
MPV: 9.9 fL (ref 7.5–12.5)
Monocytes Relative: 6.1 %
NEUTROS PCT: 44.8 %
Neutro Abs: 3808 cells/uL (ref 1500–7800)
PLATELETS: 268 10*3/uL (ref 140–400)
RBC: 5.11 10*6/uL (ref 4.20–5.80)
RDW: 13.5 % (ref 11.0–15.0)
TOTAL LYMPHOCYTE: 46.1 %
WBC mixed population: 519 cells/uL (ref 200–950)
WBC: 8.5 10*3/uL (ref 3.8–10.8)

## 2017-07-02 LAB — HEPATIC FUNCTION PANEL
AG RATIO: 1.5 (calc) (ref 1.0–2.5)
ALBUMIN MSPROF: 4.4 g/dL (ref 3.6–5.1)
ALT: 25 U/L (ref 9–46)
AST: 24 U/L (ref 10–35)
Alkaline phosphatase (APISO): 73 U/L (ref 40–115)
BILIRUBIN DIRECT: 0.1 mg/dL (ref 0.0–0.2)
BILIRUBIN INDIRECT: 0.2 mg/dL (ref 0.2–1.2)
BILIRUBIN TOTAL: 0.3 mg/dL (ref 0.2–1.2)
GLOBULIN: 2.9 g/dL (ref 1.9–3.7)
Total Protein: 7.3 g/dL (ref 6.1–8.1)

## 2017-07-02 LAB — BASIC METABOLIC PANEL WITH GFR
BUN / CREAT RATIO: 15 (calc) (ref 6–22)
BUN: 20 mg/dL (ref 7–25)
CALCIUM: 9.7 mg/dL (ref 8.6–10.3)
CO2: 27 mmol/L (ref 20–32)
CREATININE: 1.35 mg/dL — AB (ref 0.70–1.25)
Chloride: 104 mmol/L (ref 98–110)
GFR, EST AFRICAN AMERICAN: 62 mL/min/{1.73_m2} (ref >=60)
GFR, EST NON AFRICAN AMERICAN: 54 mL/min/{1.73_m2} — AB (ref >=60)
Glucose, Bld: 133 mg/dL — ABNORMAL HIGH (ref 65–99)
Potassium: 4.2 mmol/L (ref 3.5–5.3)
Sodium: 138 mmol/L (ref 135–146)

## 2017-07-02 LAB — HEMOGLOBIN A1C
EAG (MMOL/L): 7.4 (calc)
Hgb A1c MFr Bld: 6.3 % of total Hgb — ABNORMAL HIGH (ref <5.7)
MEAN PLASMA GLUCOSE: 134 (calc)

## 2017-07-02 LAB — TSH: TSH: 1.47 mIU/L (ref 0.40–4.50)

## 2017-08-17 ENCOUNTER — Other Ambulatory Visit: Payer: Self-pay | Admitting: Internal Medicine

## 2017-08-17 DIAGNOSIS — I10 Essential (primary) hypertension: Secondary | ICD-10-CM

## 2017-10-03 ENCOUNTER — Ambulatory Visit (INDEPENDENT_AMBULATORY_CARE_PROVIDER_SITE_OTHER): Payer: Medicare Other | Admitting: Internal Medicine

## 2017-10-03 ENCOUNTER — Encounter: Payer: Self-pay | Admitting: Internal Medicine

## 2017-10-03 VITALS — BP 130/74 | HR 67 | Temp 97.5°F | Resp 16 | Ht 64.5 in | Wt 196.4 lb

## 2017-10-03 DIAGNOSIS — E782 Mixed hyperlipidemia: Secondary | ICD-10-CM | POA: Diagnosis not present

## 2017-10-03 DIAGNOSIS — E1122 Type 2 diabetes mellitus with diabetic chronic kidney disease: Secondary | ICD-10-CM | POA: Diagnosis not present

## 2017-10-03 DIAGNOSIS — E559 Vitamin D deficiency, unspecified: Secondary | ICD-10-CM

## 2017-10-03 DIAGNOSIS — Z79899 Other long term (current) drug therapy: Secondary | ICD-10-CM

## 2017-10-03 DIAGNOSIS — I10 Essential (primary) hypertension: Secondary | ICD-10-CM

## 2017-10-03 DIAGNOSIS — M109 Gout, unspecified: Secondary | ICD-10-CM | POA: Diagnosis not present

## 2017-10-03 DIAGNOSIS — N182 Chronic kidney disease, stage 2 (mild): Secondary | ICD-10-CM

## 2017-10-03 NOTE — Progress Notes (Signed)
This very nice 67 y.o. Zamora presents for 6 month follow up with HTN, HLD, T2_DM and Vitamin D Deficiency.      Patient is treated for HTN (1996) & BP has been controlled at home. Today's BP is at goal - 130/74. Patient has had no complaints of any cardiac type chest pain, palpitations, dyspnea / orthopnea / PND, dizziness, claudication, or dependent edema.     Hyperlipidemia is controlled with diet & Zetia (Intol to Statins). Patient denies myalgias or other med SE's. 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  Last A1c was not at goal : Lab Results  Component Value Date   HGBA1C 6.3 (H) 07/01/2017      Further, the patient also has history of Vitamin D Deficiency and supplements vitamin D without any suspected side-effects. Last vitamin D was still low (70-100): Lab Results  Component Value Date   VD25OH [place] 03/21/2017   Current Outpatient Medications on File Prior to Visit  Medication Sig  . allopurinol (ZYLOPRIM) 300 MG tablet Take 1 tablet (300 mg total) by mouth daily.  Marland Kitchen aspirin 81 MG tablet Take 81 mg by mouth daily.    . Cholecalciferol (VITAMIN D3) 2000 UNITS capsule Take 8,000 Units by mouth daily.  . citalopram (CELEXA) 20 MG tablet Take 1 tablet (20 mg total) by mouth daily.  . diclofenac sodium (VOLTAREN) 1 % GEL Apply 4 g topically 4 (four) times daily.  Marland Kitchen ezetimibe (ZETIA) 10 MG tablet TAKE 1 TABLET DAILY  . fluticasone (FLONASE) 50 MCG/ACT nasal spray Place 1 spray into both nostrils daily as needed for allergies.   . metFORMIN (GLUCOPHAGE-XR) 500 MG 24 hr tablet TAKE 2 TABLETS TWICE A DAY WITH MEALS AS  DIRECTED  . Multiple Vitamins-Minerals (MULTIVITAMIN WITH MINERALS) tablet Take 1 tablet by mouth daily.    . Omega-3 Fatty Acids (FISH OIL) 1000 MG CAPS Take 2 capsules by mouth daily.  . valsartan (DIOVAN) 320 MG tablet Take 1 tablet (320 mg total) by mouth daily.  . Wheat Dextrin (BENEFIBER DRINK MIX PO) Take 1 Package by mouth daily.   Marland Kitchen ZIAC 5-6.25 MG tablet TAKE 1 TABLET DAILY FOR BLOOD PRESSURE   No current facility-administered medications on file prior to visit.    Allergies  Allergen Reactions  . Cialis [Tadalafil] Other (See Comments)    Hot flashes  . Fructose Nausea And Vomiting  . Morphine And Related Itching   PMHx:   Past Medical History:  Diagnosis Date  . ED (erectile dysfunction) of organic origin   . History of colon polyps   . History of kidney stones   . History of positive PPD    06/ 2006--  CXR NORMAL  . History of prostate cancer    DEC 2008--  S/P  RADICAL PROSTATECTOMY  . Hyperlipidemia   . Hypertension   . Hypogonadism male   . Mixed hyperlipidemia   . Nocturia   . Sigmoid diverticulosis    MILD  . Type 2 diabetes mellitus ([place])    Immunization History  Administered Date(s) Administered  .  DTaP 02/17/2002  . Influenza Whole 01/08/2013  . Influenza, High Dose Seasonal PF 02/26/2014, 12/24/2015, 03/21/2017  . Pneumococcal Conjugate-13 07/23/2015  . Pneumococcal Polysaccharide-23 02/18/1996  . Tdap 06/29/2011  . Zoster 03/23/2011   Past Surgical History:  Procedure Laterality Date  . ACHILLES TENDON REPAIR Right 1989  . COLONOSCOPY N/A last one 10-28-2010    due 10 yr f/u in 2022 - [facility]  . FINGER AMPUTATION  age 75   left [place]  . NEGATIVE SLEEP STUDY  2010  per pt  . PENILE PROSTHESIS IMPLANT N/A 10/28/2014   Procedure: IMPLANTATION 3 PIECE PENILE INFLATABLE PROTHESIS/COLOPLAST SCROTAL APPROACH;  Surgeon: Kathie Rhodes, MD;  Location: [facility];  Service: Urology;  Laterality: N/A;  . PERCUTANEOUS NEPHROLITHOTRIPSY Left  04-04-2009// second look 04-15-2009  . ROBOT ASSISTED LAPAROSCOPIC RADICAL PROSTATECTOMY  Dec 2008  . SHOULDER OPEN ROTATOR CUFF REPAIR Right 05-11-2006  . URETEROLITHOTOMY  2002   FHx:    Reviewed / unchanged  SHx:    Reviewed / unchanged  Systems Review:  Constitutional: Denies fever, chills, wt changes, headaches, insomnia, fatigue, night sweats, change in appetite. Eyes: Denies redness, blurred vision, diplopia, discharge, itchy, watery eyes.  ENT: Denies discharge, congestion, post nasal drip, epistaxis, sore throat, earache, hearing loss, dental pain, tinnitus, vertigo, sinus pain, snoring.  CV: Denies chest pain, palpitations, irregular heartbeat, syncope, dyspnea, diaphoresis, orthopnea, PND, claudication or edema. Respiratory: denies cough, dyspnea, DOE, pleurisy, hoarseness, laryngitis, wheezing.  Gastrointestinal: Denies dysphagia, odynophagia, heartburn, reflux, water brash, abdominal pain or cramps, nausea, vomiting, bloating, diarrhea, constipation, hematemesis, melena, hematochezia  or hemorrhoids. Genitourinary: Denies dysuria, frequency, urgency, nocturia, hesitancy, discharge, hematuria or flank pain. Musculoskeletal: Denies arthralgias, myalgias, stiffness, jt. swelling, pain, limping or strain/sprain.  Skin: Denies pruritus, rash, hives, warts, acne, eczema or change in skin lesion(s). Neuro: No weakness, tremor, incoordination, spasms, paresthesia or pain. Psychiatric: Denies confusion, memory loss or sensory loss. Endo: Denies change in weight, skin or hair change.  Heme/Lymph: No excessive bleeding, bruising or enlarged lymph nodes.  Physical Exam  BP 130/74   Pulse 67   Temp (!) 97.5 F (36.4 C)   Resp 16   Ht 5' 4.5" (1.638 m)   Wt 196 lb 6.4 oz (89.1 kg)   SpO2 98%   BMI 33.19 kg/m   Appears  well nourished, well groomed  and in no distress.  Eyes: PERRLA, EOMs, conjunctiva no swelling or erythema. Sinuses: No frontal/maxillary tenderness ENT/Mouth:  EAC's clear, TM's nl w/o erythema, bulging. Nares clear w/o erythema, swelling, exudates. Oropharynx clear without erythema or exudates. Oral hygiene is good. Tongue normal, non obstructing. Hearing intact.  Neck: Supple. Thyroid not palpable. Car 2+/2+ without bruits, nodes or JVD. Chest: Respirations nl with BS clear & equal w/o rales, rhonchi, wheezing or stridor.  Cor: Heart sounds normal w/ regular rate and rhythm without sig. murmurs, gallops, clicks or rubs. Peripheral pulses normal and equal  without edema.  Abdomen: Soft & bowel sounds normal. Non-tender w/o guarding, rebound, hernias, masses or organomegaly.  Lymphatics: Unremarkable.  Musculoskeletal: Full ROM all peripheral extremities, joint stability, 5/5 strength and normal gait.  Skin: Warm, dry without exposed rashes, lesions or ecchymosis apparent.  Neuro: Cranial nerves intact, reflexes equal bilaterally. Sensory-motor testing grossly intact. Tendon reflexes grossly intact.  Pysch: Alert & oriented x 3.  Insight and judgement nl & appropriate. No ideations.  Assessment and Plan:  1. Essential hypertension  - Continue medication, monitor blood pressure at home.  - Continue DASH diet.  Reminder to go to the ER if any CP,  SOB, nausea, dizziness, severe HA, changes vision/speech.  - CBC with Differential/Platelet - COMPLETE METABOLIC PANEL WITH GFR - Magnesium - TSH  2. Hyperlipidemia, mixed  - Continue diet/meds, exercise,& lifestyle modifications.  - Continue monitor periodic cholesterol/liver & renal functions   - Lipid panel - TSH  3. Type 2 diabetes mellitus with stage 2 chronic kidney disease, without long-term current use of insulin ([name])  - Continue diet, exercise, lifestyle modifications.  - Monitor appropriate labs.  - Hemoglobin A1c - Insulin, random  4. Vitamin D deficiency  - Continue supplementation.  - VITAMIN D 25 Hydroxyl  5. Gout, hx  - Uric acid  6. Medication management  - CBC  with Differential/Platelet - COMPLETE METABOLIC PANEL WITH GFR - Magnesium - Lipid panel - TSH - Hemoglobin A1c - Insulin, random - VITAMIN D 25 Hydroxyl - Uric acid      Discussed  regular exercise, BP monitoring, weight control to achieve/maintain BMI less than 25 and discussed med and SE's. Recommended labs to assess and monitor clinical status with further disposition pending results of labs. Over 30 minutes of exam, counseling, chart review was performed.

## 2017-10-03 NOTE — Patient Instructions (Signed)

## 2017-12-30 ENCOUNTER — Encounter: Payer: Self-pay | Admitting: *Deleted

## 2018-01-03 ENCOUNTER — Other Ambulatory Visit: Payer: Self-pay

## 2018-01-03 ENCOUNTER — Encounter: Payer: Self-pay | Admitting: Physician Assistant

## 2018-01-03 ENCOUNTER — Ambulatory Visit (INDEPENDENT_AMBULATORY_CARE_PROVIDER_SITE_OTHER): Payer: Medicare Other | Admitting: Physician Assistant

## 2018-01-03 VITALS — BP 124/68 | HR 62 | Temp 97.7°F | Resp 16 | Ht 64.5 in | Wt 197.4 lb

## 2018-01-03 DIAGNOSIS — Z0001 Encounter for general adult medical examination with abnormal findings: Secondary | ICD-10-CM

## 2018-01-03 DIAGNOSIS — R6889 Other general symptoms and signs: Secondary | ICD-10-CM

## 2018-01-03 DIAGNOSIS — E782 Mixed hyperlipidemia: Secondary | ICD-10-CM

## 2018-01-03 DIAGNOSIS — N182 Chronic kidney disease, stage 2 (mild): Secondary | ICD-10-CM

## 2018-01-03 DIAGNOSIS — M19079 Primary osteoarthritis, unspecified ankle and foot: Secondary | ICD-10-CM

## 2018-01-03 DIAGNOSIS — I10 Essential (primary) hypertension: Secondary | ICD-10-CM

## 2018-01-03 DIAGNOSIS — E1122 Type 2 diabetes mellitus with diabetic chronic kidney disease: Secondary | ICD-10-CM

## 2018-01-03 DIAGNOSIS — M109 Gout, unspecified: Secondary | ICD-10-CM | POA: Diagnosis not present

## 2018-01-03 DIAGNOSIS — F329 Major depressive disorder, single episode, unspecified: Secondary | ICD-10-CM

## 2018-01-03 DIAGNOSIS — Z0000 Encounter for general adult medical examination without abnormal findings: Secondary | ICD-10-CM

## 2018-01-03 DIAGNOSIS — R7309 Other abnormal glucose: Secondary | ICD-10-CM

## 2018-01-03 DIAGNOSIS — E559 Vitamin D deficiency, unspecified: Secondary | ICD-10-CM

## 2018-01-03 DIAGNOSIS — F32A Depression, unspecified: Secondary | ICD-10-CM

## 2018-01-03 DIAGNOSIS — E669 Obesity, unspecified: Secondary | ICD-10-CM

## 2018-01-03 DIAGNOSIS — Z23 Encounter for immunization: Secondary | ICD-10-CM | POA: Diagnosis not present

## 2018-01-03 DIAGNOSIS — Z79899 Other long term (current) drug therapy: Secondary | ICD-10-CM

## 2018-01-03 MED ORDER — FLUTICASONE PROPIONATE 50 MCG/ACT NA SUSP: 1.0000 | Freq: Every day | NASAL | 1 refills | Status: AC | PRN

## 2018-01-03 MED ORDER — VALSARTAN 320 MG PO TABS: 320.0000 mg | ORAL_TABLET | Freq: Every day | ORAL | 3 refills | Status: DC

## 2018-01-03 MED ORDER — DICLOFENAC SODIUM 1 % TD GEL: 4.0000 g | Freq: Four times a day (QID) | TRANSDERMAL | 3 refills | Status: DC

## 2018-01-03 MED ORDER — ALLOPURINOL 300 MG PO TABS: 300.0000 mg | ORAL_TABLET | Freq: Every day | ORAL | 3 refills | Status: DC

## 2018-01-03 MED ORDER — CITALOPRAM HYDROBROMIDE 20 MG PO TABS: 20.0000 mg | ORAL_TABLET | Freq: Every day | ORAL | 1 refills | Status: DC

## 2018-01-03 MED ORDER — EZETIMIBE 10 MG PO TABS: 10.0000 mg | ORAL_TABLET | Freq: Every day | ORAL | 3 refills | Status: DC

## 2018-01-03 MED ORDER — BISOPROLOL-HYDROCHLOROTHIAZIDE 5-6.25 MG PO TABS: 1.0000 | ORAL_TABLET | Freq: Every day | ORAL | 1 refills | Status: DC

## 2018-01-03 MED ORDER — METFORMIN HCL ER 500 MG PO TB24: ORAL_TABLET | ORAL | 3 refills | Status: DC

## 2018-01-03 NOTE — Progress Notes (Signed)
MEDICARE ANNUAL WELLNESS VISIT AND OV  Assessment:   Assessment and Plan:   Essential hypertension - continue medications, DASH diet, exercise and monitor at home. Call if greater than 130/80.  -     CBC with Differential/Platelet -     BASIC METABOLIC PANEL WITH GFR -     Hepatic function panel -     TSH  Type 2 diabetes mellitus with stage 2 chronic kidney disease, without long-term current use of insulin ([place]) Discussed general issues about diabetes pathophysiology and management., Educational material distributed., Suggested low cholesterol diet., Encouraged aerobic exercise., Discussed foot care., Reminded to get yearly retinal exam. -     Hemoglobin A1c  Nephrolithiasis Increase fluids  Malignant neoplasm of prostate ([place]) Continue follow up urology  Vitamin D deficiency  Mixed hyperlipidemia -continue medications, check lipids, decrease fatty foods, increase activity.  -     Lipid panel  Medication management -     Magnesium  Generalized anxiety disorder  continue medications, stress management techniques discussed, increase water, good sleep hygiene discussed, increase exercise, and increase veggies.   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 Further disposition pending results of labs. Discussed med's effects and SE's.    Over 30 minutes of exam, counseling, chart review, and critical decision making was performed  Future Appointments  Date Time Provider [place]  04/14/2018 11:00 AM [name], MD [facility] None     Plan:   During the course of the visit the patient was educated and counseled about appropriate screening and preventive services including:    Pneumococcal vaccine   Influenza vaccine  Td vaccine  Screening  electrocardiogram  Bone densitometry screening  Colorectal cancer screening  Diabetes screening  Glaucoma screening  Nutrition counseling   Advanced directives: requested   HPI 67 y.o.  AA male  presents for 3 month follow up on hypertension, cholesterol, diabetes and vitamin D deficiency and wellness visit. Marland Kitchen   His blood pressure has been controlled at home, today his BP is BP: 124/68.  He does not workout. He denies chest pain, shortness of breath, dizziness.  He is on cholesterol medication and denies myalgias. His cholesterol is at goal. The cholesterol was:   Lab Results  Component Value Date   CHOL 122 10/03/2017   HDL 29 (L) 10/03/2017   LDLCALC 68 10/03/2017   TRIG 177 (H) 10/03/2017   CHOLHDL 4.2 10/03/2017    He has been working on diet and exercise for diabetes with diabetic chronic kidney disease, he is on bASA, he is on ACE/ARB, and denies  paresthesia of the feet, polydipsia, polyuria and visual disturbances. Last A1C was:  Lab Results  Component Value Date   HGBA1C 6.3 (H) 10/03/2017   Lab Results  Component Value Date   GFRAA 71 10/03/2017    Patient is on Vitamin D supplement. Lab Results  Component Value Date   VD25OH 55 10/03/2017   Patient is on allopurinol for gout and does not report a recent flare.  Lab Results  Component Value Date   LABURIC 5.6 10/03/2017   BMI is Body mass index is 33.36 kg/m., he is working on diet and exercise. Wife, linda, got a kidney transplant.   Wt Readings from Last 3 Encounters:  01/03/18 197 lb 6.4 oz (89.5 kg)  10/03/17 196 lb 6.4  oz (89.1 kg)  07/01/17 201 lb (91.2 kg)   Current Medications:  Current Outpatient Medications on File Prior to Visit  Medication Sig Dispense Refill  . allopurinol (ZYLOPRIM) 300 MG tablet Take 1 tablet (300 mg total) by mouth daily. 90 tablet 3  . aspirin 81 MG tablet Take 81 mg by mouth daily.      . Cholecalciferol (VITAMIN D3) 2000 UNITS capsule Take 8,000 Units by mouth  daily.    . citalopram (CELEXA) 20 MG tablet Take 1 tablet (20 mg total) by mouth daily. 90 tablet 1  . diclofenac sodium (VOLTAREN) 1 % GEL Apply 4 g topically 4 (four) times daily. 100 g 3  . ezetimibe (ZETIA) 10 MG tablet TAKE 1 TABLET DAILY 90 tablet 3  . fluticasone (FLONASE) 50 MCG/ACT nasal spray Place 1 spray into both nostrils daily as needed for allergies.     . metFORMIN (GLUCOPHAGE-XR) 500 MG 24 hr tablet TAKE 2 TABLETS TWICE A DAY WITH MEALS AS DIRECTED 360 tablet 3  . Multiple Vitamins-Minerals (MULTIVITAMIN WITH MINERALS) tablet Take 1 tablet by mouth daily.      . Omega-3 Fatty Acids (FISH OIL) 1000 MG CAPS Take 2 capsules by mouth daily.    . valsartan (DIOVAN) 320 MG tablet Take 1 tablet (320 mg total) by mouth daily. 90 tablet 3  . Wheat Dextrin (BENEFIBER DRINK MIX PO) Take 1 Package by mouth daily.     Marland Kitchen ZIAC 5-6.25 MG tablet TAKE 1 TABLET DAILY FOR BLOOD PRESSURE 90 tablet 1   No current facility-administered medications on file prior to visit.    Medical History:  Past Medical History:  Diagnosis Date  . ED (erectile dysfunction) of organic origin   . History of colon polyps   . History of kidney stones   . History of positive PPD    06/ 2006--  CXR NORMAL  . History of prostate cancer    DEC 2008--  S/P  RADICAL PROSTATECTOMY  . Hyperlipidemia   . Hypertension   . Hypogonadism male   . Mixed hyperlipidemia   . Nocturia   . Sigmoid diverticulosis    MILD  . Type 2 diabetes mellitus ([place])    Preventative care: Immunization History  Administered Date(s) Administered  . DTaP 02/17/2002  . Influenza Whole 01/08/2013  . Influenza, High Dose Seasonal PF 02/26/2014, 12/24/2015, 03/21/2017  . Pneumococcal Conjugate-13 07/23/2015  . Pneumococcal Polysaccharide-23 02/18/1996, 01/03/2018  . Tdap 06/29/2011  . Zoster 03/23/2011    Last colonoscopy: 10/28/2010 - Dr Deatra Ina - 48 yr f/u.  EGD 2012 DEXA 2011 CXR 2013  Tdap 2013 Pneumonia 2019 Prevnar 13:  2017 Influenza 2018 Shingles 2012  Names of Other Physician/Practitioners you currently use: 1. [facility] here for primary care 2. Dr Frederico Hamman, eye doctor,  2 years 3. Dr Lara Mulch, dentist, last visit Aug 2019  Patient Care Team: [name], MD as PCP - General (Internal Medicine) Lowella Bandy, MD as Consulting Physician (Urology) Inda Castle, MD as Consulting Physician (Gastroenterology)   Allergies Allergies  Allergen Reactions  . Cialis [Tadalafil] Other (See Comments)    Hot flashes  . Fructose Nausea And Vomiting  . Morphine And Related Itching    SURGICAL HISTORY He  has a past surgical history that includes Finger amputation (age 50); Robot assisted laparoscopic radical prostatectomy (Dec 2008); Colonoscopy (N/A, last one 10-28-2010); Percutaneous nephrolithotripsy (Left, 04-04-2009// second look 04-15-2009); Ureterolithotomy (2002); Shoulder open rotator cuff repair (Right, 05-11-2006); Achilles tendon  repair (Right, 1989); NEGATIVE SLEEP STUDY (2010  per pt); and Penile prosthesis implant (N/A, 10/28/2014). FAMILY HISTORY His family history includes Alcohol abuse in his father; Arthritis in his mother; Diabetes in his mother; Heart disease in his mother; Hypertension in his father. SOCIAL HISTORY He  reports that he has never smoked. He has never used smokeless tobacco. He reports that he does not drink alcohol or use drugs.  MEDICARE WELLNESS OBJECTIVES: Physical activity: Current Exercise Habits: Home exercise routine Cardiac risk factors: Cardiac Risk Factors include: advanced age (>86men, >35 women);dyslipidemia;male gender;hypertension;sedentary lifestyle;obesity (BMI >30kg/m2) Depression/mood screen:   Depression screen [facility] 2/9 01/03/2018  Decreased Interest 0  Down, Depressed, Hopeless 0  PHQ - 2 Score 0    ADLs:  In your present state of health, do you have any difficulty performing the following activities:  01/03/2018 10/03/2017  Hearing? N N  Vision? N N  Difficulty concentrating or making decisions? N N  Walking or climbing stairs? N N  Dressing or bathing? N N  Doing errands, shopping? N N  Some recent data might be hidden     Cognitive Testing  Alert? Yes  Normal Appearance?Yes  Oriented to person? Yes  Place? Yes   Time? Yes  Recall of three objects?  Yes  Can perform simple calculations? Yes  Displays appropriate judgment?Yes  Can read the correct time from a watch face?Yes  EOL planning: Does Patient Have a Medical Advance Directive?: No Would patient like information on creating a medical advance directive?: Yes (MAU/Ambulatory/Procedural Areas - Information given)  Review of Systems:  Review of Systems  Constitutional: Negative.   HENT: Negative.   Eyes: Negative.   Respiratory: Negative.   Cardiovascular: Negative.   Gastrointestinal: Negative for abdominal pain, blood in stool, constipation, diarrhea, heartburn, melena, nausea and vomiting.  Genitourinary: Negative.   Musculoskeletal: Negative.   Skin: Negative.   Neurological: Negative.   Endo/Heme/Allergies: Negative.   Psychiatric/Behavioral: Negative for depression, hallucinations, memory loss, substance abuse and suicidal ideas. The patient is not nervous/anxious and does not have insomnia.    Physical Exam: BP 124/68   Pulse 62   Temp 97.7 F (36.5 C)   Resp 16   Ht 5' 4.5" (1.638 m)   Wt 197 lb 6.4 oz (89.5 kg)   SpO2 98%   BMI 33.36 kg/m  Wt Readings from Last 3 Encounters:  01/03/18 197 lb 6.4 oz (89.5 kg)  10/03/17 196 lb 6.4 oz (89.1 kg)  07/01/17 201 lb (91.2 kg)   General Appearance: Well nourished, in no apparent distress. Eyes: PERRLA, EOMs, conjunctiva no swelling or erythema Sinuses: No Frontal/maxillary tenderness ENT/Mouth: Ext aud canals clear, TMs without erythema, bulging. No erythema, swelling, or exudate on post pharynx.  Tonsils not swollen or erythematous. Hearing decreased but  has hearing aids Neck: Supple, thyroid normal.  Respiratory: Respiratory effort normal, BS equal bilaterally without rales, rhonchi, wheezing or stridor.  Cardio: RRR with no MRGs. Brisk peripheral pulses without edema.  Abdomen: Soft, + BS.  Non tender, no guarding, rebound, hernias, masses. Lymphatics: Non tender without lymphadenopathy.  Musculoskeletal: Full ROM, 5/5 strength, Normal gait Skin: Warm, dry without rashes, lesions, ecchymosis.  Neuro: Cranial nerves intact. No cerebellar symptoms.  Psych: Awake and oriented X 3, normal affect, Insight and Judgment appropriate.   Medicare Attestation I have personally reviewed: The patient's medical and social history Their use of alcohol, tobacco or illicit drugs Their current medications and supplements The patient's functional ability including ADLs,fall risks, home safety  risks, cognitive, and hearing and visual impairment Diet and physical activities Evidence for depression or mood disorders  The patient's weight, height, BMI, and visual acuity have been recorded in the chart.  I have made referrals, counseling, and provided education to the patient based on review of the above and I have provided the patient with a written personalized care plan for preventive services.     Vicie Mutters, PA-C 11:46 AM [facility]

## 2018-01-03 NOTE — Progress Notes (Signed)
REFILL'S SENT  

## 2018-01-03 NOTE — Patient Instructions (Addendum)
Information for patients with Gout  Gout defined-Gout occurs when urate crystals accumulate in your joint causing the inflammation and intense pain of gout attack.  Urate crystals can form when you have high levels of uric acid in your blood.  Your body produces uric acid when it breaks down prurines-substances that are found naturally in your body, as well as in certain foods such as organ meats, anchioves, herring, asparagus, and mushrooms.  Normally uric acid dissolves in your blood and passes through your kidneys into your urine.  But sometimes your body either produces too much uric acid or your kidneys excrete too little uric acid.  When this happens, uric acid can build up, forming sharp needle-like urate crystals in a joint or surrounding tissue that cause pain, inflammation and swelling.    Gout is characterized by sudden, severe attacks of pain, redness and tenderness in joints, often the joint at the base of the big toe.  Gout is complex form of arthritis that can affect anyone.  Men are more likely to get gout but women become increasingly more susceptible to gout after menopause.  An acute attack of gout can wake you up in the middle of the night with the sensation that your big toe is on fire.  The affected joint is hot, swollen and so tender that even the weight or the sheet on it may seem intolerable.  If you experience symptoms of an acute gout attack it is important to your doctor as soon as the symptoms start.  Gout that goes untreated can lead to worsening pain and joint damage.  Risk Factors:  You are more likely to develop gout if you have high levels of uric acid in your body.    Factors that increase the uric acid level in your body include:  Lifestyle factors.  Excessive alcohol use-generally more than two drinks a day for men and more than one for women increase the risk of gout.  Medical conditions.  Certain conditions make it more likely that you will develop gout.   These include hypertension, and chronic conditions such as diabetes, high levels of fat and cholesterol in the blood, and narrowing of the arteries.  Certain medications.  The uses of Thiazide diuretics- commonly used to treat hypertension and low dose aspirin can also increase uric acid levels.  Family history of gout.  If other members of your family have had gout, you are more likely to develop the disease.  Age and sex. Gout occurs more often in men than it does in women, primarily because women tend to have lower uric acid levels than men do.  Men are more likely to develop gout earlier usually between the ages of 40-50- whereas women generally develop signs and symptoms after menopause.    Tests and diagnosis:  Tests to help diagnose gout may include:  Blood test.  Your doctor may recommend a blood test to measure the uric acid level in your blood .  Blood tests can be misleading, though.  Some people have high uric acid levels but never experience gout.  And some people have signs and symptoms of gout, but don't have unusual levels of uric acid in their blood.  Joint fluid test.  Your doctor may use a needle to draw fluid from your affected joint.  When examined under the microscope, your joint fluid may reveal urate crystals.  Treatment:  Treatment for gout usually involves medications.  What medications you and your doctor choose will be   based on your current health and other medications you currently take.  Gout medications can be used to treat acute gout attacks and prevent future attacks as well as reduce your risk of complications from gout such as the development of tophi from urate crystal deposits.  Alternative medicine:   Certain foods have been studied for their potential to lower uric acid levels, including:  Coffee.  Studies have found an association between coffee drinking (regular and decaf) and lower uric acid levels.  The evidence is not enough to encourage  non-coffee drinkers to start, but it may give clues to new ways of treating gout in the future.  Vitamin C.  Supplements containing vitamin C may reduce the levels of uric acid in your blood.  However, vitamin as a treatment for gout. Don't assume that if a little vitamin C is good, than lots is better.  Megadoses of vitamin C may increase your bodies uric acid levels.  Cherries.  Cherries have been associated with lower levels of uric acid in studies, but it isn't clear if they have any effect on gout signs and symptoms.  Eating more cherries and other dar-colored fruits, such as blackberries, blueberries, purple grapes and raspberries, may be a safe way to support your gout treatment.    Lifestyle/Diet Recommendations:   Drink 8 to 16 cups ( about 2 to 4 liters) of fluid each day, with at least half being water.  Avoid alcohol  Eat a moderate amount of protein, preferably from healthy sources, such as low-fat or fat-free dairy, tofu, eggs, and nut butters.  Limit you daily intake of meat, fish, and poultry to 4 to 6 ounces.  Avoid high fat meats and desserts.  Decrease you intake of shellfish, beef, lamb, pork, eggs and cheese.  Choose a good source of vitamin C daily such as citrus fruits, strawberries, broccoli,  brussel sprouts, papaya, and cantaloupe.   Choose a good source of vitamin A every other day such as yellow fruits, or dark green/yellow vegetables.  Avoid drastic weigh reduction or fasting.  If weigh loss is desired lose it over a period of several months.  See "dietary considerations.." chart for specific food recommendations.  Dietary Considerations for people with Gout  Food with negligible purine content (0-15 mg of purine nitrogen per 100 grams food)  May use as desired except on calorie variations  Non fat milk Cocoa Cereals (except in list II) Hard candies  Buttermilk Carbonated drinks Vegetables (except in list II) Sherbet  Coffee Fruits Sugar Honey  Tea  Cottage Cheese Gelatin-jell-o Salt  Fruit juice Breads Angel food Cake   Herbs/spices Jams/Jellies Carnation Instant Breakfast    Foods that do not contain excessive purine content, but must be limited due to fat content  Cream Eggs Oil and Salad Dressing  Half and Half Peanut Butter Chocolate  Whole Milk Cakes Potato Chips  Butter Ice Cream Fried Foods  Cheese Nuts Waffles, pancakes   List II: Food with moderate purine content (50-150 mg of purine nitrogen per 100 grams of food)  Limit total amount each day to 5 oz. cooked Lean meat, other than those on list III   Poultry, other than those on list III Fish, other than those on list III   Seafood, other than those on list III  These foods may be used occasionally  Peas Lentils Bran  Spinach Oatmeal Dried Beans and Peas  Asparagus Wheat Germ Mushrooms   Additional information about meat choices  Choose fish   and poultry, particularly without skin, often.  Select lean, well trimmed cuts of meat.  Avoid all fatty meats, bacon , sausage, fried meats, fried fish, or poultry, luncheon meats, cold cuts, hot dogs, meats canned or frozen in gravy, spareribs and frozen and packaged prepared meats.   List III: Foods with HIGH purine content / Foods to AVOID (150-800 mg of purine nitrogen per 100 grams of food)  Anchovies Herring Meat Broths  Liver Mackerel Meat Extracts  Kidney Scallops Meat Drippings  Sardines Wild Game Mincemeat  Sweetbreads Goose Gravy  Heart Tongue Yeast, baker's and brewers   Commercial soups made with any of the foods listed in List II or List III  In addition avoid all alcoholic beverages     When it comes to diets, agreement about the perfect plan isn't easy to find, even among the experts. Experts at the [employer] developed an idea known as the Healthy Eating Plate. Just imagine a plate divided into logical, healthy portions.  The emphasis is on diet quality:  Load up on vegetables  and fruits - one-half of your plate: Aim for color and variety, and remember that potatoes don't count.  Go for whole grains - one-quarter of your plate: Whole wheat, barley, wheat berries, quinoa, oats, brown rice, and foods made with them. If you want pasta, go with whole wheat pasta.  Protein power - one-quarter of your plate: Fish, chicken, beans, and nuts are all healthy, versatile protein sources. Limit red meat.  The diet, however, does go beyond the plate, offering a few other suggestions.  Use healthy plant oils, such as olive, canola, soy, corn, sunflower and peanut. Check the labels, and avoid partially hydrogenated oil, which have unhealthy trans fats.  If you're thirsty, drink water. Coffee and tea are good in moderation, but skip sugary drinks and limit milk and dairy products to one or two daily servings.  The type of carbohydrate in the diet is more important than the amount. Some sources of carbohydrates, such as vegetables, fruits, whole grains, and beans-are healthier than others.  Finally, stay active.

## 2018-01-04 LAB — CBC WITH DIFFERENTIAL/PLATELET
BASOS ABS: 42 {cells}/uL (ref 0–200)
Basophils Relative: 0.5 %
EOS ABS: 210 {cells}/uL (ref 15–500)
Eosinophils Relative: 2.5 %
HEMATOCRIT: 42.7 % (ref 38.5–50.0)
HEMOGLOBIN: 14.3 g/dL (ref 13.2–17.1)
LYMPHS ABS: 3284 {cells}/uL (ref 850–3900)
MCH: 29.8 pg (ref 27.0–33.0)
MCHC: 33.5 g/dL (ref 32.0–36.0)
MCV: 89 fL (ref 80.0–100.0)
MONOS PCT: 7.6 %
MPV: 9.6 fL (ref 7.5–12.5)
NEUTROS ABS: 4225 {cells}/uL (ref 1500–7800)
Neutrophils Relative %: 50.3 %
Platelets: 257 10*3/uL (ref 140–400)
RBC: 4.8 10*6/uL (ref 4.20–5.80)
RDW: 13.8 % (ref 11.0–15.0)
Total Lymphocyte: 39.1 %
WBC: 8.4 10*3/uL (ref 3.8–10.8)
WBCMIX: 638 {cells}/uL (ref 200–950)

## 2018-01-04 LAB — COMPLETE METABOLIC PANEL WITH GFR
AG Ratio: 1.6 (calc) (ref 1.0–2.5)
ALBUMIN MSPROF: 4.5 g/dL (ref 3.6–5.1)
ALKALINE PHOSPHATASE (APISO): 71 U/L (ref 40–115)
ALT: 31 U/L (ref 9–46)
AST: 30 U/L (ref 10–35)
BILIRUBIN TOTAL: 0.3 mg/dL (ref 0.2–1.2)
BUN / CREAT RATIO: 13 (calc) (ref 6–22)
BUN: 18 mg/dL (ref 7–25)
CO2: 30 mmol/L (ref 20–32)
CREATININE: 1.38 mg/dL — AB (ref 0.70–1.25)
Calcium: 10.1 mg/dL (ref 8.6–10.3)
Chloride: 103 mmol/L (ref 98–110)
GFR, EST AFRICAN AMERICAN: 60 mL/min/{1.73_m2} (ref >=60)
GFR, Est Non African American: 52 mL/min/{1.73_m2} — ABNORMAL LOW (ref >=60)
GLUCOSE: 112 mg/dL — AB (ref 65–99)
Globulin: 2.9 g/dL (calc) (ref 1.9–3.7)
Potassium: 4.8 mmol/L (ref 3.5–5.3)
Sodium: 138 mmol/L (ref 135–146)
TOTAL PROTEIN: 7.4 g/dL (ref 6.1–8.1)

## 2018-01-04 LAB — LIPID PANEL
Cholesterol: 146 mg/dL (ref <200)
HDL: 28 mg/dL — ABNORMAL LOW (ref >40)
LDL CHOLESTEROL (CALC): 85 mg/dL
NON-HDL CHOLESTEROL (CALC): 118 mg/dL (ref <130)
TRIGLYCERIDES: 237 mg/dL — AB (ref <150)
Total CHOL/HDL Ratio: 5.2 (calc) — ABNORMAL HIGH (ref <5.0)

## 2018-01-04 LAB — HEMOGLOBIN A1C
HEMOGLOBIN A1C: 6.4 %{Hb} — AB (ref <5.7)
Mean Plasma Glucose: 137 (calc)
eAG (mmol/L): 7.6 (calc)

## 2018-01-04 LAB — TSH: TSH: 1.11 mIU/L (ref 0.40–4.50)

## 2018-01-04 LAB — URIC ACID: URIC ACID, SERUM: 5.1 mg/dL (ref 4.0–8.0)

## 2018-01-13 ENCOUNTER — Ambulatory Visit: Payer: Self-pay | Admitting: Physician Assistant

## 2018-01-17 LAB — LIPID PANEL
CHOL/HDL RATIO: 4.2 (calc) (ref <5.0)
Cholesterol: 122 mg/dL (ref <200)
HDL: 29 mg/dL — ABNORMAL LOW (ref >40)
LDL CHOLESTEROL (CALC): 68 mg/dL
Non-HDL Cholesterol (Calc): 93 mg/dL (calc) (ref <130)
TRIGLYCERIDES: 177 mg/dL — AB (ref <150)

## 2018-01-17 LAB — CBC WITH DIFFERENTIAL/PLATELET
BASOS PCT: 0.4 %
Basophils Absolute: 30 cells/uL (ref 0–200)
Eosinophils Absolute: 122 cells/uL (ref 15–500)
Eosinophils Relative: 1.6 %
HCT: 43.3 % (ref 38.5–50.0)
HEMOGLOBIN: 14.7 g/dL (ref 13.2–17.1)
LYMPHS ABS: 3367 {cells}/uL (ref 850–3900)
MCH: 29.9 pg (ref 27.0–33.0)
MCHC: 33.9 g/dL (ref 32.0–36.0)
MCV: 88.2 fL (ref 80.0–100.0)
MPV: 9.4 fL (ref 7.5–12.5)
Monocytes Relative: 4.5 %
NEUTROS PCT: 49.2 %
Neutro Abs: 3739 cells/uL (ref 1500–7800)
Platelets: 282 10*3/uL (ref 140–400)
RBC: 4.91 10*6/uL (ref 4.20–5.80)
RDW: 13.3 % (ref 11.0–15.0)
Total Lymphocyte: 44.3 %
WBC: 7.6 10*3/uL (ref 3.8–10.8)
WBCMIX: 342 {cells}/uL (ref 200–950)

## 2018-01-17 LAB — COMPLETE METABOLIC PANEL WITH GFR
AG Ratio: 1.5 (calc) (ref 1.0–2.5)
ALBUMIN MSPROF: 4.4 g/dL (ref 3.6–5.1)
ALKALINE PHOSPHATASE (APISO): 75 U/L (ref 40–115)
ALT: 22 U/L (ref 9–46)
AST: 21 U/L (ref 10–35)
BUN: 14 mg/dL (ref 7–25)
CALCIUM: 9.7 mg/dL (ref 8.6–10.3)
CO2: 27 mmol/L (ref 20–32)
CREATININE: 1.21 mg/dL (ref 0.70–1.25)
Chloride: 102 mmol/L (ref 98–110)
GFR, EST AFRICAN AMERICAN: 71 mL/min/{1.73_m2} (ref >=60)
GFR, EST NON AFRICAN AMERICAN: 61 mL/min/{1.73_m2} (ref >=60)
Globulin: 3 g/dL (calc) (ref 1.9–3.7)
Glucose, Bld: 159 mg/dL — ABNORMAL HIGH (ref 65–99)
Potassium: 4.4 mmol/L (ref 3.5–5.3)
Sodium: 137 mmol/L (ref 135–146)
TOTAL PROTEIN: 7.4 g/dL (ref 6.1–8.1)
Total Bilirubin: 0.4 mg/dL (ref 0.2–1.2)

## 2018-01-17 LAB — MAGNESIUM: MAGNESIUM: 1.9 mg/dL (ref 1.5–2.5)

## 2018-01-17 LAB — URIC ACID: Uric Acid, Serum: 5.6 mg/dL (ref 4.0–8.0)

## 2018-01-17 LAB — TSH: TSH: 1.02 mIU/L (ref 0.40–4.50)

## 2018-01-17 LAB — HEMOGLOBIN A1C
Hgb A1c MFr Bld: 6.3 % of total Hgb — ABNORMAL HIGH (ref <5.7)
Mean Plasma Glucose: 134 (calc)
eAG (mmol/L): 7.4 (calc)

## 2018-01-17 LAB — VITAMIN D 25 HYDROXY (VIT D DEFICIENCY, FRACTURES): VIT D 25 HYDROXY: 55 ng/mL (ref 30–100)

## 2018-01-17 LAB — INSULIN, RANDOM: Insulin: 158.5 u[IU]/mL — ABNORMAL HIGH (ref 2.0–19.6)

## 2018-04-13 ENCOUNTER — Encounter: Payer: Self-pay | Admitting: Internal Medicine

## 2018-04-13 NOTE — Patient Instructions (Signed)

## 2018-04-13 NOTE — Progress Notes (Signed)
[facility]   [name], M.D.     Uvaldo Bristle. Silverio Lay, P.A.-C Liane Comber, [place]                [address] [place], [place] [address] Telephone [phone] Telefax [phone] Comprehensive Evaluation & Examination     This very nice 67 y.o. MBM presents for a comprehensive evaluation and management of multiple medical co-morbidities.  Patient has been followed for HTN, HLD, T2_NIDDM and Vitamin D Deficiency. Patient's Gout is controlled with his meds. Patient had a Robotic Prostatectomy for Prostate Ca in 2008.  In 2016 he had a penile pump implanted. Hew also has hx/o bilat nephrolithiasis and hx/o nephrostomy tube extraction. He reports recent Rt sided back pain & desires to have his Stone burden assessed.      HTN predates since 1996. Patient's BP has been controlled at home.  Today's BP is at goal - 124/76. Patient denies any cardiac symptoms as chest pain, palpitations, shortness of breath, dizziness or ankle swelling.     Patient's hyperlipidemia is controlled with diet and medications. Patient denies myalgias or other medication SE's. Last lipids were at goal albeit elevated Trig's: Lab Results  Component Value Date   CHOL 146 01/03/2018   HDL 28 (L) 01/03/2018   LDLCALC 85 01/03/2018   TRIG 237 (H) 01/03/2018   CHOLHDL 5.2 (H) 01/03/2018      Patient has hx/o T2_NIDDM /CKD 2 (2011) and patient denies reactive hypoglycemic symptoms, visual blurring, diabetic polys or paresthesias. Last A1c was not at goal: Lab Results  Component Value Date   HGBA1C 6.4 (H) 01/03/2018       Finally, patient has history of Vitamin D Deficiency ("11" / 2008) and last vitamin D was not at goal (70-100): Lab Results  Component Value Date   VD25OH 55 10/03/2017   Current Outpatient Medications on File Prior to Visit  Medication Sig  . allopurinol (ZYLOPRIM) 300 MG tablet Take 1 tablet (300 mg  total) by mouth daily.  Marland Kitchen aspirin 81 MG tablet Take 81 mg by mouth daily.    . bisoprolol-hydrochlorothiazide (ZIAC) 5-6.25 MG tablet Take 1 tablet by mouth daily. for blood pressure  . Cholecalciferol (VITAMIN D3) 2000 UNITS capsule Take 8,000 Units by mouth daily.  . citalopram (CELEXA) 20 MG tablet Take 1 tablet (20 mg total) by mouth daily.  . diclofenac sodium (VOLTAREN) 1 % GEL Apply 4 g topically 4 (four) times daily.  Marland Kitchen ezetimibe (ZETIA) 10 MG tablet Take 1 tablet (10 mg total) by mouth daily.  . fluticasone (FLONASE) 50 MCG/ACT nasal spray Place 1 spray into both nostrils daily as needed for allergies.  . metFORMIN (GLUCOPHAGE-XR) 500 MG 24 hr tablet TAKE 2 TABLETS TWICE A DAY WITH MEALS AS DIRECTED  . Multiple Vitamins-Minerals (MULTIVITAMIN WITH MINERALS) tablet Take 1 tablet by mouth daily.    . Omega-3 Fatty Acids (FISH OIL) 1000 MG CAPS Take 2 capsules by mouth daily.  . valsartan (DIOVAN) 320 MG tablet Take 1 tablet (320 mg total) by mouth daily.  . Wheat Dextrin (BENEFIBER DRINK MIX PO) Take 1 Package by mouth daily.    No current facility-administered medications on file prior to visit.    Allergies  Allergen Reactions  . Cialis [Tadalafil] Other (See Comments)    Hot flashes  . Fructose Nausea And Vomiting  . Morphine  And Related Itching   Past Medical History:  Diagnosis Date  . ED (erectile dysfunction) of organic origin   . History of colon polyps   . History of kidney stones   . History of positive PPD    06/ 2006--  CXR NORMAL  . History of prostate cancer    DEC 2008--  S/P  RADICAL PROSTATECTOMY  . Hyperlipidemia   . Hypertension   . Hypogonadism male   . Mixed hyperlipidemia   . Nocturia   . Sigmoid diverticulosis    MILD  . Type 2 diabetes mellitus [facility])    Health Maintenance  Topic Date Due  . OPHTHALMOLOGY EXAM  05/06/2017  . INFLUENZA VACCINE  11/17/2017  . HEMOGLOBIN A1C  07/04/2018  . FOOT EXAM  04/14/2019  . COLONOSCOPY  10/27/2020  .  TETANUS/TDAP  06/28/2021  . Hepatitis C Screening  Completed  . PNA vac Low Risk Adult  Completed   Immunization History  Administered Date(s) Administered  . DTaP 02/17/2002  . Influenza Whole 01/08/2013  . Influenza, High Dose Seasonal PF 02/26/2014, 12/24/2015, 03/21/2017  . Pneumococcal Conjugate-13 07/23/2015  . Pneumococcal Polysaccharide-23 02/18/1996, 01/03/2018  . Tdap 06/29/2011  . Zoster 03/23/2011   Last Colon - 10/28/2010 - Dr Deatra Ina - Recc 10 yr f/u  - due July 2022.  Past Surgical History:  Procedure Laterality Date  . ACHILLES TENDON REPAIR Right 1989  . COLONOSCOPY N/A last one 10-28-2010    due 10 yr f/u in 2022 - [facility]  . FINGER AMPUTATION  age 67   left Middle  . NEGATIVE SLEEP STUDY  2010  per pt  . PENILE PROSTHESIS IMPLANT N/A 10/28/2014   Procedure: IMPLANTATION 3 PIECE PENILE INFLATABLE PROTHESIS/COLOPLAST SCROTAL APPROACH;  Surgeon: Kathie Rhodes, MD;  Location: [facility];  Service: Urology;  Laterality: N/A;  . PERCUTANEOUS NEPHROLITHOTRIPSY Left 04-04-2009// second look 04-15-2009  . ROBOT ASSISTED LAPAROSCOPIC RADICAL PROSTATECTOMY  Dec 2008  . SHOULDER OPEN ROTATOR CUFF REPAIR Right 05-11-2006  . URETEROLITHOTOMY  2002   Family History  Problem Relation Age of Onset  . Heart disease Mother   . Diabetes Mother   . Arthritis Mother   . Alcohol abuse Father   . Hypertension Father    Social History   Socioeconomic History  . Marital status: Married    Spouse name: Wife Cody Zamora recently received a renal transplant with Hep C and she's id  . Number of children: 1 son & 1 daughter   Occupational History  . 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   Tobacco Use  . Smoking status: Never Smoker  . Smokeless tobacco: Never Used  Substance and Sexual Activity  . Alcohol use: No  . Drug use: No  . Sexual activity: Not on file    ROS Constitutional: Denies fever, chills, weight loss/gain, headaches, insomnia,  night sweats  or change in appetite. Does c/o fatigue. Eyes: Denies redness, blurred vision, diplopia, discharge, itchy or watery eyes.  ENT: Denies discharge, congestion, post nasal drip, epistaxis, sore throat, earache, hearing loss, dental pain, Tinnitus, Vertigo, Sinus pain or snoring.  Cardio: Denies chest pain, palpitations, irregular heartbeat, syncope, dyspnea, diaphoresis, orthopnea, PND, claudication or edema Respiratory: denies cough, dyspnea, DOE, pleurisy, hoarseness, laryngitis or wheezing.  Gastrointestinal: Denies dysphagia, heartburn, reflux, water brash, pain, cramps, nausea, vomiting, bloating, diarrhea, constipation, hematemesis, melena, hematochezia, jaundice or hemorrhoids Genitourinary: Denies dysuria, frequency, urgency, nocturia, hesitancy, discharge, hematuria or flank pain Musculoskeletal: Denies arthralgia, myalgia, stiffness, Jt.  Swelling, pain, limp or strain/sprain. Denies Falls. Skin: Denies puritis, rash, hives, warts, acne, eczema or change in skin lesion Neuro: No weakness, tremor, incoordination, spasms, paresthesia or pain Psychiatric: Denies confusion, memory loss or sensory loss. Denies Depression. Endocrine: Denies change in weight, skin, hair change, nocturia, and paresthesia, diabetic polys, visual blurring or hyper / hypo glycemic episodes.  Heme/Lymph: No excessive bleeding, bruising or enlarged lymph nodes.  Physical Exam  BP 124/76   Pulse 60   Temp (!) 97.5 F (36.4 C)   Resp 16   Ht 5' 5.5" (1.664 m)   Wt 199 lb 12.8 oz (90.6 kg)   BMI 32.74 kg/m   General Appearance: Well nourished and well groomed and in no apparent distress.  Eyes: PERRLA, EOMs, conjunctiva no swelling or erythema, normal fundi and vessels. Sinuses: No frontal/maxillary tenderness ENT/Mouth: EACs patent / TMs  nl. Nares clear without erythema, swelling, mucoid exudates. Oral hygiene is good. No erythema, swelling, or exudate. Tongue normal, non-obstructing. Tonsils not swollen or  erythematous. Hearing normal.  Neck: Supple, thyroid not palpable. No bruits, nodes or JVD. Respiratory: Respiratory effort normal.  BS equal and clear bilateral without rales, rhonci, wheezing or stridor. Cardio: Heart sounds are normal with regular rate and rhythm and no murmurs, rubs or gallops. Peripheral pulses are normal and equal bilaterally without edema. No aortic or femoral bruits. Chest: symmetric with normal excursions and percussion.  Abdomen: Soft, with Nl bowel sounds. Nontender, no guarding, rebound, hernias, masses, or organomegaly.  Lymphatics: Non tender without lymphadenopathy.  Genitourinary: No hernias.Testes nl. DRE - prostate nl for age - smooth & firm w/o nodules. Musculoskeletal: Full ROM all peripheral extremities, joint stability, 5/5 strength, and normal gait. Skin: Warm and dry without rashes, lesions, cyanosis, clubbing or  ecchymosis.  Neuro: Cranial nerves intact, reflexes equal bilaterally. Normal muscle tone, no cerebellar symptoms. Sensation intact to touch, vibratory and Monofilament to the toes bilaterally. Pysch: Alert and oriented X 3 with normal affect, insight and judgment appropriate.   Assessment and Plan  1. Essential hypertension  - EKG 12-Lead - [place], RETROPERITNL ABD,  LTD - Urinalysis, Routine w reflex microscopic - Microalbumin / creatinine urine ratio - CBC with Differential/Platelet - COMPLETE METABOLIC PANEL WITH GFR - Magnesium - TSH  2. Hyperlipidemia, mixed  - EKG 12-Lead - [place], RETROPERITNL ABD,  LTD - Lipid panel - TSH  3. Type 2 diabetes mellitus with stage 2 chronic kidney disease, without long-term current use of insulin ([name])  - EKG 12-Lead - [facility] DIABETES FOOT EXAM - LOW EXTREMITY NEUR EXAM DOCUM - Hemoglobin A1c - Insulin, random  4. Vitamin D deficiency  - VITAMIN D 25 Hydroxyl  5. Gout  - Uric acid  6. CKD stage 2 due to type 2 diabetes mellitus ([name])  - COMPLETE METABOLIC PANEL WITH GFR  7. Depression,  unspecified    8. Screening for colorectal cancer  - POC Hemoccult Bld/Stl   9. History of prostate cancer  - PSA  10. Prostate cancer screening  - PSA  11. Screening for ischemic heart disease  - EKG 12-Lead  12. FHx: heart disease  - EKG 12-Lead - [place], RETROPERITNL ABD,  LTD  13. Screening for AAA (aortic abdominal aneurysm)  - [place], RETROPERITNL ABD,  LTD  14. Medication management  - Urinalysis, Routine w reflex microscopic - Microalbumin / creatinine urine ratio - CBC with Differential/Platelet - COMPLETE METABOLIC PANEL WITH GFR - Magnesium - Lipid panel - TSH - Hemoglobin A1c - Insulin,  random - VITAMIN D 25 Hydroxyl       Patient was counseled in prudent diet, weight control to achieve/maintain BMI less than 25, BP monitoring, regular exercise and medications as discussed.  Discussed med effects and SE's. Routine screening labs and tests as requested with regular follow-up as recommended. Over 40 minutes of exam, counseling, chart review and high complex critical decision making was performed

## 2018-04-14 ENCOUNTER — Ambulatory Visit (INDEPENDENT_AMBULATORY_CARE_PROVIDER_SITE_OTHER): Payer: Medicare Other | Admitting: Internal Medicine

## 2018-04-14 VITALS — BP 124/76 | HR 60 | Temp 97.5°F | Resp 16 | Ht 65.5 in | Wt 199.8 lb

## 2018-04-14 DIAGNOSIS — R945 Abnormal results of liver function studies: Secondary | ICD-10-CM

## 2018-04-14 DIAGNOSIS — Z8546 Personal history of malignant neoplasm of prostate: Secondary | ICD-10-CM

## 2018-04-14 DIAGNOSIS — Z8249 Family history of ischemic heart disease and other diseases of the circulatory system: Secondary | ICD-10-CM

## 2018-04-14 DIAGNOSIS — N182 Chronic kidney disease, stage 2 (mild): Secondary | ICD-10-CM

## 2018-04-14 DIAGNOSIS — E1122 Type 2 diabetes mellitus with diabetic chronic kidney disease: Secondary | ICD-10-CM | POA: Diagnosis not present

## 2018-04-14 DIAGNOSIS — E782 Mixed hyperlipidemia: Secondary | ICD-10-CM

## 2018-04-14 DIAGNOSIS — Z125 Encounter for screening for malignant neoplasm of prostate: Secondary | ICD-10-CM

## 2018-04-14 DIAGNOSIS — Z23 Encounter for immunization: Secondary | ICD-10-CM

## 2018-04-14 DIAGNOSIS — Z79899 Other long term (current) drug therapy: Secondary | ICD-10-CM

## 2018-04-14 DIAGNOSIS — Z1212 Encounter for screening for malignant neoplasm of rectum: Secondary | ICD-10-CM

## 2018-04-14 DIAGNOSIS — I10 Essential (primary) hypertension: Secondary | ICD-10-CM | POA: Diagnosis not present

## 2018-04-14 DIAGNOSIS — F329 Major depressive disorder, single episode, unspecified: Secondary | ICD-10-CM

## 2018-04-14 DIAGNOSIS — M109 Gout, unspecified: Secondary | ICD-10-CM

## 2018-04-14 DIAGNOSIS — Z1211 Encounter for screening for malignant neoplasm of colon: Secondary | ICD-10-CM

## 2018-04-14 DIAGNOSIS — F32A Depression, unspecified: Secondary | ICD-10-CM

## 2018-04-14 DIAGNOSIS — Z136 Encounter for screening for cardiovascular disorders: Secondary | ICD-10-CM

## 2018-04-14 DIAGNOSIS — R7989 Other specified abnormal findings of blood chemistry: Secondary | ICD-10-CM

## 2018-04-14 DIAGNOSIS — E559 Vitamin D deficiency, unspecified: Secondary | ICD-10-CM

## 2018-04-14 DIAGNOSIS — N200 Calculus of kidney: Secondary | ICD-10-CM

## 2018-04-16 ENCOUNTER — Other Ambulatory Visit: Payer: Self-pay | Admitting: Internal Medicine

## 2018-04-16 DIAGNOSIS — N390 Urinary tract infection, site not specified: Secondary | ICD-10-CM

## 2018-04-17 ENCOUNTER — Other Ambulatory Visit: Payer: Medicare Other

## 2018-04-17 DIAGNOSIS — N390 Urinary tract infection, site not specified: Secondary | ICD-10-CM

## 2018-04-17 LAB — URINALYSIS, ROUTINE W REFLEX MICROSCOPIC
BILIRUBIN URINE: NEGATIVE
Bacteria, UA: NONE SEEN /HPF
Glucose, UA: NEGATIVE
HYALINE CAST: NONE SEEN /LPF
Hgb urine dipstick: NEGATIVE
Ketones, ur: NEGATIVE
NITRITE: NEGATIVE
SPECIFIC GRAVITY, URINE: 1.019 (ref 1.001–1.03)
SQUAMOUS EPITHELIAL / LPF: NONE SEEN /HPF (ref <=5)
pH: 5.5 (ref 5.0–8.0)

## 2018-04-17 LAB — MAGNESIUM: Magnesium: 1.9 mg/dL (ref 1.5–2.5)

## 2018-04-17 LAB — COMPLETE METABOLIC PANEL WITH GFR
AG Ratio: 1.5 (calc) (ref 1.0–2.5)
ALT: 34 U/L (ref 9–46)
AST: 32 U/L (ref 10–35)
Albumin: 4.7 g/dL (ref 3.6–5.1)
Alkaline phosphatase (APISO): 72 U/L (ref 40–115)
BUN/Creatinine Ratio: 15 (calc) (ref 6–22)
BUN: 19 mg/dL (ref 7–25)
CO2: 26 mmol/L (ref 20–32)
Calcium: 9.8 mg/dL (ref 8.6–10.3)
Chloride: 102 mmol/L (ref 98–110)
Creat: 1.31 mg/dL — ABNORMAL HIGH (ref 0.70–1.25)
GFR, Est African American: 64 mL/min/{1.73_m2} (ref >=60)
GFR, Est Non African American: 55 mL/min/{1.73_m2} — ABNORMAL LOW (ref >=60)
GLOBULIN: 3.1 g/dL (ref 1.9–3.7)
Glucose, Bld: 123 mg/dL — ABNORMAL HIGH (ref 65–99)
Potassium: 4.1 mmol/L (ref 3.5–5.3)
Sodium: 139 mmol/L (ref 135–146)
Total Bilirubin: 0.5 mg/dL (ref 0.2–1.2)
Total Protein: 7.8 g/dL (ref 6.1–8.1)

## 2018-04-17 LAB — CBC WITH DIFFERENTIAL/PLATELET
Absolute Monocytes: 458 cells/uL (ref 200–950)
Basophils Absolute: 30 cells/uL (ref 0–200)
Basophils Relative: 0.4 %
EOS ABS: 113 {cells}/uL (ref 15–500)
Eosinophils Relative: 1.5 %
HEMATOCRIT: 44.9 % (ref 38.5–50.0)
Hemoglobin: 15.4 g/dL (ref 13.2–17.1)
Lymphs Abs: 3195 cells/uL (ref 850–3900)
MCH: 30.4 pg (ref 27.0–33.0)
MCHC: 34.3 g/dL (ref 32.0–36.0)
MCV: 88.7 fL (ref 80.0–100.0)
MPV: 9.7 fL (ref 7.5–12.5)
Monocytes Relative: 6.1 %
Neutro Abs: 3705 cells/uL (ref 1500–7800)
Neutrophils Relative %: 49.4 %
Platelets: 265 10*3/uL (ref 140–400)
RBC: 5.06 10*6/uL (ref 4.20–5.80)
RDW: 13.6 % (ref 11.0–15.0)
Total Lymphocyte: 42.6 %
WBC: 7.5 10*3/uL (ref 3.8–10.8)

## 2018-04-17 LAB — INSULIN, RANDOM: Insulin: 44.9 u[IU]/mL — ABNORMAL HIGH (ref 2.0–19.6)

## 2018-04-17 LAB — PSA

## 2018-04-17 LAB — HEMOGLOBIN A1C
Hgb A1c MFr Bld: 6.9 % of total Hgb — ABNORMAL HIGH (ref <5.7)
Mean Plasma Glucose: 151 (calc)
eAG (mmol/L): 8.4 (calc)

## 2018-04-17 LAB — MICROALBUMIN / CREATININE URINE RATIO
CREATININE, URINE: 160 mg/dL (ref 20–320)
MICROALB UR: 6.3 mg/dL
Microalb Creat Ratio: 39 mcg/mg creat — ABNORMAL HIGH (ref <30)

## 2018-04-17 LAB — LIPID PANEL
Cholesterol: 149 mg/dL (ref <200)
HDL: 33 mg/dL — AB (ref >40)
LDL Cholesterol (Calc): 89 mg/dL (calc)
Non-HDL Cholesterol (Calc): 116 mg/dL (calc) (ref <130)
Total CHOL/HDL Ratio: 4.5 (calc) (ref <5.0)
Triglycerides: 163 mg/dL — ABNORMAL HIGH (ref <150)

## 2018-04-17 LAB — URIC ACID: Uric Acid, Serum: 5.9 mg/dL (ref 4.0–8.0)

## 2018-04-17 LAB — VITAMIN D 25 HYDROXY (VIT D DEFICIENCY, FRACTURES): Vit D, 25-Hydroxy: 65 ng/mL (ref 30–100)

## 2018-04-17 LAB — TSH: TSH: 1.42 mIU/L (ref 0.40–4.50)

## 2018-04-18 LAB — URINE CULTURE
MICRO NUMBER:: 91552587
Result:: NO GROWTH
SPECIMEN QUALITY:: ADEQUATE

## 2018-04-18 LAB — URINALYSIS, ROUTINE W REFLEX MICROSCOPIC
BILIRUBIN URINE: NEGATIVE
Bacteria, UA: NONE SEEN /HPF
Glucose, UA: NEGATIVE
Hgb urine dipstick: NEGATIVE
Hyaline Cast: NONE SEEN /LPF
KETONES UR: NEGATIVE
NITRITE: NEGATIVE
Protein, ur: NEGATIVE
Specific Gravity, Urine: 1.016 (ref 1.001–1.03)
Squamous Epithelial / HPF: NONE SEEN /HPF (ref <=5)
pH: 5.5 (ref 5.0–8.0)

## 2018-04-20 LAB — HEPATITIS B SURFACE ANTIBODY,QUALITATIVE: Hep B S Ab: REACTIVE — AB

## 2018-04-20 LAB — HEPATITIS B E ANTIBODY: Hep B E Ab: NONREACTIVE

## 2018-04-20 LAB — HEPATITIS A ANTIBODY, TOTAL: HEPATITIS A AB,TOTAL: NONREACTIVE

## 2018-04-20 LAB — HEPATITIS C ANTIBODY
Hepatitis C Ab: NONREACTIVE
SIGNAL TO CUT-OFF: 0.02 (ref <1.00)

## 2018-04-20 LAB — HEPATITIS B CORE ANTIBODY, TOTAL: Hep B Core Total Ab: REACTIVE — AB

## 2018-06-06 ENCOUNTER — Other Ambulatory Visit: Payer: Self-pay

## 2018-06-06 DIAGNOSIS — R8279 Other abnormal findings on microbiological examination of urine: Secondary | ICD-10-CM | POA: Diagnosis not present

## 2018-06-06 DIAGNOSIS — Z1212 Encounter for screening for malignant neoplasm of rectum: Principal | ICD-10-CM

## 2018-06-06 DIAGNOSIS — N200 Calculus of kidney: Secondary | ICD-10-CM | POA: Diagnosis not present

## 2018-06-06 DIAGNOSIS — Z1211 Encounter for screening for malignant neoplasm of colon: Secondary | ICD-10-CM

## 2018-06-06 LAB — POC HEMOCCULT BLD/STL (HOME/3-CARD/SCREEN)
Card #3 Fecal Occult Blood, POC: NEGATIVE
FECAL OCCULT BLD: NEGATIVE
Fecal Occult Blood, POC: NEGATIVE

## 2018-06-07 DIAGNOSIS — Z1211 Encounter for screening for malignant neoplasm of colon: Secondary | ICD-10-CM | POA: Diagnosis not present

## 2018-06-15 ENCOUNTER — Other Ambulatory Visit (HOSPITAL_COMMUNITY): Payer: Self-pay | Admitting: Urology

## 2018-06-15 ENCOUNTER — Other Ambulatory Visit: Payer: Self-pay | Admitting: Urology

## 2018-06-15 DIAGNOSIS — N200 Calculus of kidney: Secondary | ICD-10-CM

## 2018-06-28 ENCOUNTER — Encounter (HOSPITAL_COMMUNITY): Payer: Self-pay | Admitting: *Deleted

## 2018-07-17 ENCOUNTER — Ambulatory Visit (HOSPITAL_COMMUNITY): Payer: Medicare Other

## 2018-07-17 ENCOUNTER — Ambulatory Visit: Admit: 2018-07-17 | Payer: Medicare Other | Admitting: Urology

## 2018-07-17 ENCOUNTER — Other Ambulatory Visit (HOSPITAL_COMMUNITY): Payer: TRICARE For Life (TFL)

## 2018-07-17 SURGERY — NEPHROLITHOTOMY PERCUTANEOUS
Anesthesia: General | Laterality: Right

## 2018-07-19 NOTE — Progress Notes (Signed)
FOLLOW UP  Assessment and Plan:   Hypertension Well controlled with current medications  Monitor blood pressure at home; patient to call if consistently greater than 130/80 Continue DASH diet.   Reminder to go to the ER if any CP, SOB, nausea, dizziness, severe HA, changes vision/speech, left arm numbness and tingling and jaw pain.  Cholesterol Currently above goal on zetia; hx of statin intolerance -  Continue low cholesterol diet and exercise.  Check lipid panel.   Diabetes with diabetic chronic kidney disease Continue medication: metformin Continue diet and exercise.  Perform daily foot/skin check, notify office of any concerning changes.  Check A1C  CKD 2 Increase fluids, avoid NSAIDS, monitor sugars, will monitor Check CMP/GFR  Obesity with co morbidities Long discussion about weight loss, diet, and exercise Recommended diet heavy in fruits and veggies and low in animal meats, cheeses, and dairy products, appropriate calorie intake Discussed ideal weight for height  Continue with hello fresh, increasing veggies, add exercise 30 min 3x/week Will follow up in 3 months  Vitamin D Def At goal at last visit; continue supplementation for goal of 60-100 Defer Vit D level  Depression Significantly improved with celexa 20 mg daily and patient feels well controlled Lifestyle discussed: diet/exerise, sleep hygiene, stress management, hydration  Continue diet and meds as discussed. Further disposition pending results of labs. Discussed med's effects and SE's.   Over 30 minutes of exam, counseling, chart review, and critical decision making was performed.   Future Appointments  Date Time Provider [place]  10/25/2018  9:30 AM [name], MD [facility] None  01/25/2019 10:00 AM Vicie Mutters, PA-C [facility] None  05/04/2019 10:00 AM [name], MD [facility] None     ----------------------------------------------------------------------------------------------------------------------  HPI 68 y.o. male  presents for 3 month follow up on hypertension, cholesterol, diabetes, CKD2, obesity and vitamin D deficiency. He has hx of prostate CA and s/p radical prostatectomy in 2008, continues to follow with Dr. Karsten Ro for this and renal calculi.   He has depression/anxiety, currently prescribed celexa 20 mg daily  He describes ongoing depressive symptoms related to his wife's health condition but improved with medication.   BMI is Body mass index is 32.45 kg/m., he admits has not been working on exercise, has been working on diet, doing hello fresh meal prep, increasing veggies, baking more.  Wt Readings from Last 3 Encounters:  07/20/18 198 lb (89.8 kg)  04/14/18 199 lb 12.8 oz (90.6 kg)  01/03/18 197 lb 6.4 oz (89.5 kg)   His blood pressure has been controlled at home, today their BP is BP: 130/70  He does workout. He denies chest pain, shortness of breath, dizziness.   He is on cholesterol medication (zetia 10 mg daily, hx of statin intolerance) and denies myalgias. His cholesterol is not at goal. The cholesterol last visit was:   Lab Results  Component Value Date   CHOL 149 04/14/2018   HDL 33 (L) 04/14/2018   LDLCALC 89 04/14/2018   TRIG 163 (H) 04/14/2018   CHOLHDL 4.5 04/14/2018    He has been working on diet and exercise for T2DM on metformin, and denies foot ulcerations, hyperglycemia, hypoglycemia , increased appetite, nausea, paresthesia of the feet, polydipsia, polyuria, visual disturbances, vomiting and weight loss. He admits he hasn't been checking fasting glucose but plans to restart. Last A1C in the office was:  Lab Results  Component Value Date   HGBA1C 6.9 (H) 04/14/2018   Patient is on Vitamin D supplement and at goal of  60 at recent check:    Lab Results  Component Value Date   VD25OH 65 04/14/2018   Patient is on  allopurinol for gout and does not report a recent flare, he increased allopurinol to 300 mg BID and endorses improved pain in feet.  Lab Results  Component Value Date   LABURIC 5.9 04/14/2018     Current Medications:  Current Outpatient Medications on File Prior to Visit  Medication Sig  . aspirin 81 MG tablet Take 81 mg by mouth daily.    . bisoprolol-hydrochlorothiazide (ZIAC) 5-6.25 MG tablet Take 1 tablet by mouth daily. for blood pressure  . Cholecalciferol (VITAMIN D3) 2000 UNITS capsule Take 8,000 Units by mouth daily.  . diclofenac sodium (VOLTAREN) 1 % GEL Apply 4 g topically 4 (four) times daily.  Marland Kitchen ezetimibe (ZETIA) 10 MG tablet Take 1 tablet (10 mg total) by mouth daily.  . fluticasone (FLONASE) 50 MCG/ACT nasal spray Place 1 spray into both nostrils daily as needed for allergies.  . metFORMIN (GLUCOPHAGE-XR) 500 MG 24 hr tablet TAKE 2 TABLETS TWICE A DAY WITH MEALS AS DIRECTED  . Multiple Vitamins-Minerals (MULTIVITAMIN WITH MINERALS) tablet Take 1 tablet by mouth daily.    . Omega-3 Fatty Acids (FISH OIL) 1000 MG CAPS Take 2 capsules by mouth daily.  . valsartan (DIOVAN) 320 MG tablet Take 1 tablet (320 mg total) by mouth daily.  . Wheat Dextrin (BENEFIBER DRINK MIX PO) Take 1 Package by mouth daily.   . citalopram (CELEXA) 20 MG tablet Take 1 tablet (20 mg total) by mouth daily. (Patient not taking: Reported on 07/20/2018)   No current facility-administered medications on file prior to visit.      Allergies:  Allergies  Allergen Reactions  . Cialis [Tadalafil] Other (See Comments)    Hot flashes  . Fructose Nausea And Vomiting  . Morphine And Related Itching     Medical History:  Past Medical History:  Diagnosis Date  . ED (erectile dysfunction) of organic origin   . History of colon polyps   . History of kidney stones   . History of positive PPD    06/ 2006--  CXR NORMAL  . History of prostate cancer    DEC 2008--  S/P  RADICAL PROSTATECTOMY  .  Hyperlipidemia   . Hypertension   . Hypogonadism male   . Mixed hyperlipidemia   . Nocturia   . Sigmoid diverticulosis    MILD  . Type 2 diabetes mellitus ([name])    Family history- Reviewed and unchanged Social history- Reviewed and unchanged   Review of Systems:  Review of Systems  Constitutional: Negative for malaise/fatigue and weight loss.  HENT: Negative for hearing loss and tinnitus.   Eyes: Negative for blurred vision and double vision.  Respiratory: Negative for cough, shortness of breath and wheezing.   Cardiovascular: Negative for chest pain, palpitations, orthopnea, claudication and leg swelling.  Gastrointestinal: Negative for abdominal pain, blood in stool, constipation, diarrhea, heartburn, melena, nausea and vomiting.  Genitourinary: Negative.   Musculoskeletal: Negative for joint pain and myalgias.  Skin: Negative for rash.  Neurological: Negative for dizziness, tingling, sensory change, weakness and headaches.  Endo/Heme/Allergies: Negative for polydipsia.  Psychiatric/Behavioral: Negative for depression, memory loss, substance abuse and suicidal ideas. The patient is not nervous/anxious and does not have insomnia.   All other systems reviewed and are negative.    Physical Exam: BP 130/70   Pulse 64   Temp (!) 97.5 F (36.4 C)   Ht 5'  5.5" (1.664 m)   Wt 198 lb (89.8 kg)   SpO2 97%   BMI 32.45 kg/m  Wt Readings from Last 3 Encounters:  07/20/18 198 lb (89.8 kg)  04/14/18 199 lb 12.8 oz (90.6 kg)  01/03/18 197 lb 6.4 oz (89.5 kg)   General Appearance: Well nourished, in no apparent distress. Eyes: PERRLA, EOMs, conjunctiva no swelling or erythema Sinuses: No Frontal/maxillary tenderness ENT/Mouth: Ext aud canals clear, TMs without erythema, bulging. No erythema, swelling, or exudate on post pharynx.  Tonsils not swollen or erythematous. Hearing normal.  Neck: Supple, thyroid normal.  Respiratory: Respiratory effort normal, BS equal bilaterally without  rales, rhonchi, wheezing or stridor.  Cardio: RRR with no MRGs. Brisk peripheral pulses without edema.  Abdomen: Soft, + BS.  Non tender, no guarding, rebound, hernias, masses. Lymphatics: Non tender without lymphadenopathy.  Musculoskeletal: Full ROM, 5/5 strength, Normal gait. No bony abnormality, effusion/swelling  Skin: Warm, dry without rashes, lesions, ecchymosis.  Neuro: Cranial nerves intact. No cerebellar symptoms.  Psych: Awake and oriented X 3, normal affect, Insight and Judgment appropriate.    Izora Ribas, NP 9:02 AM [facility]

## 2018-07-20 ENCOUNTER — Encounter: Payer: Self-pay | Admitting: Adult Health

## 2018-07-20 ENCOUNTER — Other Ambulatory Visit: Payer: Self-pay

## 2018-07-20 ENCOUNTER — Ambulatory Visit (INDEPENDENT_AMBULATORY_CARE_PROVIDER_SITE_OTHER): Payer: Medicare Other | Admitting: Adult Health

## 2018-07-20 VITALS — BP 130/70 | HR 64 | Temp 97.5°F | Ht 65.5 in | Wt 198.0 lb

## 2018-07-20 DIAGNOSIS — N182 Chronic kidney disease, stage 2 (mild): Secondary | ICD-10-CM | POA: Diagnosis not present

## 2018-07-20 DIAGNOSIS — F32A Depression, unspecified: Secondary | ICD-10-CM

## 2018-07-20 DIAGNOSIS — I10 Essential (primary) hypertension: Secondary | ICD-10-CM | POA: Diagnosis not present

## 2018-07-20 DIAGNOSIS — E1122 Type 2 diabetes mellitus with diabetic chronic kidney disease: Secondary | ICD-10-CM

## 2018-07-20 DIAGNOSIS — M1A9XX0 Chronic gout, unspecified, without tophus (tophi): Secondary | ICD-10-CM

## 2018-07-20 DIAGNOSIS — F411 Generalized anxiety disorder: Secondary | ICD-10-CM | POA: Diagnosis not present

## 2018-07-20 DIAGNOSIS — F329 Major depressive disorder, single episode, unspecified: Secondary | ICD-10-CM

## 2018-07-20 DIAGNOSIS — E1169 Type 2 diabetes mellitus with other specified complication: Secondary | ICD-10-CM

## 2018-07-20 DIAGNOSIS — E669 Obesity, unspecified: Secondary | ICD-10-CM

## 2018-07-20 DIAGNOSIS — E559 Vitamin D deficiency, unspecified: Secondary | ICD-10-CM | POA: Diagnosis not present

## 2018-07-20 DIAGNOSIS — Z79899 Other long term (current) drug therapy: Secondary | ICD-10-CM

## 2018-07-20 DIAGNOSIS — E785 Hyperlipidemia, unspecified: Secondary | ICD-10-CM

## 2018-07-20 DIAGNOSIS — M109 Gout, unspecified: Secondary | ICD-10-CM | POA: Insufficient documentation

## 2018-07-20 MED ORDER — ALLOPURINOL 300 MG PO TABS: 300.0000 mg | ORAL_TABLET | Freq: Two times a day (BID) | ORAL | 1 refills | Status: DC

## 2018-07-20 NOTE — Patient Instructions (Addendum)
Goals    . Exercise 3x per week (30 min per time)    . Peak Blood Glucose<130      Please make sure you get your annual diabetic eye exam    Low-Purine Eating Plan A low-purine eating plan involves making food choices to limit your intake of purine. Purine is a kind of uric acid. Too much uric acid in your blood can cause certain conditions, such as gout and kidney stones. Eating a low-purine diet can help control these conditions. What are tips for following this plan? Reading food labels   Avoid foods with saturated or Trans fat.  Check the ingredient list of grains-based foods, such as bread and cereal, to make sure that they contain whole grains.  Check the ingredient list of sauces or soups to make sure they do not contain meat or fish.  When choosing soft drinks, check the ingredient list to make sure they do not contain high-fructose corn syrup. Shopping  Buy plenty of fresh fruits and vegetables.  Avoid buying canned or fresh fish.  Buy dairy products labeled as low-fat or nonfat.  Avoid buying premade or processed foods. These foods are often high in fat, salt (sodium), and added sugar. Cooking  Use olive oil instead of butter when cooking. Oils like olive oil, canola oil, and sunflower oil contain healthy fats. Meal planning  Learn which foods do or do not affect you. If you find out that a food tends to cause your gout symptoms to flare up, avoid eating that food. You can enjoy foods that do not cause problems. If you have any questions about a food item, talk with your dietitian or health care provider.  Limit foods high in fat, especially saturated fat. Fat makes it harder for your body to get rid of uric acid.  Choose foods that are lower in fat and are lean sources of protein. General guidelines  Limit alcohol intake to no more than 1 drink a day for nonpregnant women and 2 drinks a day for men. One drink equals 12 oz of beer, 5 oz of wine, or 1 oz of hard  liquor. Alcohol can affect the way your body gets rid of uric acid.  Drink plenty of water to keep your urine clear or pale yellow. Fluids can help remove uric acid from your body.  If directed by your health care provider, take a vitamin C supplement.  Work with your health care provider and dietitian to develop a plan to achieve or maintain a healthy weight. Losing weight can help reduce uric acid in your blood. What foods are recommended? The items listed may not be a complete list. Talk with your dietitian about what dietary choices are best for you. Foods low in purines Foods low in purines do not need to be limited. These include:  All fruits.  All low-purine vegetables, pickles, and olives.  Breads, pasta, rice, cornbread, and popcorn. Cake and other baked goods.  All dairy foods.  Eggs, nuts, and nut butters.  Spices and condiments, such as salt, herbs, and vinegar.  Plant oils, butter, and margarine.  Water, sugar-free soft drinks, tea, coffee, and cocoa.  Vegetable-based soups, broths, sauces, and gravies. Foods moderate in purines Foods moderate in purines should be limited to the amounts listed.   cup of asparagus, cauliflower, spinach, mushrooms, or green peas, each day.  2/3 cup uncooked oatmeal, each day.   cup dry wheat bran or wheat germ, each day.  2-3 ounces of  meat or poultry, each day.  4-6 ounces of shellfish, such as crab, lobster, oysters, or shrimp, each day.  1 cup cooked beans, peas, or lentils, each day.  Soup, broths, or bouillon made from meat or fish. Limit these foods as much as possible. What foods are not recommended? The items listed may not be a complete list. Talk with your dietitian about what dietary choices are best for you. Limit your intake of foods high in purines, including:  Beer and other alcohol.  Meat-based gravy or sauce.  Canned or fresh fish, such as: ? Anchovies, sardines, herring, and tuna. ? Mussels and  scallops. ? Codfish, trout, and haddock.  Cody Zamora.  Organ meats, such as: ? Liver or kidney. ? Tripe. ? Sweetbreads (thymus gland or pancreas).  Wild Clinical biochemist.  Yeast or yeast extract supplements.  Drinks sweetened with high-fructose corn syrup. Summary  Eating a low-purine diet can help control conditions caused by too much uric acid in the body, such as gout or kidney stones.  Choose low-purine foods, limit alcohol, and limit foods high in fat.  You will learn over time which foods do or do not affect you. If you find out that a food tends to cause your gout symptoms to flare up, avoid eating that food. This information is not intended to replace advice given to you by your health care provider. Make sure you discuss any questions you have with your health care provider. Document Released: 07/31/2010 Document Revised: 05/19/2016 Document Reviewed: 05/19/2016 Elsevier Interactive Patient Education  2019 [employer].

## 2018-07-21 ENCOUNTER — Other Ambulatory Visit: Payer: Self-pay | Admitting: Internal Medicine

## 2018-07-21 DIAGNOSIS — M1A9XX0 Chronic gout, unspecified, without tophus (tophi): Secondary | ICD-10-CM

## 2018-07-21 LAB — COMPLETE METABOLIC PANEL WITH GFR
AG Ratio: 1.5 (calc) (ref 1.0–2.5)
ALT: 29 U/L (ref 9–46)
AST: 26 U/L (ref 10–35)
Albumin: 4.5 g/dL (ref 3.6–5.1)
Alkaline phosphatase (APISO): 78 U/L (ref 35–144)
BUN: 16 mg/dL (ref 7–25)
CO2: 27 mmol/L (ref 20–32)
Calcium: 9.9 mg/dL (ref 8.6–10.3)
Chloride: 105 mmol/L (ref 98–110)
Creat: 1.08 mg/dL (ref 0.70–1.25)
GFR, Est African American: 81 mL/min/{1.73_m2} (ref >=60)
GFR, Est Non African American: 70 mL/min/{1.73_m2} (ref >=60)
Globulin: 3 g/dL (calc) (ref 1.9–3.7)
Glucose, Bld: 115 mg/dL — ABNORMAL HIGH (ref 65–99)
Potassium: 4.1 mmol/L (ref 3.5–5.3)
Sodium: 139 mmol/L (ref 135–146)
Total Bilirubin: 0.3 mg/dL (ref 0.2–1.2)
Total Protein: 7.5 g/dL (ref 6.1–8.1)

## 2018-07-21 LAB — CBC WITH DIFFERENTIAL/PLATELET
Absolute Monocytes: 553 cells/uL (ref 200–950)
Basophils Absolute: 43 cells/uL (ref 0–200)
Basophils Relative: 0.5 %
Eosinophils Absolute: 204 cells/uL (ref 15–500)
Eosinophils Relative: 2.4 %
HCT: 43.8 % (ref 38.5–50.0)
Hemoglobin: 14.7 g/dL (ref 13.2–17.1)
Lymphs Abs: 3545 cells/uL (ref 850–3900)
MCH: 29.9 pg (ref 27.0–33.0)
MCHC: 33.6 g/dL (ref 32.0–36.0)
MCV: 89.2 fL (ref 80.0–100.0)
MPV: 9.5 fL (ref 7.5–12.5)
Monocytes Relative: 6.5 %
Neutro Abs: 4157 cells/uL (ref 1500–7800)
Neutrophils Relative %: 48.9 %
Platelets: 254 10*3/uL (ref 140–400)
RBC: 4.91 10*6/uL (ref 4.20–5.80)
RDW: 14.4 % (ref 11.0–15.0)
Total Lymphocyte: 41.7 %
WBC: 8.5 10*3/uL (ref 3.8–10.8)

## 2018-07-21 LAB — LIPID PANEL
Cholesterol: 146 mg/dL (ref <200)
HDL: 28 mg/dL — ABNORMAL LOW (ref >=40)
LDL Cholesterol (Calc): 87 mg/dL (calc)
Non-HDL Cholesterol (Calc): 118 mg/dL (calc) (ref <130)
Total CHOL/HDL Ratio: 5.2 (calc) — ABNORMAL HIGH (ref <5.0)
Triglycerides: 211 mg/dL — ABNORMAL HIGH (ref <150)

## 2018-07-21 LAB — HEMOGLOBIN A1C
Hgb A1c MFr Bld: 6.6 % of total Hgb — ABNORMAL HIGH (ref <5.7)
Mean Plasma Glucose: 143 (calc)
eAG (mmol/L): 7.9 (calc)

## 2018-07-21 LAB — TSH: TSH: 1.35 mIU/L (ref 0.40–4.50)

## 2018-07-21 LAB — URIC ACID: Uric Acid, Serum: 4.2 mg/dL (ref 4.0–8.0)

## 2018-07-21 LAB — MAGNESIUM: Magnesium: 1.8 mg/dL (ref 1.5–2.5)

## 2018-07-21 MED ORDER — ALLOPURINOL 300 MG PO TABS: ORAL_TABLET | ORAL | 1 refills | Status: DC

## 2018-10-09 ENCOUNTER — Telehealth: Payer: Self-pay

## 2018-10-09 DIAGNOSIS — M19079 Primary osteoarthritis, unspecified ankle and foot: Secondary | ICD-10-CM

## 2018-10-09 MED ORDER — VALSARTAN 320 MG PO TABS: 320.0000 mg | ORAL_TABLET | Freq: Every day | ORAL | 3 refills | Status: DC

## 2018-10-09 MED ORDER — DICLOFENAC SODIUM 1 % TD GEL: 4.0000 g | Freq: Four times a day (QID) | TRANSDERMAL | 3 refills | Status: DC

## 2018-10-09 NOTE — Telephone Encounter (Signed)
Refill request for Valsartan and Diclofenac

## 2018-10-24 ENCOUNTER — Encounter: Payer: Self-pay | Admitting: Internal Medicine

## 2018-10-24 NOTE — Patient Instructions (Signed)

## 2018-10-24 NOTE — Progress Notes (Signed)
History of Present Illness:       This very nice 68 y.o. MBM presents for 6 month follow up with HTN, HLD, T2_DM and Vitamin D Deficiency. Patient has Gout controlled on his meds.      Patient is treated for HTN (1996) & BP has been controlled at home. Today's BP is at goal - 136/86. Patient has had no complaints of any cardiac type chest pain, palpitations, dyspnea / orthopnea / PND, dizziness, claudication, or dependent edema.      Hyperlipidemia is controlled with diet & meds. Patient denies myalgias or other med SE's. Last Lipids were at goal: Lab Results  Component Value Date   CHOL 128 10/25/2018   HDL 31 (L) 10/25/2018   LDLCALC 75 10/25/2018   TRIG 140 10/25/2018   CHOLHDL 4.1 10/25/2018        Also, the patient has Moderate Obesity (BMI 33+) and history of T2_NIDDM (2011) w/CKD2 and has had no symptoms of reactive hypoglycemia, diabetic polys, paresthesias or visual blurring.  Last A1c was not at goal: Lab Results  Component Value Date   HGBA1C 6.6 (H) 10/25/2018        Further, the patient also has history of Vitamin D Deficiency ("11" / 2008) and supplements vitamin D without any suspected side-effects. Last vitamin D was at goal: Lab Results  Component Value Date   VD25OH 68 10/25/2018   Current Outpatient Medications on File Prior to Visit  Medication Sig  . allopurinol (ZYLOPRIM) 300 MG tablet Take ONLY 1 tablet Daily to prevent Gout (maximum 1 tablet /day)  . aspirin 81 MG tablet Take 81 mg by mouth daily.    . bisoprolol-hydrochlorothiazide (ZIAC) 5-6.25 MG tablet Take 1 tablet by mouth daily. for blood pressure  . Cholecalciferol (VITAMIN D3) 2000 UNITS capsule Take 8,000 Units by mouth daily.  . diclofenac sodium (VOLTAREN) 1 % GEL Apply 4 g topically 4 (four) times daily.  Marland Kitchen ezetimibe (ZETIA) 10 MG tablet Take 1 tablet (10 mg total) by mouth daily.  . fluticasone (FLONASE) 50 MCG/ACT nasal spray Place 1 spray into both nostrils daily as needed for allergies.   . metFORMIN (GLUCOPHAGE-XR) 500 MG 24 hr tablet TAKE 2 TABLETS TWICE A DAY WITH MEALS AS DIRECTED  . Multiple Vitamins-Minerals (MULTIVITAMIN WITH MINERALS) tablet Take 1 tablet by mouth daily.    . Omega-3 Fatty Acids (FISH OIL) 1000 MG CAPS Take 2 capsules by mouth daily.  . valsartan (DIOVAN) 320 MG tablet Take 1 tablet (320 mg total) by mouth daily.  . Wheat Dextrin (BENEFIBER DRINK MIX PO) Take 1 Package by mouth daily.    No current facility-administered medications on file prior to visit.    Allergies  Allergen Reactions  . Cialis [Tadalafil] Other (See Comments)    Hot flashes  . Fructose Nausea And Vomiting  . Morphine And Related Itching   PMHx:   Past Medical History:  Diagnosis Date  . ED (erectile dysfunction) of organic origin   . History of colon polyps   . History of kidney stones   . History of positive PPD    06/ 2006--  CXR NORMAL  . History of prostate cancer    DEC 2008--  S/P  RADICAL PROSTATECTOMY  . Hyperlipidemia   . Hypertension   . Hypogonadism male   . Mixed hyperlipidemia   . Nocturia   . Sigmoid diverticulosis    MILD  . Type 2 diabetes mellitus ([place])    Immunization History  Administered  Date(s) Administered  . DTaP 02/17/2002  . Influenza Whole 01/08/2013  . Influenza, High Dose Seasonal PF 02/26/2014, 12/24/2015, 03/21/2017, 04/14/2018  . Pneumococcal Conjugate-13 07/23/2015  . Pneumococcal Polysaccharide-23 02/18/1996, 01/03/2018  . Tdap 06/29/2011  . Zoster 03/23/2011   Past Surgical History:  Procedure Laterality Date  . ACHILLES TENDON REPAIR Right 1989  . COLONOSCOPY N/A last one 10-28-2010    due 10 yr f/u in 2022 - [facility]  . FINGER AMPUTATION  age 72   left [place]  . NEGATIVE SLEEP STUDY  2010  per pt  . PENILE PROSTHESIS IMPLANT N/A 10/28/2014   Procedure: IMPLANTATION 3 PIECE PENILE INFLATABLE PROTHESIS/COLOPLAST SCROTAL APPROACH;  Surgeon: Kathie Rhodes, MD;  Location: [place];  Service: Urology;   Laterality: N/A;  . PERCUTANEOUS NEPHROLITHOTRIPSY Left 04-04-2009// second look 04-15-2009  . ROBOT ASSISTED LAPAROSCOPIC RADICAL PROSTATECTOMY  Dec 2008  . SHOULDER OPEN ROTATOR CUFF REPAIR Right 05-11-2006  . URETEROLITHOTOMY  2002   FHx:    Reviewed / unchanged  SHx:    Reviewed / unchanged   Systems Review:  Constitutional: Denies fever, chills, wt changes, headaches, insomnia, fatigue, night sweats, change in appetite. Eyes: Denies redness, blurred vision, diplopia, discharge, itchy, watery eyes.  ENT: Denies discharge, congestion, post nasal drip, epistaxis, sore throat, earache, hearing loss, dental pain, tinnitus, vertigo, sinus pain, snoring.  CV: Denies chest pain, palpitations, irregular heartbeat, syncope, dyspnea, diaphoresis, orthopnea, PND, claudication or edema. Respiratory: denies cough, dyspnea, DOE, pleurisy, hoarseness, laryngitis, wheezing.  Gastrointestinal: Denies dysphagia, odynophagia, heartburn, reflux, water brash, abdominal pain or cramps, nausea, vomiting, bloating, diarrhea, constipation, hematemesis, melena, hematochezia  or hemorrhoids. Genitourinary: Denies dysuria, frequency, urgency, nocturia, hesitancy, discharge, hematuria or flank pain. Musculoskeletal: Denies arthralgias, myalgias, stiffness, jt. swelling, pain, limping or strain/sprain.  Skin: Denies pruritus, rash, hives, warts, acne, eczema or change in skin lesion(s). Neuro: No weakness, tremor, incoordination, spasms, paresthesia or pain. Psychiatric: Denies confusion, memory loss or sensory loss. Endo: Denies change in weight, skin or hair change.  Heme/Lymph: No excessive bleeding, bruising or enlarged lymph nodes.  Physical Exam  BP 136/86   Pulse 64   Temp (!) 97.1 F (36.2 C)   Resp 16   Ht 5' 5.5" (1.664 m)   Wt 202 lb 11.2 oz (91.9 kg)   BMI 33.22 kg/m   Appears over nourished, well groomed  and in no distress.  Eyes: PERRLA, EOMs, conjunctiva no swelling or erythema.  Sinuses: No frontal/maxillary tenderness ENT/Mouth: EAC's clear, TM's nl w/o erythema, bulging. Nares clear w/o erythema, swelling, exudates. Oropharynx clear without erythema or exudates. Oral hygiene is good. Tongue normal, non obstructing. Hearing intact.  Neck: Supple. Thyroid not palpable. Car 2+/2+ without bruits, nodes or JVD. Chest: Respirations nl with BS clear & equal w/o rales, rhonchi, wheezing or stridor.  Cor: Heart sounds normal w/ regular rate and rhythm without sig. murmurs, gallops, clicks or rubs. Peripheral pulses normal and equal  without edema.  Abdomen: Soft & bowel sounds normal. Non-tender w/o guarding, rebound, hernias, masses or organomegaly.  Lymphatics: Unremarkable.  Musculoskeletal: Full ROM all peripheral extremities, joint stability, 5/5 strength and normal gait.  Skin: Warm, dry without exposed rashes, lesions or ecchymosis apparent.  Neuro: Cranial nerves intact, reflexes equal bilaterally. Sensory-motor testing grossly intact. Tendon reflexes grossly intact.  Pysch: Alert & oriented x 3.  Insight and judgement nl & appropriate. No ideations.  Assessment and Plan:  1. Essential hypertension  - Continue medication, monitor blood pressure at home.  - Continue  DASH diet.  Reminder to go to the ER if any CP,  SOB, nausea, dizziness, severe HA, changes vision/speech.  - CBC with Differential/Platelet - COMPLETE METABOLIC PANEL WITH GFR - Magnesium - TSH  2. Hyperlipidemia, mixed  - Continue diet/meds, exercise,& lifestyle modifications.  - Continue monitor periodic cholesterol/liver & renal functions   - Lipid panel - TSH  3. Type 2 diabetes mellitus with stage 2 chronic kidney disease, without long-term current use of insulin ([name])  - Continue diet, exercise  - Lifestyle modifications.  - Monitor appropriate labs.  - Hemoglobin A1c - Insulin, random  - metFORMIN (GLUCOPHAGE) 500 MG tablet; Take  2 tablets 2 x /day with meals for Diabetes   Dispense: 360 tablet; Refill: 3  4. Vitamin D deficiency  - Continue supplementation.  - VITAMIN D 25 Hydroxyl  5. Gout  - Uric acid  6. Medication management  - CBC with Differential/Platelet - COMPLETE METABOLIC PANEL WITH GFR - Magnesium - Lipid panel - TSH - Hemoglobin A1c - VITAMIN D 25 Hydroxyl - Uric acid - Insulin, random      Discussed  regular exercise, BP monitoring, weight control to achieve/maintain BMI less than 25 and discussed med and SE's. Recommended labs to assess and monitor clinical status with further disposition pending results of labs.  I discussed the assessment and treatment plan with the patient. The patient was provided an opportunity to ask questions and all were answered. The patient agreed with the plan and demonstrated an understanding of the instructions. I provided over 28 minutes of exam, counseling, chart review and  complex critical decision making.  Kirtland Bouchard, MD

## 2018-10-25 ENCOUNTER — Ambulatory Visit (INDEPENDENT_AMBULATORY_CARE_PROVIDER_SITE_OTHER): Payer: Medicare Other | Admitting: Internal Medicine

## 2018-10-25 ENCOUNTER — Other Ambulatory Visit: Payer: Self-pay

## 2018-10-25 VITALS — BP 136/86 | HR 64 | Temp 97.1°F | Resp 16 | Ht 65.5 in | Wt 202.7 lb

## 2018-10-25 DIAGNOSIS — E559 Vitamin D deficiency, unspecified: Secondary | ICD-10-CM | POA: Diagnosis not present

## 2018-10-25 DIAGNOSIS — E1122 Type 2 diabetes mellitus with diabetic chronic kidney disease: Secondary | ICD-10-CM

## 2018-10-25 DIAGNOSIS — I10 Essential (primary) hypertension: Secondary | ICD-10-CM

## 2018-10-25 DIAGNOSIS — N182 Chronic kidney disease, stage 2 (mild): Secondary | ICD-10-CM

## 2018-10-25 DIAGNOSIS — M109 Gout, unspecified: Secondary | ICD-10-CM

## 2018-10-25 DIAGNOSIS — E782 Mixed hyperlipidemia: Secondary | ICD-10-CM

## 2018-10-25 DIAGNOSIS — Z79899 Other long term (current) drug therapy: Secondary | ICD-10-CM | POA: Diagnosis not present

## 2018-10-26 LAB — CBC WITH DIFFERENTIAL/PLATELET
Absolute Monocytes: 470 cells/uL (ref 200–950)
Basophils Absolute: 34 cells/uL (ref 0–200)
Basophils Relative: 0.4 %
Eosinophils Absolute: 193 cells/uL (ref 15–500)
Eosinophils Relative: 2.3 %
HCT: 42.6 % (ref 38.5–50.0)
Hemoglobin: 14.4 g/dL (ref 13.2–17.1)
Lymphs Abs: 3268 cells/uL (ref 850–3900)
MCH: 30.7 pg (ref 27.0–33.0)
MCHC: 33.8 g/dL (ref 32.0–36.0)
MCV: 90.8 fL (ref 80.0–100.0)
MPV: 9.2 fL (ref 7.5–12.5)
Monocytes Relative: 5.6 %
Neutro Abs: 4435 cells/uL (ref 1500–7800)
Neutrophils Relative %: 52.8 %
Platelets: 242 10*3/uL (ref 140–400)
RBC: 4.69 10*6/uL (ref 4.20–5.80)
RDW: 14.1 % (ref 11.0–15.0)
Total Lymphocyte: 38.9 %
WBC: 8.4 10*3/uL (ref 3.8–10.8)

## 2018-10-26 LAB — HEMOGLOBIN A1C
Hgb A1c MFr Bld: 6.6 % of total Hgb — ABNORMAL HIGH (ref <5.7)
Mean Plasma Glucose: 143 (calc)
eAG (mmol/L): 7.9 (calc)

## 2018-10-26 LAB — COMPLETE METABOLIC PANEL WITH GFR
AG Ratio: 1.6 (calc) (ref 1.0–2.5)
ALT: 29 U/L (ref 9–46)
AST: 29 U/L (ref 10–35)
Albumin: 4.4 g/dL (ref 3.6–5.1)
Alkaline phosphatase (APISO): 64 U/L (ref 35–144)
BUN: 15 mg/dL (ref 7–25)
CO2: 26 mmol/L (ref 20–32)
Calcium: 9.6 mg/dL (ref 8.6–10.3)
Chloride: 103 mmol/L (ref 98–110)
Creat: 1.1 mg/dL (ref 0.70–1.25)
GFR, Est African American: 79 mL/min/{1.73_m2} (ref >=60)
GFR, Est Non African American: 68 mL/min/{1.73_m2} (ref >=60)
Globulin: 2.8 g/dL (calc) (ref 1.9–3.7)
Glucose, Bld: 108 mg/dL — ABNORMAL HIGH (ref 65–99)
Potassium: 4.2 mmol/L (ref 3.5–5.3)
Sodium: 137 mmol/L (ref 135–146)
Total Bilirubin: 0.3 mg/dL (ref 0.2–1.2)
Total Protein: 7.2 g/dL (ref 6.1–8.1)

## 2018-10-26 LAB — VITAMIN D 25 HYDROXY (VIT D DEFICIENCY, FRACTURES): Vit D, 25-Hydroxy: 68 ng/mL (ref 30–100)

## 2018-10-26 LAB — LIPID PANEL
Cholesterol: 128 mg/dL (ref <200)
HDL: 31 mg/dL — ABNORMAL LOW (ref >=40)
LDL Cholesterol (Calc): 75 mg/dL (calc)
Non-HDL Cholesterol (Calc): 97 mg/dL (calc) (ref <130)
Total CHOL/HDL Ratio: 4.1 (calc) (ref <5.0)
Triglycerides: 140 mg/dL (ref <150)

## 2018-10-26 LAB — URIC ACID: Uric Acid, Serum: 4.4 mg/dL (ref 4.0–8.0)

## 2018-10-26 LAB — MAGNESIUM: Magnesium: 1.9 mg/dL (ref 1.5–2.5)

## 2018-10-26 LAB — TSH: TSH: 1.71 mIU/L (ref 0.40–4.50)

## 2018-10-26 LAB — INSULIN, RANDOM: Insulin: 19.5 u[IU]/mL

## 2018-10-29 ENCOUNTER — Encounter: Payer: Self-pay | Admitting: Internal Medicine

## 2018-10-29 MED ORDER — METFORMIN HCL 500 MG PO TABS: ORAL_TABLET | ORAL | 3 refills | Status: DC

## 2018-11-24 DIAGNOSIS — R8279 Other abnormal findings on microbiological examination of urine: Secondary | ICD-10-CM | POA: Diagnosis not present

## 2018-11-24 DIAGNOSIS — N200 Calculus of kidney: Secondary | ICD-10-CM | POA: Diagnosis not present

## 2018-12-27 ENCOUNTER — Ambulatory Visit (INDEPENDENT_AMBULATORY_CARE_PROVIDER_SITE_OTHER): Payer: Medicare Other

## 2018-12-27 ENCOUNTER — Other Ambulatory Visit: Payer: Self-pay

## 2018-12-27 DIAGNOSIS — Z23 Encounter for immunization: Secondary | ICD-10-CM | POA: Diagnosis not present

## 2018-12-27 NOTE — Progress Notes (Signed)
Patient presents to the office for HD Flu Vaccine. Vaccine administered to Right Deltoid without complication. Temperature taken and recorded.

## 2019-01-05 ENCOUNTER — Ambulatory Visit: Payer: Self-pay | Admitting: Physician Assistant

## 2019-01-09 ENCOUNTER — Other Ambulatory Visit: Payer: Self-pay | Admitting: Urology

## 2019-01-22 NOTE — Progress Notes (Signed)
MEDICARE ANNUAL WELLNESS VISIT AND OV  Assessment:   Assessment and Plan:   Encounter for Medicare annual wellness exam  1 year  Type 2 diabetes mellitus with stage 2 chronic kidney disease, without long-term current use of insulin (Cody Zamora) -     Hemoglobin A1c Discussed general issues about diabetes pathophysiology and management., Educational material distributed., Suggested low cholesterol diet., Encouraged aerobic exercise., Discussed foot care., Reminded to get yearly retinal exam. Get eye exam, numbers given  Hyperlipidemia associated with type 2 diabetes mellitus (Cody Zamora) -     Lipid panel -     ezetimibe (ZETIA) 10 MG tablet; Take 1 tablet (10 mg total) by mouth daily. check lipids, goal less than 70- may need to add on statin decrease fatty foods increase activity.   CKD stage 2 due to type 2 diabetes mellitus (Cody Zamora) Increase fluids, avoid NSAIDS, monitor sugars, will monitor  Depression, unspecified depression type - continue medications, stress management techniques discussed, increase water, good sleep hygiene discussed, increase exercise, and increase veggies.   Chronic gout involving toe without tophus, unspecified cause, unspecified laterality Gout- recheck Uric acid as needed, Diet discussed, continue medications.  Essential hypertension - continue medications, DASH diet, exercise and monitor at home. Call if greater than 130/80.  -     CBC with Differential/Platelet -     COMPLETE METABOLIC PANEL WITH GFR -     TSH -     bisoprolol-hydrochlorothiazide (ZIAC) 5-6.25 MG tablet; Take 1 tablet by mouth daily. for blood pressure  Coronary atherosclerosis due to calcified coronary lesion Control blood pressure, cholesterol, glucose, increase exercise.   Nephrolithiasis Going to have surgery on Oct 19th  Medication management -     Magnesium  Vitamin D deficiency -     VITAMIN D 25 Hydroxy (Vit-D Deficiency, Fractures)  Erectile dysfunction following radical  prostatectomy Follow up urology  Generalized anxiety disorder -continue medications, stress management techniques discussed, increase water, good sleep hygiene discussed, increase exercise, and increase veggies.   Obesity (BMI 30.0-34.9) - follow up 3 months for progress monitoring - increase veggies, decrease carbs - long discussion about weight loss, diet, and exercise  Acute pain of right shoulder -     Ambulatory referral to Orthopedic Surgery - likely impingment, refer ortho for injection/evaluation     Continue diet and meds as discussed. Further disposition pending results of labs. Discussed med's effects and SE's.    Over 30 minutes of exam, counseling, chart review, and critical decision making was performed  Future Appointments  Date Time Provider [name]  02/01/2019  8:00 AM [name] 2 [facility] None  02/05/2019  8:00 AM [name] ROOM [name] None  02/05/2019  9:30 AM [name] 1 [name] [name]  05/04/2019 10:00 AM [name], MD [facility] None     Plan:   During the course of the visit the patient was educated and counseled about appropriate screening and preventive services including:    Pneumococcal vaccine   Influenza vaccine  Td vaccine  Screening electrocardiogram  Bone densitometry screening  Colorectal cancer screening  Diabetes screening  Glaucoma screening  Nutrition counseling   Advanced directives: requested   HPI 68 y.o.  AA male  presents for 3 month follow up on hypertension, cholesterol, diabetes and vitamin D deficiency and wellness visit. Cody Zamora   His blood pressure has been controlled at home, today his BP is BP: 124/80.  Wife is Cody Zamora, s/p kidney transplant.  He will be having nephrolithotomy Oct 19th with Cody Zamora  due to pain.  He has been having right shoulder pain, has seen Cody Zamora for left shoulder in 2018. No injury. Worse with lying down or pressure on that shoulder, feels a "knot" on his  shoulder. He can have good abduction pain with external/internal rotation, "feels like a pinch". No neck pain.  S/p rotator cuff repair in 2008.   BMI is Body mass index is 32.28 kg/m., he is working on diet and exercise. Wt Readings from Last 3 Encounters:  01/25/19 197 lb (89.4 kg)  10/25/18 202 lb 11.2 oz (91.9 kg)  07/20/18 198 lb (89.8 kg)   He has a personal history of prostate cancer s/p radial proctectomy in 2008.   He does not workout. He denies chest pain, shortness of breath, dizziness.  He is on cholesterol medication and denies myalgias. His cholesterol is at goal. The cholesterol was:   Lab Results  Component Value Date   CHOL 128 10/25/2018   HDL 31 (L) 10/25/2018   LDLCALC 75 10/25/2018   TRIG 140 10/25/2018   CHOLHDL 4.1 10/25/2018    He has been working on diet and exercise for diabetes  with diabetic chronic kidney disease and he is on ACE Hyperlipidemia on zetia  he is on bASA denies  paresthesia of the feet, polydipsia, polyuria and visual disturbances. Last A1C was:  Lab Results  Component Value Date   HGBA1C 6.6 (H) 10/25/2018   Lab Results  Component Value Date   GFRAA 79 10/25/2018    Patient is on Vitamin D supplement. Lab Results  Component Value Date   VD25OH 68 10/25/2018   Patient is on allopurinol for gout and does not report a recent flare.  Lab Results  Component Value Date   LABURIC 4.4 10/25/2018   Current Medications:  Current Outpatient Medications on File Prior to Visit  Medication Sig Dispense Refill  . allopurinol (ZYLOPRIM) 300 MG tablet Take ONLY 1 tablet Daily to prevent Gout (maximum 1 tablet /day) 90 tablet 1  . aspirin 81 MG tablet Take 81 mg by mouth daily.      . bisoprolol-hydrochlorothiazide (ZIAC) 5-6.25 MG tablet Take 1 tablet by mouth daily. for blood pressure 90 tablet 1  . Cholecalciferol (VITAMIN D3) 2000 UNITS capsule Take 8,000 Units by mouth daily.    . diclofenac sodium (VOLTAREN) 1 % GEL Apply 4 g  topically 4 (four) times daily. 100 g 3  . ezetimibe (ZETIA) 10 MG tablet Take 1 tablet (10 mg total) by mouth daily. 90 tablet 3  . fluticasone (FLONASE) 50 MCG/ACT nasal spray Place 1 spray into both nostrils daily as needed for allergies. 20 g 1  . metFORMIN (GLUCOPHAGE) 500 MG tablet Take  2 tablets 2 x /day with meals for Diabetes 360 tablet 3  . Multiple Vitamins-Minerals (MULTIVITAMIN WITH MINERALS) tablet Take 1 tablet by mouth daily.      . Omega-3 Fatty Acids (FISH OIL) 1000 MG CAPS Take 2 capsules by mouth daily.    . valsartan (DIOVAN) 320 MG tablet Take 1 tablet (320 mg total) by mouth daily. 90 tablet 3  . Wheat Dextrin (BENEFIBER DRINK MIX PO) Take 1 Package by mouth daily.      No current facility-administered medications on file prior to visit.    Medical History:  Past Medical History:  Diagnosis Date  . ED (erectile dysfunction) of organic origin   . History of colon polyps   . History of kidney stones   . History of positive PPD  06/ 2006--  CXR NORMAL  . History of prostate cancer    DEC 2008--  S/P  RADICAL PROSTATECTOMY  . Hyperlipidemia   . Hypertension   . Hypogonadism male   . Mixed hyperlipidemia   . Nocturia   . Sigmoid diverticulosis    MILD  . Type 2 diabetes mellitus ([place])    Preventative care: Immunization History  Administered Date(s) Administered  . DTaP 02/17/2002  . Influenza Whole 01/08/2013  . Influenza, High Dose Seasonal PF 02/26/2014, 12/24/2015, 03/21/2017, 04/14/2018, 12/27/2018  . Pneumococcal Conjugate-13 07/23/2015  . Pneumococcal Polysaccharide-23 02/18/1996, 01/03/2018  . Tdap 06/29/2011  . Zoster 03/23/2011    Last colonoscopy: 10/28/2010 - Dr Deatra Ina - 46 yr f/u.  EGD 2012 DEXA 2011 CXR 2013- never smoked  Tdap 2013 Pneumonia 2019 Prevnar 13: 2017 Influenza 2020 Shingles 2012  Names of Other Physician/Practitioners you currently use: 1. [facility] here for primary care 2. Dr  Frederico Hamman, eye doctor,  2 years 3. Dr Reola Calkins, dentist, last visit Aug 2019    Allergies Allergies  Allergen Reactions  . Cialis [Tadalafil] Other (See Comments)    Hot flashes  . Fructose Nausea And Vomiting  . Morphine And Related Itching    SURGICAL HISTORY He  has a past surgical history that includes Finger amputation (age 52); Robot assisted laparoscopic radical prostatectomy (Dec 2008); Colonoscopy (N/A, last one 10-28-2010); Percutaneous nephrolithotripsy (Left, 04-04-2009// second look 04-15-2009); Ureterolithotomy (2002); Shoulder open rotator cuff repair (Right, 05-11-2006); Achilles tendon repair (Right, 1989); NEGATIVE SLEEP STUDY (2010  per pt); and Penile prosthesis implant (N/A, 10/28/2014). FAMILY HISTORY His family history includes Alcohol abuse in his father; Arthritis in his mother; Diabetes in his mother; Heart disease in his mother; Hypertension in his father. SOCIAL HISTORY He  reports that he has never smoked. He has never used smokeless tobacco. He reports that he does not drink alcohol or use drugs.  MEDICARE WELLNESS OBJECTIVES: Physical activity: Current Exercise Habits: The patient does not participate in regular exercise at present(walking some and does house work/yard work) Cardiac risk factors: Cardiac Risk Factors include: advanced age (>83men, >55 women);diabetes mellitus;dyslipidemia;hypertension;male gender;sedentary lifestyle;obesity (BMI >30kg/m2);family history of premature cardiovascular disease(heart disease in his mother) Depression/mood screen:   Depression screen [facility] 2/9 01/25/2019  Decreased Interest 0  Down, Depressed, Hopeless 0  PHQ - 2 Score 0    ADLs:  In your present state of health, do you have any difficulty performing the following activities: 01/25/2019 10/29/2018  Hearing? N N  Vision? N N  Difficulty concentrating or making decisions? N N  Walking or climbing stairs? N N  Dressing or bathing? N N  Doing errands, shopping? N N   Preparing Food and eating ? N -  Using the Toilet? N -  In the past six months, have you accidently leaked urine? N -  Do you have problems with loss of bowel control? N -  Managing your Medications? N -  Managing your Finances? N -  Housekeeping or managing your Housekeeping? N -  Some recent data might be hidden     Cognitive Testing  Alert? Yes  Normal Appearance?Yes  Oriented to person? Yes  Place? Yes   Time? Yes  Recall of three objects?  Yes  Can perform simple calculations? Yes  Displays appropriate judgment?Yes  Can read the correct time from a watch face?Yes  EOL planning: Does Patient Have a Medical Advance Directive?: No Does patient want to make changes to medical advance  directive?: No - Patient declined(working on one at home)  Review of Systems:  Review of Systems  Constitutional: Negative.   HENT: Negative.   Eyes: Negative.   Respiratory: Negative.   Cardiovascular: Negative.   Gastrointestinal: Negative.  Negative for abdominal pain, blood in stool, constipation, diarrhea, heartburn, melena, nausea and vomiting.  Genitourinary: Negative.   Musculoskeletal: Positive for joint pain.  Skin: Negative.   Neurological: Negative.   Endo/Heme/Allergies: Negative.   Psychiatric/Behavioral: Negative for depression, hallucinations, memory loss, substance abuse and suicidal ideas. The patient is not nervous/anxious and does not have insomnia.    Physical Exam: BP 124/80   Pulse 88   Temp 97.7 F (36.5 C)   Ht 5' 5.5" (1.664 m)   Wt 197 lb (89.4 kg)   SpO2 97%   BMI 32.28 kg/m  Wt Readings from Last 3 Encounters:  01/25/19 197 lb (89.4 kg)  10/25/18 202 lb 11.2 oz (91.9 kg)  07/20/18 198 lb (89.8 kg)   General Appearance: Well nourished, in no apparent distress. Eyes: PERRLA, EOMs, conjunctiva no swelling or erythema Sinuses: No Frontal/maxillary tenderness ENT/Mouth: Ext aud canals clear, TMs without erythema, bulging. No erythema, swelling, or exudate  on post pharynx.  Tonsils not swollen or erythematous. Hearing decreased but has hearing aids Neck: Supple, thyroid normal.  Respiratory: Respiratory effort normal, BS equal bilaterally without rales, rhonchi, wheezing or stridor.  Cardio: RRR with no MRGs. Brisk peripheral pulses without edema.  Abdomen: Soft, + BS.  Non tender, no guarding, rebound, hernias, masses. Lymphatics: Non tender without lymphadenopathy.  Musculoskeletal: Full ROM, 5/5 strength, Normal gait.   shoulder without swelling or deformity, + empty can test, + pain with internal/external rotation, good abduction without pain. Good distal neurovascular exam.  Skin: Cody, dry without rashes, lesions, ecchymosis.  Neuro: Cranial nerves intact. No cerebellar symptoms.  Psych: Awake and oriented X 3, normal affect, Insight and Judgment appropriate.   Medicare Attestation I have personally reviewed: The patient's medical and social history Their use of alcohol, tobacco or illicit drugs Their current medications and supplements The patient's functional ability including ADLs,fall risks, home safety risks, cognitive, and hearing and visual impairment Diet and physical activities Evidence for depression or mood disorders  The patient's weight, height, BMI, and visual acuity have been recorded in the chart.  I have made referrals, counseling, and provided education to the patient based on review of the above and I have provided the patient with a written personalized care plan for preventive services.     Vicie Mutters, PA-C 10:23 AM [facility]

## 2019-01-25 ENCOUNTER — Ambulatory Visit (INDEPENDENT_AMBULATORY_CARE_PROVIDER_SITE_OTHER): Payer: Medicare Other | Admitting: Physician Assistant

## 2019-01-25 ENCOUNTER — Encounter: Payer: Self-pay | Admitting: Physician Assistant

## 2019-01-25 ENCOUNTER — Other Ambulatory Visit: Payer: Self-pay

## 2019-01-25 VITALS — BP 124/80 | HR 88 | Temp 97.7°F | Ht 65.5 in | Wt 197.0 lb

## 2019-01-25 DIAGNOSIS — E66811 Obesity, class 1: Secondary | ICD-10-CM

## 2019-01-25 DIAGNOSIS — E669 Obesity, unspecified: Secondary | ICD-10-CM

## 2019-01-25 DIAGNOSIS — M25511 Pain in right shoulder: Secondary | ICD-10-CM | POA: Diagnosis not present

## 2019-01-25 DIAGNOSIS — I10 Essential (primary) hypertension: Secondary | ICD-10-CM | POA: Diagnosis not present

## 2019-01-25 DIAGNOSIS — N182 Chronic kidney disease, stage 2 (mild): Secondary | ICD-10-CM

## 2019-01-25 DIAGNOSIS — E1122 Type 2 diabetes mellitus with diabetic chronic kidney disease: Secondary | ICD-10-CM | POA: Diagnosis not present

## 2019-01-25 DIAGNOSIS — I2584 Coronary atherosclerosis due to calcified coronary lesion: Secondary | ICD-10-CM

## 2019-01-25 DIAGNOSIS — R6889 Other general symptoms and signs: Secondary | ICD-10-CM | POA: Diagnosis not present

## 2019-01-25 DIAGNOSIS — E559 Vitamin D deficiency, unspecified: Secondary | ICD-10-CM | POA: Diagnosis not present

## 2019-01-25 DIAGNOSIS — F329 Major depressive disorder, single episode, unspecified: Secondary | ICD-10-CM

## 2019-01-25 DIAGNOSIS — N200 Calculus of kidney: Secondary | ICD-10-CM

## 2019-01-25 DIAGNOSIS — E785 Hyperlipidemia, unspecified: Secondary | ICD-10-CM | POA: Diagnosis not present

## 2019-01-25 DIAGNOSIS — N5231 Erectile dysfunction following radical prostatectomy: Secondary | ICD-10-CM

## 2019-01-25 DIAGNOSIS — Z79899 Other long term (current) drug therapy: Secondary | ICD-10-CM | POA: Diagnosis not present

## 2019-01-25 DIAGNOSIS — Z8546 Personal history of malignant neoplasm of prostate: Secondary | ICD-10-CM | POA: Diagnosis not present

## 2019-01-25 DIAGNOSIS — Z0001 Encounter for general adult medical examination with abnormal findings: Secondary | ICD-10-CM

## 2019-01-25 DIAGNOSIS — E1169 Type 2 diabetes mellitus with other specified complication: Secondary | ICD-10-CM

## 2019-01-25 DIAGNOSIS — F411 Generalized anxiety disorder: Secondary | ICD-10-CM | POA: Diagnosis not present

## 2019-01-25 DIAGNOSIS — I2510 Atherosclerotic heart disease of native coronary artery without angina pectoris: Secondary | ICD-10-CM

## 2019-01-25 DIAGNOSIS — M1A9XX0 Chronic gout, unspecified, without tophus (tophi): Secondary | ICD-10-CM

## 2019-01-25 DIAGNOSIS — F32A Depression, unspecified: Secondary | ICD-10-CM

## 2019-01-25 DIAGNOSIS — Z0000 Encounter for general adult medical examination without abnormal findings: Secondary | ICD-10-CM

## 2019-01-25 MED ORDER — EZETIMIBE 10 MG PO TABS: 10.0000 mg | ORAL_TABLET | Freq: Every day | ORAL | 3 refills | Status: DC

## 2019-01-25 MED ORDER — BISOPROLOL-HYDROCHLOROTHIAZIDE 5-6.25 MG PO TABS: 1.0000 | ORAL_TABLET | Freq: Every day | ORAL | 3 refills | Status: DC

## 2019-01-25 NOTE — Patient Instructions (Addendum)
Eye doctors that you can call, they are all very close to our office  Dr. Delman Cheadle [phone] Dr. Bing Plume [phone] Dr. Herbert Deaner [phone]   Diabetes is a very complicated disease...lets simplify it.  An easy way to look at it to understand the complications is if you think of the extra sugar floating in your blood stream as glass shards floating through your blood stream.    Diabetes affects your small vessels first: 1) The glass shards (sugar) scraps down the tiny blood vessels in your eyes and lead to diabetic retinopathy, the leading cause of blindness in the [place]. Diabetes is the leading cause of newly diagnosed adult (84 to 70 years of age) blindness in the [place].  2) The glass shards scratches down the tiny vessels of your legs leading to nerve damage called neuropathy and can lead to amputations of your feet. More than 60% of all non-traumatic amputations of lower limbs occur in people with diabetes.  3) Over time the small vessels in your brain are shredded and closed off, individually this does not cause any problems but over a long period of time many of the small vessels being blocked can lead to Vascular Dementia.   4) Your kidney's are a filter system and have a "net" that keeps certain things in the body and lets bad things out. Sugar shreds this net and leads to kidney damage and eventually failure. Decreasing the sugar that is destroying the net and certain blood pressure medications can help stop or decrease progression of kidney disease. Diabetes was the primary cause of kidney failure in 44 percent of all new cases in 2011.  5) Diabetes also destroys the small vessels in your penis that lead to erectile dysfunction. Eventually the vessels are so damaged that you may not be responsive to cialis or viagra.   Diabetes and your large vessels: Your larger vessels consist of your coronary arteries in your heart and the carotid vessels to your brain. Diabetes or even  increased sugars put you at 300% increased risk of heart attack and stroke and this is why.. The sugar scrapes down your large blood vessels and your body sees this as an internal injury and tries to repair itself. Just like you get a scab on your skin, your platelets will stick to the blood vessel wall trying to heal it. This is why we have diabetics on low dose aspirin daily, this prevents the platelets from sticking and can prevent plaque formation. In addition, your body takes cholesterol and tries to shove it into the open wound. This is why we want your LDL, or bad cholesterol, below 70.   The combination of platelets and cholesterol over 5-10 years forms plaque that can break off and cause a heart attack or stroke.   PLEASE REMEMBER:  Diabetes is preventable! Up to 53 percent of complications and morbidities among individuals with type 2 diabetes can be prevented, delayed, or effectively treated and minimized with regular visits to a health professional, appropriate monitoring and medication, and a healthy diet and lifestyle.  WATER IS IMPORTANT  Being dehydrated can hurt your kidneys, cause fatigue, headaches, muscle aches, joint pain, and dry skin/nails so please increase your fluids.   Drink 80-100 oz a day of water, measure it out! Eat 3 meals a day, have to do breakfast, eat protein- hard boiled eggs, protein bar like [place] protein bar, greek yogurt like oikos triple zero, chobani 100, or light  n fit greek  Can check out plantnanny app on your phone to help you keep track of your water   General eating tips  What to Avoid . Avoid added sugars o Often added sugar can be found in processed foods such as many condiments, dry cereals, cakes, cookies, chips, crisps, crackers, candies, sweetened drinks, etc.  o Read labels and AVOID/DECREASE use of foods with the following in their ingredient list: Sugar, fructose, high fructose corn syrup, sucrose, glucose, maltose, dextrose,  molasses, cane sugar, brown sugar, any type of syrup, agave nectar, etc.   . Avoid snacking in between meals- drink water or if you feel you need a snack, pick a high water content snack such as cucumbers, watermelon, or any veggie.  Marland Kitchen Avoid foods made with flour o If you are going to eat food made with flour, choose those made with whole-grains; and, minimize your consumption as much as is tolerable . Avoid processed foods o These foods are generally stocked in the middle of the grocery store.  o Focus on shopping on the perimeter of the grocery.  What to Include . Vegetables o GREEN LEAFY VEGETABLES: Kale, spinach, mustard greens, collard greens, cabbage, broccoli, etc. o OTHER: Asparagus, cauliflower, eggplant, carrots, peas, Brussel sprouts, tomatoes, bell peppers, zucchini, beets, cucumbers, etc. . Grains, seeds, and legumes o Beans: kidney beans, black eyed peas, garbanzo beans, black beans, pinto beans, etc. o Whole, unrefined grains: brown rice, barley, bulgur, oatmeal, etc. . Healthy fats  o Avoid highly processed fats such as vegetable oil o Examples of healthy fats: avocado, olives, virgin olive oil, dark chocolate (?72% Cocoa), nuts (peanuts, almonds, walnuts, cashews, pecans, etc.) o Please still do small amount of these healthy fats, they are dense in calories.  . Low - Moderate Intake of Animal Sources of Protein o Meat sources: chicken, [place], salmon, tuna. Limit to 4 ounces of meat at one time or the size of your palm. o Consider limiting dairy sources, but when choosing dairy focus on: PLAIN [place] yogurt, cottage cheese, high-protein milk . Fruit o Choose berries

## 2019-01-26 LAB — CBC WITH DIFFERENTIAL/PLATELET
Absolute Monocytes: 470 cells/uL (ref 200–950)
Basophils Absolute: 39 cells/uL (ref 0–200)
Basophils Relative: 0.5 %
Eosinophils Absolute: 239 cells/uL (ref 15–500)
Eosinophils Relative: 3.1 %
HCT: 43.3 % (ref 38.5–50.0)
Hemoglobin: 14.7 g/dL (ref 13.2–17.1)
Lymphs Abs: 2980 cells/uL (ref 850–3900)
MCH: 30.4 pg (ref 27.0–33.0)
MCHC: 33.9 g/dL (ref 32.0–36.0)
MCV: 89.5 fL (ref 80.0–100.0)
MPV: 9.7 fL (ref 7.5–12.5)
Monocytes Relative: 6.1 %
Neutro Abs: 3973 cells/uL (ref 1500–7800)
Neutrophils Relative %: 51.6 %
Platelets: 282 10*3/uL (ref 140–400)
RBC: 4.84 10*6/uL (ref 4.20–5.80)
RDW: 13.8 % (ref 11.0–15.0)
Total Lymphocyte: 38.7 %
WBC: 7.7 10*3/uL (ref 3.8–10.8)

## 2019-01-26 LAB — VITAMIN D 25 HYDROXY (VIT D DEFICIENCY, FRACTURES): Vit D, 25-Hydroxy: 65 ng/mL (ref 30–100)

## 2019-01-26 LAB — COMPLETE METABOLIC PANEL WITH GFR
AG Ratio: 1.4 (calc) (ref 1.0–2.5)
ALT: 28 U/L (ref 9–46)
AST: 27 U/L (ref 10–35)
Albumin: 4.5 g/dL (ref 3.6–5.1)
Alkaline phosphatase (APISO): 79 U/L (ref 35–144)
BUN/Creatinine Ratio: 17 (calc) (ref 6–22)
BUN: 24 mg/dL (ref 7–25)
CO2: 27 mmol/L (ref 20–32)
Calcium: 10.4 mg/dL — ABNORMAL HIGH (ref 8.6–10.3)
Chloride: 105 mmol/L (ref 98–110)
Creat: 1.4 mg/dL — ABNORMAL HIGH (ref 0.70–1.18)
GFR, Est African American: 59 mL/min/{1.73_m2} — ABNORMAL LOW (ref >=60)
GFR, Est Non African American: 51 mL/min/{1.73_m2} — ABNORMAL LOW (ref >=60)
Globulin: 3.2 g/dL (calc) (ref 1.9–3.7)
Glucose, Bld: 111 mg/dL — ABNORMAL HIGH (ref 65–99)
Potassium: 4.5 mmol/L (ref 3.5–5.3)
Sodium: 138 mmol/L (ref 135–146)
Total Bilirubin: 0.3 mg/dL (ref 0.2–1.2)
Total Protein: 7.7 g/dL (ref 6.1–8.1)

## 2019-01-26 LAB — LIPID PANEL
Cholesterol: 135 mg/dL (ref <200)
HDL: 30 mg/dL — ABNORMAL LOW (ref >=40)
LDL Cholesterol (Calc): 82 mg/dL (calc)
Non-HDL Cholesterol (Calc): 105 mg/dL (calc) (ref <130)
Total CHOL/HDL Ratio: 4.5 (calc) (ref <5.0)
Triglycerides: 126 mg/dL (ref <150)

## 2019-01-26 LAB — HEMOGLOBIN A1C
Hgb A1c MFr Bld: 6.5 % of total Hgb — ABNORMAL HIGH (ref <5.7)
Mean Plasma Glucose: 140 (calc)
eAG (mmol/L): 7.7 (calc)

## 2019-01-26 LAB — TSH: TSH: 1.15 mIU/L (ref 0.40–4.50)

## 2019-01-26 LAB — MAGNESIUM: Magnesium: 1.7 mg/dL (ref 1.5–2.5)

## 2019-01-30 ENCOUNTER — Encounter: Payer: Self-pay | Admitting: Orthopaedic Surgery

## 2019-01-30 ENCOUNTER — Other Ambulatory Visit: Payer: Self-pay

## 2019-01-30 ENCOUNTER — Ambulatory Visit (INDEPENDENT_AMBULATORY_CARE_PROVIDER_SITE_OTHER): Payer: Medicare Other

## 2019-01-30 ENCOUNTER — Ambulatory Visit (INDEPENDENT_AMBULATORY_CARE_PROVIDER_SITE_OTHER): Payer: Medicare Other | Admitting: Orthopaedic Surgery

## 2019-01-30 VITALS — BP 144/85 | HR 54 | Ht 65.0 in | Wt 191.0 lb

## 2019-01-30 DIAGNOSIS — M25511 Pain in right shoulder: Secondary | ICD-10-CM | POA: Diagnosis not present

## 2019-01-30 DIAGNOSIS — G8929 Other chronic pain: Secondary | ICD-10-CM

## 2019-01-30 DIAGNOSIS — I2510 Atherosclerotic heart disease of native coronary artery without angina pectoris: Secondary | ICD-10-CM

## 2019-01-30 DIAGNOSIS — I2584 Coronary atherosclerosis due to calcified coronary lesion: Secondary | ICD-10-CM

## 2019-01-30 MED ORDER — LIDOCAINE HCL 2 % IJ SOLN
2.0000 mL | INTRAMUSCULAR | Status: AC | PRN
  Administered 2019-01-30: 09:00:00 2 mL

## 2019-01-30 MED ORDER — METHYLPREDNISOLONE ACETATE 40 MG/ML IJ SUSP
80.0000 mg | INTRAMUSCULAR | Status: AC | PRN
  Administered 2019-01-30: 80 mg via INTRA_ARTICULAR

## 2019-01-30 MED ORDER — BUPIVACAINE HCL 0.5 % IJ SOLN
2.0000 mL | INTRAMUSCULAR | Status: AC | PRN
  Administered 2019-01-30: 09:00:00 2 mL via INTRA_ARTICULAR

## 2019-01-30 NOTE — Progress Notes (Signed)
Office Visit Note   Patient: Cody Zamora           Date of Birth: [birth year]           MRN: [id number] Visit Date: 01/30/2019              Requested by: Vicie Mutters, PA-C [address] [place],  [place] [address] PCP: [name], MD   Assessment & Plan: Visit Diagnoses:  1. Chronic right shoulder pain   2. Acute pain of right shoulder     Plan: Possible recurrent rotator cuff tear right shoulder.  Will inject subacromial space with cortisone and monitor response.  If no improvement over the next 3 to 4 weeks I would consider an MRI scan with the Morris technique  Follow-Up Instructions: No follow-ups on file.   Orders:  Orders Placed This Encounter  Procedures  . XR Shoulder Right   No orders of the defined types were placed in this encounter.     Procedures: Large Joint Inj: R subacromial bursa on 01/30/2019 8:38 AM Indications: pain and diagnostic evaluation Details: 25 G 1.5 in needle, anterolateral approach  Arthrogram: No  Medications: 2 mL lidocaine 2 %; 2 mL bupivacaine 0.5 %; 80 mg methylPREDNISolone acetate 40 MG/ML Consent was given by the patient. Immediately prior to procedure a time out was called to verify the correct patient, procedure, equipment, support staff and site/side marked as required. Patient was prepped and draped in the usual sterile fashion.       Clinical Data: No additional findings.   Subjective: Chief Complaint  Patient presents with  . Right Shoulder - Pain  Patient presents today for right shoulder pain. His shoulder has been hurting for 2-3 months. His pain is located anteriorly and sometimes posteriorly. The pain is worse with lying down, and has noticed that he has to sleep sitting up. No numbness of tingling. He has noticed some weakness in his arm. He is right hand dominant. He has a history of right shoulder rotator cuff tear repair around 2008. No related history of injury or trauma.  No  numbness or tingling.  No neck pain.  Having difficulty sleeping on his right dominant side associated with discomfort with overhead motion.  HPI  Review of Systems  Constitutional: Negative for fatigue.  HENT: Negative for ear pain.   Eyes: Negative for discharge.  Respiratory: Negative for shortness of breath.   Cardiovascular: Negative for leg swelling.  Gastrointestinal: Negative for constipation and diarrhea.  Endocrine: Negative for cold intolerance and heat intolerance.  Genitourinary: Negative for difficulty urinating.  Musculoskeletal: Negative for joint swelling.  Skin: Negative for rash.  Allergic/Immunologic: Positive for food allergies.  Neurological: Positive for weakness.  Hematological: Does not bruise/bleed easily.  Psychiatric/Behavioral: Positive for sleep disturbance.     Objective: Vital Signs: BP (!) 144/85   Pulse (!) 54   Ht 5\' 5"  (1.651 m)   Wt 191 lb (86.6 kg)   BMI 31.78 kg/m   Physical Exam Constitutional:      Appearance: He is well-developed.  Eyes:     Pupils: Pupils are equal, round, and reactive to light.  Pulmonary:     Effort: Pulmonary effort is normal.  Skin:    General: Skin is warm and dry.  Neurological:     Mental Status: He is alert and oriented to person, place, and time.  Psychiatric:        Behavior: Behavior normal.     Ortho Exam  awake alert and oriented x3.  Comfortable sitting.  Positive impingement on extremes of internal and external rotation.  Good strength.  Minimally positive empty can test.  Biceps intact.  Mild pain along the anterior and lateral subacromial space.  Skin intact.  Neurologically intact.  No pain range of motion of cervical spine  Specialty Comments:  No specialty comments available.  Imaging: No results found.   PMFS History: Patient Active Problem List   Diagnosis Date Noted  . Pain in right shoulder 01/30/2019  . Gout 07/20/2018  . Depression 07/01/2017  . CKD stage 2 due to type 2  diabetes mellitus ([facility]) 06/30/2017  . Obesity (BMI 30.0-34.9) 06/30/2017  . Atherosclerosis of coronary artery 03/21/2017  . Encounter for Medicare annual wellness exam 07/23/2015  . Generalized anxiety disorder 07/23/2015  . 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 Erectile dysfunction following radical prostatectomy 10/28/2014  . Medication management 10/06/2013  . 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 Essential hypertension 04/06/2013  . Hyperlipidemia associated with type 2 diabetes mellitus ([facility]) 04/06/2013  . Vitamin D deficiency 04/06/2013  . Nephrolithiasis 04/06/2013   Past Medical History:  Diagnosis Date  . ED (erectile dysfunction) of organic origin   . History of colon polyps   . History of kidney stones   . History of positive PPD    06/ 2006--  CXR NORMAL  . History of prostate cancer    DEC 2008--  S/P  RADICAL PROSTATECTOMY  . Hyperlipidemia   . Hypertension   . Hypogonadism male   . Mixed hyperlipidemia   . Nocturia   . Sigmoid diverticulosis    MILD  . Type 2 diabetes mellitus ([name])     Family History  Problem Relation Age of Onset  . Heart disease Mother   . Diabetes Mother   . Arthritis Mother   . Alcohol abuse Father   . Hypertension Father     Past Surgical History:  Procedure Laterality Date  . ACHILLES TENDON REPAIR Right 1989  . COLONOSCOPY N/A last one 10-28-2010    due 10 yr f/u in 2022 - [facility]  . FINGER AMPUTATION  age 82   left Middle  . NEGATIVE SLEEP STUDY  2010  per pt  . PENILE PROSTHESIS IMPLANT N/A 10/28/2014   Procedure: IMPLANTATION 3 PIECE PENILE INFLATABLE PROTHESIS/COLOPLAST SCROTAL APPROACH;  Surgeon: Kathie Rhodes, MD;  Location: [facility];  Service: Urology;  Laterality: N/A;  . PERCUTANEOUS NEPHROLITHOTRIPSY Left 04-04-2009// second look 04-15-2009  . ROBOT ASSISTED LAPAROSCOPIC RADICAL PROSTATECTOMY  Dec 2008  . SHOULDER OPEN ROTATOR CUFF REPAIR Right 05-11-2006  . URETEROLITHOTOMY   2002   Social History   Occupational History  . Not on file  Tobacco Use  . Smoking status: Never Smoker  . Smokeless tobacco: Never Used  Substance and Sexual Activity  . Alcohol use: No  . Drug use: No  . Sexual activity: Not on file

## 2019-01-31 NOTE — Patient Instructions (Signed)
DUE TO COVID-19 ONLY ONE VISITOR IS ALLOWED TO COME WITH YOU AND STAY IN THE WAITING ROOM ONLY DURING PRE OP AND PROCEDURE DAY OF SURGERY. THE 1 VISITOR MAY VISIT WITH YOU AFTER SURGERY IN YOUR PRIVATE ROOM DURING VISITING HOURS ONLY!  YOU NEED TO HAVE A COVID 19 TEST ON_______ @_______ , THIS TEST MUST BE DONE BEFORE SURGERY, COME  [place], [place] , [address].  ([place]) ONCE YOUR COVID TEST IS COMPLETED, PLEASE BEGIN THE QUARANTINE INSTRUCTIONS AS OUTLINED IN YOUR HANDOUT.                KAILI ZINDEL  01/31/2019   Your procedure is scheduled on: 02-05-19   Report to [facility] Main  Entrance   Report to Radiology  at      0800 AM     Call this number if you have problems the morning of surgery [phone]    Remember: Do not eat food or drink liquids :After Midnight.   BRUSH YOUR TEETH MORNING OF SURGERY AND RINSE YOUR MOUTH OUT, NO CHEWING GUM CANDY OR MINTS.     Take these medicines the morning of surgery with A SIP OF WATER: zetia , allopurinol  DO NOT TAKE ANY DIABETIC MEDICATIONS DAY OF YOUR SURGERY                               You may not have any metal on your body including hair pins and              piercings  Do not wear jewelry,  lotions, powders or perfumes, deodorant              Men may shave face and neck.   Do not bring valuables to the hospital. [place].  Contacts, dentures or bridgework may not be worn into surgery.               Please read over the following fact sheets you were given: _____________________________________________________________________             [facility] - Preparing for Surgery Before surgery, you can play an important role.  Because skin is not sterile, your skin needs to be as free of germs as possible.  You can reduce the number of germs on your skin by washing with CHG (chlorahexidine gluconate) soap before surgery.  CHG is an  antiseptic cleaner which kills germs and bonds with the skin to continue killing germs even after washing. Please DO NOT use if you have an allergy to CHG or antibacterial soaps.  If your skin becomes reddened/irritated stop using the CHG and inform your nurse when you arrive at Short Stay. Do not shave (including legs and underarms) for at least 48 hours prior to the first CHG shower.  You may shave your face/neck. Please follow these instructions carefully:  1.  Shower with CHG Soap the night before surgery and the  morning of Surgery.  2.  If you choose to wash your hair, wash your hair first as usual with your  normal  shampoo.  3.  After you shampoo, rinse your hair and body thoroughly to remove the  shampoo.  4.  Use CHG as you would any other liquid soap.  You can apply chg directly  to the skin and wash                       Gently with a scrungie or clean washcloth.  5.  Apply the CHG Soap to your body ONLY FROM THE NECK DOWN.   Do not use on face/ open                           Wound or open sores. Avoid contact with eyes, ears mouth and genitals (private parts).                       Wash face,  Genitals (private parts) with your normal soap.             6.  Wash thoroughly, paying special attention to the area where your surgery  will be performed.  7.  Thoroughly rinse your body with warm water from the neck down.  8.  DO NOT shower/wash with your normal soap after using and rinsing off  the CHG Soap.                9.  Pat yourself dry with a clean towel.            10.  Wear clean pajamas.            11.  Place clean sheets on your bed the night of your first shower and do not  sleep with pets. Day of Surgery : Do not apply any lotions/deodorants the morning of surgery.  Please wear clean clothes to the hospital/surgery center.  FAILURE TO FOLLOW THESE INSTRUCTIONS MAY RESULT IN THE CANCELLATION OF YOUR SURGERY PATIENT  SIGNATURE_________________________________  NURSE SIGNATURE__________________________________  ________________________________________________________________________

## 2019-01-31 NOTE — Progress Notes (Signed)
PCP - [name] Cardiologist -   Chest x-ray -  EKG - 04-13-18 epic Stress Test -  ECHO -  Cardiac Cath -  CBC/ DIFF, BMP,HGBa1C   01-25-19 in epic  Sleep Study -  CPAP - n/a  Fasting Blood Sugar - 100 or less Checks Blood Sugar ____ times a day  Blood Thinner Instructions: Aspirin Instructions: Last Dose:  Anesthesia review:   Patient denies shortness of breath, fever, cough and chest pain at PAT appointment  NONE   Patient verbalized understanding of instructions that were given to them at the PAT appointment. Patient was also instructed that they will need to review over the PAT instructions again at home before surgery.

## 2019-02-01 ENCOUNTER — Other Ambulatory Visit (HOSPITAL_COMMUNITY)
Admission: RE | Admit: 2019-02-01 | Discharge: 2019-02-01 | Disposition: A | Discharge status: Home / Self Care | Payer: Medicare Other | Source: Ambulatory Visit | Attending: Urology | Admitting: Urology

## 2019-02-01 ENCOUNTER — Other Ambulatory Visit: Payer: Self-pay

## 2019-02-01 ENCOUNTER — Encounter (HOSPITAL_COMMUNITY): Payer: Self-pay

## 2019-02-01 ENCOUNTER — Encounter (HOSPITAL_COMMUNITY)
Admission: RE | Admit: 2019-02-01 | Discharge: 2019-02-01 | Disposition: A | Discharge status: Home / Self Care | Payer: Medicare Other | Source: Ambulatory Visit | Attending: Urology | Admitting: Urology

## 2019-02-01 DIAGNOSIS — Z20828 Contact with and (suspected) exposure to other viral communicable diseases: Secondary | ICD-10-CM | POA: Diagnosis not present

## 2019-02-01 DIAGNOSIS — Z01812 Encounter for preprocedural laboratory examination: Secondary | ICD-10-CM | POA: Diagnosis not present

## 2019-02-01 DIAGNOSIS — N200 Calculus of kidney: Secondary | ICD-10-CM | POA: Diagnosis not present

## 2019-02-01 HISTORY — DX: Unspecified asthma, uncomplicated: J45.909

## 2019-02-01 HISTORY — DX: Gout, unspecified: M10.9

## 2019-02-01 LAB — GLUCOSE, CAPILLARY: Glucose-Capillary: 122 mg/dL — ABNORMAL HIGH (ref 70–99)

## 2019-02-02 ENCOUNTER — Other Ambulatory Visit: Payer: Self-pay | Admitting: Radiology

## 2019-02-03 LAB — NOVEL CORONAVIRUS, NAA (HOSP ORDER, SEND-OUT TO REF LAB; TAT 18-24 HRS): SARS-CoV-2, NAA: NOT DETECTED

## 2019-02-04 NOTE — H&P (Signed)
HPI: Cody Zamora is a 68 year-old male with a large right renal calculus.  The patient was last seen 06/06/2018. The patient's stone was on his right side. He did not pass a stone since the last office visit.   The patient has had flank pain since they were last seen. The patient denies any progressive voiding symptoms. He has no seen blood in his urine since the last visit. He is currently having flank pain. He denies having back pain, groin pain, nausea, vomiting, fever, and chills. He has had Percutaneous Nephrolithotomy for treatment of his stones in the past. This condition would be considered of mild to moderate severity with no modifying factors or associated signs or symptoms other than as noted above.   06/06/18: He recently reported experiencing right-sided back pain. His urinalysis was noted be free of any red cells. He has a history of calculus disease.  He reports that he has been experiencing low back pain and some pain in his flank. He said this is similar to pain he has had with his stones in the past. He was concerned that he might have a stone again.   11/24/18: He returns today having postpone his surgery previously but said that the pain in his right flank has worsened. He has not passed any stone fragments. No nausea, vomiting or fever. The pain is primarily located in the right flank and not relieved by positional change.     ALLERGIES: Fructose SOLN Morphine Derivatives    MEDICATIONS: Allopurinol 300 mg tablet  Allegra 60 mg tablet Oral  Aspirin 81 MG TABS Oral  Benefiber 1 gram tablet Oral  Diovan 160 mg capsule Oral  Fish Oil CAPS Oral  Flonase 50 mcg/actuation spray, suspension Nasal  Metformin Hcl 500 mg tablet Oral  Oxycodone Hcl 10 mg tablet Oral  Vitamin D3 50 mcg (2,000 unit) tablet Oral  Zetia 10 mg tablet Oral  Ziac     GU PSH: Cystoscopy And Treatment - 2008 Insertion IPP - 2016 Percut Stone Removal >2cm - 2010 Stone Removal Nephrost Tube -  2011       PSH Notes: Surg Penis Insertion Of Penile Prosthesis, Prostatect Perineal Radical W/ Bilat Pelvic Lymphadenectomy, Renal Endoscopy Through Nephrostomy With Calculus Removal, Percutaneous Lithotomy For Stone Over 2cm., Cystoscopy With Manipulation Of Ureteral Calculus, Shoulder Surgery, Primary Repair Of Ruptured Achilles Tendon   NON-GU PSH: Extensive Prostate Surgery - 2016 Repair Achilles Tendon - 2008     GU PMH: Renal calculus, Right, He has right renal calculi that are of an aggregate size that PCNL is indicated. He has undergone this procedure in the past. Therefore we discussed proceeding with surgical management which is his desire. - 06/06/2018, Bilateral kidney stones, - 2016 ED following radical prostatectomy, Erectile dysfunction following radical prostatectomy - 2016 History of prostate cancer, History of malignant neoplasm of prostate - 2016 Primary hypogonadism, Hypogonadism, testicular - 2016 Stress Incontinence, Male stress incontinence - 2016      PMH Notes: Adenocarcinoma of the prostate: He had a PSA elevation from his baseline of 2.8 and underwent TRUS/BX in 9/08 which revealed Gleason 3+4 = 7 adenocarcinoma.  Treatment: Radical prostatectomy by Dr. Nevada Crane in 12/08. His PSA fell undetectable and is remained there.   History of calculus disease: He underwent a left percutaneous nephrolithotomy in 12/10. Second look was undertaken to clear him of all stone fragments. He has also undergone a previous nephrolithotomy.  KUB in 4/16 revealed no obvious radio opaque calculi.  Stone  analysis calcium phosphate 80% and calcium oxalate 20%. He is noted to have a normal serum calcium.   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   Hypogonadism: He was found to have a low serum testosterone and placed on testosterone replacement therapy by his primary care physician using Testim.   Erectile dysfunction: This developed  after his radical prostatectomy. He has tried oral agents without improvement and did develop side effects. He has also tried a vacuum erection device as well as intracavernosal injections without adequate response.  Treatment: Three-piece inflatable penile prosthesis implantation 10/28/14.   NON-GU PMH: Pyuria/other UA findings, He did have pyuria and some bacteriuria today. I will culture his urine in preparation for surgery. - 06/06/2018 Encounter for general adult medical examination without abnormal findings, Encounter for preventive health examination - 2016 Personal history of other endocrine, nutritional and metabolic disease, History of hypercholesterolemia - 2014 Personal history of other mental and behavioral disorders, History of depression - 2014 Anxiety Asthma Depression Gout Hypercholesterolemia Hypertension    FAMILY HISTORY: Arthritis - Mother Death In The Family Father - Runs In Family Death In The Family Mother - Runs In Family Family Health Status Number - Runs In Family nephrolithiasis - Mother   SOCIAL HISTORY: Marital Status: Married Preferred Language: English; Ethnicity: Not Hispanic Or Latino; Race: Black or African American Current Smoking Status: Patient has never smoked.   Tobacco Use Assessment Completed: Used Tobacco in last 30 days? Does not use smokeless tobacco. Light Drinker.  Drinks 2 caffeinated drinks per day.     Notes: Never smoker, Caffeine Use, Tobacco Use, Alcohol Use, Marital History - Currently Married, Occupation:   REVIEW OF SYSTEMS:    GU Review Male:   Patient denies frequent urination, hard to postpone urination, burning/ pain with urination, get up at night to urinate, leakage of urine, stream starts and stops, trouble starting your stream, have to strain to urinate , erection problems, and penile pain.  Gastrointestinal (Upper):   Patient denies nausea, vomiting, and indigestion/ heartburn.  Gastrointestinal (Lower):   Patient  denies diarrhea and constipation.  Constitutional:   Patient denies fatigue, weight loss, fever, and night sweats.  Skin:   Patient denies skin rash/ lesion and itching.  Eyes:   Patient denies blurred vision and double vision.  Ears/ Nose/ Throat:   Patient denies sore throat and sinus problems.  Hematologic/Lymphatic:   Patient denies swollen glands and easy bruising.  Cardiovascular:   Patient denies leg swelling and chest pains.  Respiratory:   Patient denies cough and shortness of breath.  Endocrine:   Patient denies excessive thirst.  Musculoskeletal:   Patient denies back pain and joint pain.  Neurological:   Patient denies headaches and dizziness.  Psychologic:   Patient denies depression and anxiety.     VITAL SIGNS:    Weight 195 lb / 88.45 kg  Height 65 in / 165.1 cm  BP 128/74 mmHg  Pulse 49 /min  BMI 32.4 kg/m   MULTI-SYSTEM PHYSICAL EXAMINATION:    Constitutional: Well-nourished. No physical deformities. Normally developed. Good grooming.  Neck: Neck symmetrical, not swollen. Normal tracheal position.  Respiratory: No labored breathing, no use of accessory muscles.   Cardiovascular: Normal temperature, normal extremity pulses, no swelling, no varicosities.  Lymphatic: No enlargement of neck, axillae, groin.  Skin: No paleness, no jaundice, no cyanosis. No lesion, no ulcer, no rash.  Neurologic / Psychiatric: Oriented to time, oriented to place, oriented to person. No  depression, no anxiety, no agitation.  Gastrointestinal: No mass, no tenderness, no rigidity, non obese abdomen.  Eyes: Normal conjunctivae. Normal eyelids.  Ears, Nose, Mouth, and Throat: Left ear no scars, no lesions, no masses. Right ear no scars, no lesions, no masses. Nose no scars, no lesions, no masses. Normal hearing. Normal lips.  Musculoskeletal: Normal gait and station of head and neck.     PAST DATA REVIEWED:  Source Of History:  Patient  Lab Test Review:   BUN/Creatinine, Calcium  Records  Review:   Previous Patient Records, POC Tool   04/14/18 12/03/09 03/24/09 05/22/08  PSA  Total PSA < 0.1 ng/dl <0.04  <0.04  <0.04     12/03/09  Hormones  Testosterone, Total 274.0    Notes:                     In 7/20 his creatinine was 1.1 with a normal serum calcium of 9.6.   PROCEDURES:          Urinalysis w/Scope Dipstick Dipstick Cont'd Micro  Color: Yellow Bilirubin: Neg mg/dL WBC/hpf: 10 - 20/hpf  Appearance: Clear Ketones: Neg mg/dL RBC/hpf: 10 - 20/hpf  Specific Gravity: 1.025 Blood: 2+ ery/uL Bacteria: NS (Not Seen)  pH: 5.5 Protein: Trace mg/dL Cystals: NS (Not Seen)  Glucose: Neg mg/dL Urobilinogen: 0.2 mg/dL Casts: NS (Not Seen)    Nitrites: Neg Trichomonas: Not Present    Leukocyte Esterase: 1+ leu/uL Mucous: Not Present      Epithelial Cells: 0 - 5/hpf      Yeast: NS (Not Seen)      Sperm: Not Present    ASSESSMENT/PLAN:      ICD-10 Details  1 GU:   Renal calculus - N20.0 Right, He has had worsening right flank pain. 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  2 NON-GU:   Pyuria/other UA findings - R82.79 I noted red and white cells in his urine. His urine will be cultured in preparation for surgery.

## 2019-02-04 NOTE — Discharge Instructions (Signed)

## 2019-02-04 NOTE — Anesthesia Preprocedure Evaluation (Addendum)
Anesthesia Evaluation  Patient identified by MRN, date of birth, ID band Patient awake    Reviewed: Allergy & Precautions, NPO status , Patient's Chart, lab work & pertinent test results  Airway Mallampati: II  TM Distance: >3 FB Neck ROM: Full    Dental no notable dental hx. (+) Teeth Intact, Dental Advisory Given   Pulmonary    Pulmonary exam normal breath sounds clear to auscultation       Cardiovascular Exercise Tolerance: Good hypertension, Pt. on medications and Pt. on home beta blockers + CAD  negative cardio ROS Normal cardiovascular exam Rhythm:Regular Rate:Normal     Neuro/Psych Depression negative neurological ROS     GI/Hepatic negative GI ROS, Neg liver ROS,   Endo/Other  diabetes, Type 2  Renal/GU Reanal calculi K+ 4.5 Cr 1.40     Musculoskeletal   Abdominal   Peds  Hematology Hgb 14.7 plt 282   Anesthesia Other Findings   Reproductive/Obstetrics negative OB ROS                            Anesthesia Physical Anesthesia Plan  ASA: III  Anesthesia Plan: General   Post-op Pain Management:    Induction: Intravenous  PONV Risk Score and Plan: Treatment may vary due to age or medical condition, Ondansetron and Dexamethasone  Airway Management Planned: Oral ETT  Additional Equipment: None  Intra-op Plan:   Post-operative Plan: Extubation in OR  Informed Consent: I have reviewed the patients History and Physical, chart, labs and discussed the procedure including the risks, benefits and alternatives for the proposed anesthesia with the patient or authorized representative who has indicated his/her understanding and acceptance.     Dental advisory given  Plan Discussed with: CRNA  Anesthesia Plan Comments:         Anesthesia Quick Evaluation

## 2019-02-05 ENCOUNTER — Ambulatory Visit (HOSPITAL_COMMUNITY): Payer: Medicare Other

## 2019-02-05 ENCOUNTER — Encounter (HOSPITAL_COMMUNITY): Payer: Self-pay

## 2019-02-05 ENCOUNTER — Ambulatory Visit (HOSPITAL_COMMUNITY): Payer: Medicare Other | Admitting: Physician Assistant

## 2019-02-05 ENCOUNTER — Other Ambulatory Visit: Payer: Self-pay

## 2019-02-05 ENCOUNTER — Ambulatory Visit (HOSPITAL_COMMUNITY)
Admission: RE | Admit: 2019-02-05 | Discharge: 2019-02-05 | Disposition: A | Discharge status: Home / Self Care | Payer: Medicare Other | Source: Ambulatory Visit | Attending: Urology | Admitting: Urology

## 2019-02-05 ENCOUNTER — Ambulatory Visit (HOSPITAL_COMMUNITY)
Admission: RE | Admit: 2019-02-05 | Discharge: 2019-02-06 | Disposition: A | Discharge status: Home / Self Care | Payer: Medicare Other | Source: Ambulatory Visit | Attending: Urology | Admitting: Urology

## 2019-02-05 ENCOUNTER — Ambulatory Visit (HOSPITAL_COMMUNITY): Payer: Medicare Other | Admitting: Certified Registered Nurse Anesthetist

## 2019-02-05 ENCOUNTER — Ambulatory Visit (HOSPITAL_COMMUNITY)
Admission: RE | Admit: 2019-02-05 | Discharge: 2019-02-05 | Disposition: A | Discharge status: Home / Self Care | Payer: Medicare Other | Source: Ambulatory Visit | Attending: Internal Medicine | Admitting: Internal Medicine

## 2019-02-05 ENCOUNTER — Encounter (HOSPITAL_COMMUNITY)
Admission: RE | Disposition: A | Discharge status: Home / Self Care | Payer: Self-pay | Source: Ambulatory Visit | Attending: Urology

## 2019-02-05 DIAGNOSIS — Z841 Family history of disorders of kidney and ureter: Secondary | ICD-10-CM | POA: Insufficient documentation

## 2019-02-05 DIAGNOSIS — K760 Fatty (change of) liver, not elsewhere classified: Secondary | ICD-10-CM | POA: Insufficient documentation

## 2019-02-05 DIAGNOSIS — Z79899 Other long term (current) drug therapy: Secondary | ICD-10-CM | POA: Insufficient documentation

## 2019-02-05 DIAGNOSIS — Z7984 Long term (current) use of oral hypoglycemic drugs: Secondary | ICD-10-CM | POA: Insufficient documentation

## 2019-02-05 DIAGNOSIS — N182 Chronic kidney disease, stage 2 (mild): Secondary | ICD-10-CM | POA: Diagnosis not present

## 2019-02-05 DIAGNOSIS — Z7982 Long term (current) use of aspirin: Secondary | ICD-10-CM | POA: Insufficient documentation

## 2019-02-05 DIAGNOSIS — K5730 Diverticulosis of large intestine without perforation or abscess without bleeding: Secondary | ICD-10-CM | POA: Insufficient documentation

## 2019-02-05 DIAGNOSIS — N393 Stress incontinence (female) (male): Secondary | ICD-10-CM | POA: Diagnosis not present

## 2019-02-05 DIAGNOSIS — I10 Essential (primary) hypertension: Secondary | ICD-10-CM | POA: Insufficient documentation

## 2019-02-05 DIAGNOSIS — J45909 Unspecified asthma, uncomplicated: Secondary | ICD-10-CM | POA: Insufficient documentation

## 2019-02-05 DIAGNOSIS — N202 Calculus of kidney with calculus of ureter: Secondary | ICD-10-CM | POA: Insufficient documentation

## 2019-02-05 DIAGNOSIS — E785 Hyperlipidemia, unspecified: Secondary | ICD-10-CM | POA: Insufficient documentation

## 2019-02-05 DIAGNOSIS — Z885 Allergy status to narcotic agent status: Secondary | ICD-10-CM | POA: Diagnosis not present

## 2019-02-05 DIAGNOSIS — I2510 Atherosclerotic heart disease of native coronary artery without angina pectoris: Secondary | ICD-10-CM | POA: Diagnosis not present

## 2019-02-05 DIAGNOSIS — F419 Anxiety disorder, unspecified: Secondary | ICD-10-CM | POA: Insufficient documentation

## 2019-02-05 DIAGNOSIS — Z8546 Personal history of malignant neoplasm of prostate: Secondary | ICD-10-CM | POA: Insufficient documentation

## 2019-02-05 DIAGNOSIS — E119 Type 2 diabetes mellitus without complications: Secondary | ICD-10-CM | POA: Insufficient documentation

## 2019-02-05 DIAGNOSIS — Z888 Allergy status to other drugs, medicaments and biological substances status: Secondary | ICD-10-CM | POA: Diagnosis not present

## 2019-02-05 DIAGNOSIS — I129 Hypertensive chronic kidney disease with stage 1 through stage 4 chronic kidney disease, or unspecified chronic kidney disease: Secondary | ICD-10-CM | POA: Diagnosis not present

## 2019-02-05 DIAGNOSIS — N200 Calculus of kidney: Secondary | ICD-10-CM | POA: Insufficient documentation

## 2019-02-05 DIAGNOSIS — Z91018 Allergy to other foods: Secondary | ICD-10-CM | POA: Insufficient documentation

## 2019-02-05 DIAGNOSIS — E1122 Type 2 diabetes mellitus with diabetic chronic kidney disease: Secondary | ICD-10-CM | POA: Diagnosis not present

## 2019-02-05 DIAGNOSIS — E7800 Pure hypercholesterolemia, unspecified: Secondary | ICD-10-CM | POA: Insufficient documentation

## 2019-02-05 DIAGNOSIS — F329 Major depressive disorder, single episode, unspecified: Secondary | ICD-10-CM | POA: Insufficient documentation

## 2019-02-05 DIAGNOSIS — M109 Gout, unspecified: Secondary | ICD-10-CM | POA: Insufficient documentation

## 2019-02-05 HISTORY — PX: HOLMIUM LASER APPLICATION: SHX5852

## 2019-02-05 HISTORY — PX: IR URETERAL STENT RIGHT NEW ACCESS W/O SEP NEPHROSTOMY CATH: IMG6076

## 2019-02-05 HISTORY — PX: NEPHROLITHOTOMY: SHX5134

## 2019-02-05 LAB — CBC WITH DIFFERENTIAL/PLATELET
Abs Immature Granulocytes: 0.02 10*3/uL (ref 0.00–0.07)
Basophils Absolute: 0 10*3/uL (ref 0.0–0.1)
Basophils Relative: 0 %
Eosinophils Absolute: 0.2 10*3/uL (ref 0.0–0.5)
Eosinophils Relative: 2 %
HCT: 44 % (ref 39.0–52.0)
Hemoglobin: 14.5 g/dL (ref 13.0–17.0)
Immature Granulocytes: 0 %
Lymphocytes Relative: 36 %
Lymphs Abs: 3.7 10*3/uL (ref 0.7–4.0)
MCH: 30.5 pg (ref 26.0–34.0)
MCHC: 33 g/dL (ref 30.0–36.0)
MCV: 92.6 fL (ref 80.0–100.0)
Monocytes Absolute: 0.5 10*3/uL (ref 0.1–1.0)
Monocytes Relative: 5 %
Neutro Abs: 5.7 10*3/uL (ref 1.7–7.7)
Neutrophils Relative %: 57 %
Platelets: 273 10*3/uL (ref 150–400)
RBC: 4.75 MIL/uL (ref 4.22–5.81)
RDW: 13.9 % (ref 11.5–15.5)
WBC: 10.1 10*3/uL (ref 4.0–10.5)
nRBC: 0 % (ref 0.0–0.2)

## 2019-02-05 LAB — BASIC METABOLIC PANEL
Anion gap: 9 (ref 5–15)
BUN: 23 mg/dL (ref 8–23)
CO2: 24 mmol/L (ref 22–32)
Calcium: 9.5 mg/dL (ref 8.9–10.3)
Chloride: 103 mmol/L (ref 98–111)
Creatinine, Ser: 1.28 mg/dL — ABNORMAL HIGH (ref 0.61–1.24)
GFR calc Af Amer: >60 mL/min (ref >60)
GFR calc non Af Amer: 56 mL/min — ABNORMAL LOW (ref >60)
Glucose, Bld: 117 mg/dL — ABNORMAL HIGH (ref 70–99)
Potassium: 4.1 mmol/L (ref 3.5–5.1)
Sodium: 136 mmol/L (ref 135–145)

## 2019-02-05 LAB — PROTIME-INR
INR: 1 (ref 0.8–1.2)
Prothrombin Time: 12.6 seconds (ref 11.4–15.2)

## 2019-02-05 LAB — GLUCOSE, CAPILLARY
Glucose-Capillary: 113 mg/dL — ABNORMAL HIGH (ref 70–99)
Glucose-Capillary: 124 mg/dL — ABNORMAL HIGH (ref 70–99)
Glucose-Capillary: 148 mg/dL — ABNORMAL HIGH (ref 70–99)
Glucose-Capillary: 259 mg/dL — ABNORMAL HIGH (ref 70–99)

## 2019-02-05 SURGERY — NEPHROLITHOTOMY PERCUTANEOUS
Anesthesia: General | Laterality: Right

## 2019-02-05 MED ORDER — CEFAZOLIN SODIUM-DEXTROSE 2-4 GM/100ML-% IV SOLN: 2.0000 g | INTRAVENOUS | Status: DC

## 2019-02-05 MED ORDER — LIDOCAINE 2% (20 MG/ML) 5 ML SYRINGE
INTRAMUSCULAR | Status: DC | PRN
  Administered 2019-02-05: 100 mg via INTRAVENOUS

## 2019-02-05 MED ORDER — ROCURONIUM BROMIDE 50 MG/5ML IV SOSY
PREFILLED_SYRINGE | INTRAVENOUS | Status: DC | PRN
  Administered 2019-02-05: 50 mg via INTRAVENOUS

## 2019-02-05 MED ORDER — SUCCINYLCHOLINE CHLORIDE 200 MG/10ML IV SOSY
PREFILLED_SYRINGE | INTRAVENOUS | Status: DC | PRN
  Administered 2019-02-05: 100 mg via INTRAVENOUS

## 2019-02-05 MED ORDER — LACTATED RINGERS IV SOLN
INTRAVENOUS | Status: DC
  Administered 2019-02-05 (×2): via INTRAVENOUS

## 2019-02-05 MED ORDER — DEXAMETHASONE SODIUM PHOSPHATE 10 MG/ML IJ SOLN
INTRAMUSCULAR | Status: AC
  Filled 2019-02-05: qty 1

## 2019-02-05 MED ORDER — DIPHENHYDRAMINE HCL 12.5 MG/5ML PO ELIX: 12.5000 mg | ORAL_SOLUTION | Freq: Four times a day (QID) | ORAL | Status: DC | PRN

## 2019-02-05 MED ORDER — MIDAZOLAM HCL 2 MG/2ML IJ SOLN
INTRAMUSCULAR | Status: AC | PRN
  Administered 2019-02-05 (×3): 1 mg via INTRAVENOUS

## 2019-02-05 MED ORDER — PROPOFOL 10 MG/ML IV BOLUS
INTRAVENOUS | Status: DC | PRN
  Administered 2019-02-05: 130 mg via INTRAVENOUS

## 2019-02-05 MED ORDER — KETOROLAC TROMETHAMINE 30 MG/ML IJ SOLN: 15.0000 mg | Freq: Once | INTRAMUSCULAR | Status: DC | PRN

## 2019-02-05 MED ORDER — LIDOCAINE-EPINEPHRINE 1 %-1:100000 IJ SOLN
INTRAMUSCULAR | Status: AC | PRN
  Administered 2019-02-05: 10 mL

## 2019-02-05 MED ORDER — HYDROMORPHONE HCL 1 MG/ML IJ SOLN
INTRAMUSCULAR | Status: AC
  Filled 2019-02-05: qty 1

## 2019-02-05 MED ORDER — PHENYLEPHRINE HCL (PRESSORS) 10 MG/ML IV SOLN
INTRAVENOUS | Status: AC
  Filled 2019-02-05: qty 1

## 2019-02-05 MED ORDER — OXYCODONE HCL 5 MG/5ML PO SOLN: 5.0000 mg | Freq: Once | ORAL | Status: DC | PRN

## 2019-02-05 MED ORDER — MIDAZOLAM HCL 2 MG/2ML IJ SOLN
INTRAMUSCULAR | Status: AC
  Filled 2019-02-05: qty 4

## 2019-02-05 MED ORDER — OXYBUTYNIN CHLORIDE 5 MG PO TABS: 5.0000 mg | ORAL_TABLET | Freq: Three times a day (TID) | ORAL | Status: DC | PRN

## 2019-02-05 MED ORDER — PROPOFOL 10 MG/ML IV BOLUS
INTRAVENOUS | Status: AC
  Filled 2019-02-05: qty 20

## 2019-02-05 MED ORDER — CHLORHEXIDINE GLUCONATE CLOTH 2 % EX PADS: 6.0000 | MEDICATED_PAD | Freq: Every day | CUTANEOUS | Status: DC

## 2019-02-05 MED ORDER — OXYCODONE HCL 5 MG PO TABS: 5.0000 mg | ORAL_TABLET | Freq: Once | ORAL | Status: DC | PRN

## 2019-02-05 MED ORDER — 0.9 % SODIUM CHLORIDE (POUR BTL) OPTIME
TOPICAL | Status: DC | PRN
  Administered 2019-02-05: 12:00:00 1000 mL

## 2019-02-05 MED ORDER — DIPHENHYDRAMINE HCL 50 MG/ML IJ SOLN: 12.5000 mg | Freq: Four times a day (QID) | INTRAMUSCULAR | Status: DC | PRN

## 2019-02-05 MED ORDER — CEFAZOLIN SODIUM-DEXTROSE 2-4 GM/100ML-% IV SOLN
INTRAVENOUS | Status: AC
  Filled 2019-02-05: qty 100

## 2019-02-05 MED ORDER — SODIUM CHLORIDE 0.9 % IR SOLN
Status: DC | PRN
  Administered 2019-02-05: 12000 mL

## 2019-02-05 MED ORDER — BACITRACIN-NEOMYCIN-POLYMYXIN 400-5-5000 EX OINT: 1.0000 "application " | TOPICAL_OINTMENT | Freq: Three times a day (TID) | CUTANEOUS | Status: DC | PRN

## 2019-02-05 MED ORDER — ONDANSETRON HCL 4 MG/2ML IJ SOLN: 4.0000 mg | Freq: Once | INTRAMUSCULAR | Status: DC | PRN

## 2019-02-05 MED ORDER — FENTANYL CITRATE (PF) 100 MCG/2ML IJ SOLN
INTRAMUSCULAR | Status: AC | PRN
  Administered 2019-02-05 (×2): 50 ug via INTRAVENOUS

## 2019-02-05 MED ORDER — FENTANYL CITRATE (PF) 100 MCG/2ML IJ SOLN
INTRAMUSCULAR | Status: DC | PRN
  Administered 2019-02-05 (×2): 50 ug via INTRAVENOUS

## 2019-02-05 MED ORDER — FENTANYL CITRATE (PF) 100 MCG/2ML IJ SOLN
INTRAMUSCULAR | Status: AC
  Filled 2019-02-05: qty 2

## 2019-02-05 MED ORDER — DEXAMETHASONE SODIUM PHOSPHATE 10 MG/ML IJ SOLN
INTRAMUSCULAR | Status: DC | PRN
  Administered 2019-02-05: 10 mg via INTRAVENOUS

## 2019-02-05 MED ORDER — IOHEXOL 300 MG/ML  SOLN
50.0000 mL | Freq: Once | INTRAMUSCULAR | Status: AC | PRN
  Administered 2019-02-05: 15 mL

## 2019-02-05 MED ORDER — FENTANYL CITRATE (PF) 250 MCG/5ML IJ SOLN
INTRAMUSCULAR | Status: AC
  Filled 2019-02-05: qty 5

## 2019-02-05 MED ORDER — HYDROMORPHONE HCL 1 MG/ML IJ SOLN: 0.5000 mg | INTRAMUSCULAR | Status: DC | PRN

## 2019-02-05 MED ORDER — LIDOCAINE-EPINEPHRINE 1 %-1:100000 IJ SOLN
INTRAMUSCULAR | Status: AC
  Filled 2019-02-05: qty 1

## 2019-02-05 MED ORDER — SODIUM CHLORIDE 0.9 % IV SOLN
INTRAVENOUS | Status: DC
  Administered 2019-02-05 – 2019-02-06 (×2): via INTRAVENOUS

## 2019-02-05 MED ORDER — ACETAMINOPHEN 500 MG PO TABS
1000.0000 mg | ORAL_TABLET | Freq: Four times a day (QID) | ORAL | Status: DC
  Administered 2019-02-05 – 2019-02-06 (×3): 1000 mg via ORAL
  Filled 2019-02-05 (×3): qty 2

## 2019-02-05 MED ORDER — IOHEXOL 300 MG/ML  SOLN
INTRAMUSCULAR | Status: DC | PRN
  Administered 2019-02-05: 8 mL

## 2019-02-05 MED ORDER — OXYCODONE-ACETAMINOPHEN 5-325 MG PO TABS: 1.0000 | ORAL_TABLET | ORAL | Status: DC | PRN

## 2019-02-05 MED ORDER — CEFAZOLIN SODIUM-DEXTROSE 2-4 GM/100ML-% IV SOLN: 2.0000 g | Freq: Once | INTRAVENOUS | Status: DC

## 2019-02-05 MED ORDER — ONDANSETRON HCL 4 MG/2ML IJ SOLN
INTRAMUSCULAR | Status: DC | PRN
  Administered 2019-02-05: 4 mg via INTRAVENOUS

## 2019-02-05 MED ORDER — ONDANSETRON HCL 4 MG/2ML IJ SOLN
INTRAMUSCULAR | Status: AC
  Filled 2019-02-05: qty 2

## 2019-02-05 MED ORDER — ONDANSETRON HCL 4 MG/2ML IJ SOLN: 4.0000 mg | INTRAMUSCULAR | Status: DC | PRN

## 2019-02-05 MED ORDER — HYDROMORPHONE HCL 1 MG/ML IJ SOLN
0.2500 mg | INTRAMUSCULAR | Status: DC | PRN
  Administered 2019-02-05 (×2): 0.5 mg via INTRAVENOUS

## 2019-02-05 SURGICAL SUPPLY — 61 items
AGENT HMST KT MTR STRL THRMB (HEMOSTASIS) ×1
APL ESCP 34 STRL LF DISP (HEMOSTASIS) ×1
APL PRP STRL LF DISP 70% ISPRP (MISCELLANEOUS) ×1
APL SKNCLS STERI-STRIP NONHPOA (GAUZE/BANDAGES/DRESSINGS) ×1
APPLICATOR SURGIFLO ENDO (HEMOSTASIS) ×2 IMPLANT
BAG URINE DRAINAGE (UROLOGICAL SUPPLIES) IMPLANT
BASKET STONE NITINOL 3FRX115MB (UROLOGICAL SUPPLIES) ×2 IMPLANT
BASKET ZERO TIP NITINOL 2.4FR (BASKET) IMPLANT
BENZOIN TINCTURE PRP APPL 2/3 (GAUZE/BANDAGES/DRESSINGS) ×2 IMPLANT
BLADE SURG SZ11 CARB STEEL (BLADE) ×2 IMPLANT
BNDG ADH 1X3 SHEER STRL LF (GAUZE/BANDAGES/DRESSINGS) ×2 IMPLANT
BNDG ADH THN 3X1 STRL LF (GAUZE/BANDAGES/DRESSINGS) ×1
BSKT STON RTRVL ZERO TP 2.4FR (BASKET)
CATH FOLEY 2W COUNCIL 20FR 5CC (CATHETERS) IMPLANT
CATH FOLEY 2WAY SLVR  5CC 18FR (CATHETERS)
CATH FOLEY 2WAY SLVR 5CC 18FR (CATHETERS) IMPLANT
CATH IMAGER II 65CM (CATHETERS) ×2 IMPLANT
CATH URET DUAL LUMEN 6-10FR 50 (CATHETERS) ×2 IMPLANT
CATH X-FORCE N30 NEPHROSTOMY (TUBING) ×2 IMPLANT
CHLORAPREP W/TINT 26 (MISCELLANEOUS) ×2 IMPLANT
COVER SURGICAL LIGHT HANDLE (MISCELLANEOUS) ×2 IMPLANT
COVER WAND RF STERILE (DRAPES) IMPLANT
DRAPE C-ARM 42X120 X-RAY (DRAPES) ×2 IMPLANT
DRAPE LINGEMAN PERC (DRAPES) ×2 IMPLANT
DRAPE SURG IRRIG POUCH 19X23 (DRAPES) ×2 IMPLANT
DRSG PAD ABDOMINAL 8X10 ST (GAUZE/BANDAGES/DRESSINGS) ×4 IMPLANT
DRSG TEGADERM 4X4.75 (GAUZE/BANDAGES/DRESSINGS) ×2 IMPLANT
DRSG TEGADERM 8X12 (GAUZE/BANDAGES/DRESSINGS) ×4 IMPLANT
FIBER LASER FLEXIVA 365 (UROLOGICAL SUPPLIES) ×2 IMPLANT
FIBER LASER TRAC TIP (UROLOGICAL SUPPLIES) IMPLANT
GAUZE SPONGE 4X4 12PLY STRL (GAUZE/BANDAGES/DRESSINGS) IMPLANT
GLOVE BIOGEL M 8.0 STRL (GLOVE) ×2 IMPLANT
GOWN STRL REUS W/TWL XL LVL3 (GOWN DISPOSABLE) ×2 IMPLANT
GUIDEWIRE AMPLAZ .035X145 (WIRE) ×6 IMPLANT
GUIDEWIRE STR DUAL SENSOR (WIRE) ×2 IMPLANT
HOLDER FOLEY CATH W/STRAP (MISCELLANEOUS) ×2 IMPLANT
KIT BASIN OR (CUSTOM PROCEDURE TRAY) ×2 IMPLANT
KIT PROBE 340X3.4XDISP GRN (MISCELLANEOUS) IMPLANT
KIT PROBE TRILOGY 3.4X340 (MISCELLANEOUS)
KIT PROBE TRILOGY 3.9X350 (MISCELLANEOUS) IMPLANT
KIT TURNOVER KIT A (KITS) ×2 IMPLANT
MANIFOLD NEPTUNE II (INSTRUMENTS) ×2 IMPLANT
NS IRRIG 1000ML POUR BTL (IV SOLUTION) IMPLANT
PACK CYSTO (CUSTOM PROCEDURE TRAY) ×2 IMPLANT
PAD POSITIONING PINK XL (MISCELLANEOUS) ×2 IMPLANT
SHEATH PEELAWAY SET 9 (SHEATH) ×2 IMPLANT
SPONGE LAP 4X18 RFD (DISPOSABLE) ×2 IMPLANT
STENT URET 6FRX24 CONTOUR (STENTS) ×2 IMPLANT
STOPCOCK 4 WAY LG BORE MALE ST (IV SETS) IMPLANT
SURGIFLO W/THROMBIN 8M KIT (HEMOSTASIS) ×2 IMPLANT
SUT MNCRL AB 4-0 PS2 18 (SUTURE) ×2 IMPLANT
SUT SILK 2 0 30  PSL (SUTURE)
SUT SILK 2 0 30 PSL (SUTURE) IMPLANT
SYR 10ML LL (SYRINGE) ×2 IMPLANT
SYR 20ML LL LF (SYRINGE) ×4 IMPLANT
TOWEL OR 17X26 10 PK STRL BLUE (TOWEL DISPOSABLE) ×2 IMPLANT
TOWEL OR NON WOVEN STRL DISP B (DISPOSABLE) IMPLANT
TRAY FOLEY MTR SLVR 16FR STAT (SET/KITS/TRAYS/PACK) ×2 IMPLANT
TUBING CONNECTING 10 (TUBING) ×4 IMPLANT
TUBING STONE CATCHER TRILOGY (MISCELLANEOUS) IMPLANT
TUBING UROLOGY SET (TUBING) ×2 IMPLANT

## 2019-02-05 NOTE — Procedures (Signed)
Interventional Radiology Procedure Note  Procedure: Placement of a right nephroureteral tube (40F catheter) for subsequent PCNL access.   Complications: None  Estimated Blood Loss: None  Recommendations: - To OR for PCNL  Signed,  Criselda Peaches, MD

## 2019-02-05 NOTE — Plan of Care (Signed)
  Problem: Activity: Goal: Risk for activity intolerance will decrease Outcome: Progressing   Problem: Nutrition: Goal: Adequate nutrition will be maintained Outcome: Progressing   Problem: Pain Managment: Goal: General experience of comfort will improve Outcome: Progressing   Problem: Education: Goal: Knowledge of General Education information will improve Description: Including pain rating scale, medication(s)/side effects and non-pharmacologic comfort measures Outcome: Completed/Met   Problem: Health Behavior/Discharge Planning: Goal: Ability to manage health-related needs will improve Outcome: Completed/Met

## 2019-02-05 NOTE — Op Note (Addendum)
PATIENT:  Cody Zamora  PRE-OPERATIVE DIAGNOSIS:   POST-OPERATIVE DIAGNOSIS: Same  PROCEDURE: 1. Percutaneous nephrostomy sheath placement. 2.  Right/left percutaneous nephrolithotomy ( 3 cm.) 3.  Antegrade ureteroscopy and laser lithotripsy with stone extraction 4. Antegrade double-J stent placement. 5.  Fluoroscopy time greater than 1 hour less than 2 hours.  SURGEON:  Claybon Jabs  INDICATION: Cody Zamora is a 68 year old male with right-sided flank pain who initially was seen in 2/20.  A KUB at that time revealed a large stone in his right kidney.  It appeared to be a partial staghorn calculus that was in 2 pieces.  He was scheduled for surgery initially but postponed the procedure.  He returned with new right flank pain and had not passed any stones.  He therefore was rescheduled for his right percutaneous nephrolithotomy.  ANESTHESIA:  General  EBL:  Minimal  DRAINS: 6 French, 24 cm double-J stent in the right ureter and a 16 French Foley catheter in the bladder.  LOCAL MEDICATIONS USED:  None  SPECIMEN: Stone given to patient.  Description of procedure: After informed consent the patient was taken to the operating room and administered general endotracheal anesthesia. Once fully anesthetized the patient had an 53 French Foley catheter placed It was then moved from the stretcher onto the operating room table in a prone position with bony prominences padded and chest pads in place. The flank with exiting nephrostomy catheter was then sterilely prepped and draped in standard fashion. An official timeout was then performed.  Using the existing nephrostomy catheter access I passed a 0.038 inch floppy tipped guidewire down the ureter into the bladder under fluoroscopy.  This was left in place and the nephrostomy catheter was removed.  A transverse incision was made over the guidewire and a peel-away coaxial catheter was then passed over the guidewire and down the ureter under  fluoroscopy.  The inner portion of the coaxial system was then removed and a second guidewire was passed through this catheter and down the ureter but I met resistance at the mid ureteral level with the guidewire curling back upon itself.  I therefore passed the Kumpe catheter through the access catheter and attempted to manipulate a guidewire down the ureter beyond what seemed to be some form of obstruction.  This was unsuccessful so I left the safety guidewire in place and removed the coaxial catheter completely and replaced this with a double-lumen ureteral catheter.  I was able to pass this into the bladder without difficulty under fluoroscopy.  I then passed a second guidewire through this ureteral catheter and then left both of these in place and remove the ureteral catheter with one of the guidewires then secured to the drape as a safety guidewire and the second guidewire was used as a working guidewire.  The NephroMax nephrostomy dilating balloon was then passed over the working guidewire into the area of the renal pelvis under fluoroscopy.  It was then inflated using dilute contrast under fluoroscopy until the balloon was fully inflated.  I then passed the 28 French nephrostomy access sheath over the balloon into the area of the renal pelvis under fluoroscopy and then deflated the balloon and removed the dilating balloon.  The 41 French rigid nephroscope was then passed under direct vision through the nephrostomy access sheath.  Clotted blood was evacuated and stone material was identified.  This was grasped with a 2 prong grasper and removed from the area of the renal pelvis.  I did not  see any other stone in the area of the renal pelvis so I passed the ureteroscope through the access sheath and into the proximal ureter were identified a large stone.  This was too large to engage in a basket.  I switched to the flexible cystoscope and used a 365 m holmium laser fiber to fragment the stone and then  used the 0 tip nitinol basket to extract all of the stone fragments.  Once this was completed I reinserted the flexible ureteroscope and passed down the ureter and found that in the mid ureter was another stone that appeared to have been impacted and had been present for some time as there was inflammation of the ureter associated with the stone.  I again used the laser to fragment the stone and removed portions of this stone as well.  Reinspection bypassing the ureteroscope on down the ureter further allow me to identify further stones that were present within the ureter.  These were engaged in the 0 tip basket and removed.  Finally I passed the flexible ureteroscope down the ureter to the level of the intramural ureter and confirmed this with fluoroscopy.  I then withdrew the flexible ureteroscope under direct vision and noted no further stones or evidence of ureteral injury.  I backloaded the rigid nephroscope over the guidewire and then passed the 6 French, 24 cm stent over the guidewire.  I then removed the guidewire under fluoroscopy noting good curl within the bladder and also good curl in the area of the renal pelvis.  There was very little bleeding throughout the whole procedure so I elected to proceed with no nephrostomy tube and therefore removed the second guidewire and then confirmed again that the stent was still in good position in the bladder and renal pelvis.  I measured the distance to the renal parenchyma and then injected FloSeal using a laparoscopic introducer into the location of the renal parenchyma and in the nephrostomy tract as I removed the nephrostomy sheath.  I then closed the skin with a running, subcuticular 4-0 Monocryl suture and sterile 2 x 2 gauze with a Tegaderm were applied.  The patient was awakened and taken to the recovery room in stable and satisfactory condition.  He tolerated the procedure well with no intraoperative complications.    PLAN OF CARE: Discharge to home  after an overnight stay.  PATIENT DISPOSITION:  PACU - hemodynamically stable.

## 2019-02-05 NOTE — Anesthesia Postprocedure Evaluation (Signed)
Anesthesia Post Note  Patient: Cody Zamora  Procedure(s) Performed: NEPHROLITHOTOMY PERCUTANEOUS, STENT PLACEMENT (Right ) HOLMIUM LASER APPLICATION (Right )     Patient location during evaluation: PACU Anesthesia Type: General Level of consciousness: awake and alert Pain management: pain level controlled Vital Signs Assessment: post-procedure vital signs reviewed and stable Respiratory status: spontaneous breathing, nonlabored ventilation, respiratory function stable and patient connected to nasal cannula oxygen Cardiovascular status: blood pressure returned to baseline and stable Postop Assessment: no apparent nausea or vomiting Anesthetic complications: no    Last Vitals:  Vitals:   02/05/19 1400 02/05/19 1415  BP: (!) 149/92 (!) 152/83  Pulse: 73 71  Resp: (!) 8 12  Temp:    SpO2: 94% 97%    Last Pain:  Vitals:   02/05/19 1415  TempSrc:   PainSc: 5                  Barnet Glasgow

## 2019-02-05 NOTE — H&P (Signed)
Chief Complaint: Patient was seen in consultation today for right renal calculus  Referring Physician(s): Kathie Rhodes  Supervising Physician: Jacqulynn Cadet  Patient Status: Cody Zamora - Out-pt  History of Present Illness: Cody Zamora is a 68 y.o. male with past medical history of HTN, DM2, prostate cancer, and kidney stones who was initially seen in February for his current episode of stone-related pain.  He was suspected to have a right renal calculus and plans made for nephrolithotomy, however his surgery was postponed. He has continued to have right-sided flank pain and has not noticed any passage of the stone.  He presents today for percutaneous nephrostomy vs. Nephroureteral stent placement for stone retreival in the OR by Dr. Karsten Ro today.   [name] presents in his usual state of health today.  He reports he continues to have increased right back/flank pain, sometimes pain on the left as well.  He has been NPO.  He does not take blood thinners. Denies fever, chills, nausea, vomiting, abdominal pain, cough, shortness of breath, dysuria, hematuria.   Past Medical History:  Diagnosis Date  . Asthma    as a child  . Cancer [facility])    prostate  . ED (erectile dysfunction) of organic origin   . Gout   . History of colon polyps   . History of kidney stones   . History of positive PPD    06/ 2006--  CXR NORMAL  . History of prostate cancer    DEC 2008--  S/P  RADICAL PROSTATECTOMY  . Hyperlipidemia   . Hypertension   . Hypogonadism male   . Mixed hyperlipidemia   . Nocturia   . Sigmoid diverticulosis    MILD  . Type 2 diabetes mellitus ([place])    type 2    Past Surgical History:  Procedure Laterality Date  . ACHILLES TENDON REPAIR Right 1989  . COLONOSCOPY N/A last one 10-28-2010    due 10 yr f/u in 2022 - [facility]  . FINGER AMPUTATION  age 13   left [facility]  . NEGATIVE SLEEP STUDY  2010  per pt  . PENILE PROSTHESIS IMPLANT N/A 10/28/2014   Procedure: IMPLANTATION  3 PIECE PENILE INFLATABLE PROTHESIS/COLOPLAST SCROTAL APPROACH;  Surgeon: Kathie Rhodes, MD;  Location: [facility];  Service: Urology;  Laterality: N/A;  . PERCUTANEOUS NEPHROLITHOTRIPSY Left 04-04-2009// second look 04-15-2009  . ROBOT ASSISTED LAPAROSCOPIC RADICAL PROSTATECTOMY  Dec 2008  . SHOULDER OPEN ROTATOR CUFF REPAIR Right 05-11-2006  . URETEROLITHOTOMY  2002    Allergies: Cialis [tadalafil], Fructose, and Morphine and related  Medications: Prior to Admission medications   Medication Sig Start Date End Date Taking? Authorizing Provider  allopurinol (ZYLOPRIM) 300 MG tablet Take ONLY 1 tablet Daily to prevent Gout (maximum 1 tablet /day) Patient taking differently: Take 300 mg by mouth daily.  07/21/18   [name], MD  aspirin 81 MG tablet Take 81 mg by mouth daily.      [provider]  bisoprolol-hydrochlorothiazide (ZIAC) 5-6.25 MG tablet Take 1 tablet by mouth daily. for blood pressure Patient taking differently: Take 1 tablet by mouth daily.  01/25/19   Vicie Mutters, PA-C  Cholecalciferol (VITAMIN D3) 2000 UNITS capsule Take 8,000 Units by mouth daily.    [provider]  diclofenac sodium (VOLTAREN) 1 % GEL Apply 4 g topically 4 (four) times daily. Patient taking differently: Apply 4 g topically 4 (four) times daily as needed (joint pain).  10/09/18   Liane Comber, NP  ezetimibe (ZETIA) 10  MG tablet Take 1 tablet (10 mg total) by mouth daily. 01/25/19   Vicie Mutters, PA-C  fluticasone [facility]) 50 MCG/ACT nasal spray Place 1 spray into both nostrils daily as needed for allergies. Patient taking differently: Place 2 sprays into both nostrils daily as needed for allergies.  01/03/18   Vicie Mutters, PA-C  metFORMIN (GLUCOPHAGE) 500 MG tablet Take  2 tablets 2 x /day with meals for Diabetes Patient taking differently: Take 1,000 mg by mouth at bedtime. Take  2 tablets 2 x /day with meals for Diabetes 10/29/18   [name], MD   Multiple Vitamins-Minerals (MULTIVITAMIN WITH MINERALS) tablet Take 1 tablet by mouth daily.      [provider]  Omega-3 Fatty Acids (FISH OIL) 1000 MG CAPS Take 1,000 mg by mouth daily.     [provider]  valsartan (DIOVAN) 320 MG tablet Take 1 tablet (320 mg total) by mouth daily. 10/09/18   Liane Comber, NP  Wheat Dextrin (BENEFIBER DRINK MIX PO) Take 1 Package by mouth daily.     [provider]     Family History  Problem Relation Age of Onset  . Heart disease Mother   . Diabetes Mother   . Arthritis Mother   . Alcohol abuse Father   . Hypertension Father     Social History   Socioeconomic History  . Marital status: Married    Spouse name: Not on file  . Number of children: Not on file  . Years of education: Not on file  . Highest education level: Not on file  Occupational History  . Not on file  Social Needs  . Financial resource strain: Not on file  . Food insecurity    Worry: Not on file    Inability: Not on file  . Transportation needs    Medical: Not on file    Non-medical: Not on file  Tobacco Use  . Smoking status: Never Smoker  . Smokeless tobacco: Never Used  Substance and Sexual Activity  . Alcohol use: No  . Drug use: No  . Sexual activity: Not Currently  Lifestyle  . Physical activity    Days per week: Not on file    Minutes per session: Not on file  . Stress: Not on file  Relationships  . Social Herbalist on phone: Not on file    Gets together: Not on file    Attends religious service: Not on file    Active member of club or organization: Not on file    Attends meetings of clubs or organizations: Not on file    Relationship status: Not on file  Other Topics Concern  . Not on file  Social History Narrative  . Not on file     Review of Systems: A 12 point ROS discussed and pertinent positives are indicated in the HPI above.  All other systems are negative.  Review of Systems  Constitutional:  Negative for fatigue and fever.  Respiratory: Negative for cough and shortness of breath.   Cardiovascular: Negative for chest pain.  Gastrointestinal: Negative for abdominal pain.  Genitourinary: Positive for flank pain. Negative for dysuria.  Musculoskeletal: Positive for back pain.  Psychiatric/Behavioral: Negative for behavioral problems and confusion.    Vital Signs: BP 132/78   Pulse (!) 57   Temp 98.5 F (36.9 C) (Oral)   Resp 18   SpO2 99%   Physical Exam Vitals signs and nursing note reviewed.  Constitutional:  General: He is not in acute distress.    Appearance: Normal appearance. He is not ill-appearing.  HENT:     Mouth/Throat:     Mouth: Mucous membranes are moist.     Pharynx: Oropharynx is clear.  Cardiovascular:     Rate and Rhythm: Normal rate and regular rhythm.  Pulmonary:     Effort: Pulmonary effort is normal. No respiratory distress.     Breath sounds: Normal breath sounds.  Abdominal:     General: Abdomen is flat.     Palpations: Abdomen is soft.  Skin:    General: Skin is warm and dry.  Neurological:     General: No focal deficit present.     Mental Status: He is alert and oriented to person, place, and time. Mental status is at baseline.  Psychiatric:        Mood and Affect: Mood normal.        Behavior: Behavior normal.        Thought Content: Thought content normal.        Judgment: Judgment normal.     MD Evaluation Airway: WNL Heart: WNL Abdomen: WNL Chest/ Lungs: WNL ASA  Classification: 3 Mallampati/Airway Score: One   Imaging: Xr Shoulder Right  Result Date: 01/30/2019 Films of the right shoulder demonstrate 5 metallic anchors from prior rotator cuff tear repair.  No obvious glenohumeral arthritis.  AC joint was not resected but with minimal degenerative changes.  There are some sclerotic changes about the greater tuberosity.  Some inferior prominence of the acromion.  Appears to be type I-II.   Labs:  CBC: Recent  Labs    07/20/18 0920 10/25/18 0928 01/25/19 1038 02/05/19 0825  WBC 8.5 8.4 7.7 10.1  HGB 14.7 14.4 14.7 14.5  HCT 43.8 42.6 43.3 44.0  PLT 254 242 282 273    COAGS: No results for input(s): INR, APTT in the last 8760 hours.  BMP: Recent Labs    07/20/18 0920 10/25/18 0928 01/25/19 1038 02/05/19 0825  NA 139 137 138 136  K 4.1 4.2 4.5 4.1  CL 105 103 105 103  CO2 27 26 27 24   GLUCOSE 115* 108* 111* 117*  BUN 16 15 24 23   CALCIUM 9.9 9.6 10.4* 9.5  CREATININE 1.08 1.10 1.40* 1.28*  GFRNONAA 70 68 51* 56*  GFRAA 81 79 59* >60    LIVER FUNCTION TESTS: Recent Labs    04/14/18 1106 07/20/18 0920 10/25/18 0928 01/25/19 1038  BILITOT 0.5 0.3 0.3 0.3  AST 32 26 29 27   ALT 34 29 29 28   PROT 7.8 7.5 7.2 7.7    TUMOR MARKERS: No results for input(s): AFPTM, CEA, CA199, CHROMGRNA in the last 8760 hours.  Assessment and Plan: Patient with past medical history of DM, HTN, kidney stones presents with complaint of right flank pain suspicious for right renal stone.  IR consulted for right nephrostomy vs. Nephroureteral stent placement at the request of Dr. Karsten Ro. Case reviewed by Dr. Laurence Ferrari who approves patient for procedure.  Patient presents today in their usual state of health.  He has been NPO and is not currently on blood thinners.   Risks and benefits of right PCN placement was discussed with the patient including, but not limited to, infection, bleeding, significant bleeding causing loss or decrease in renal function or damage to adjacent structures.   All of the patient's questions were answered, patient is agreeable to proceed.  Consent signed and in chart.  Thank you for this interesting  consult.  I greatly enjoyed meeting ROGAN ASBURY and look forward to participating in their care.  A copy of this report was sent to the requesting provider on this date.  Electronically Signed: Docia Barrier, PA 02/05/2019, 8:53 AM   I spent a  total of  30 Minutes   in face to face in clinical consultation, greater than 50% of which was counseling/coordinating care for right renal calculus.

## 2019-02-05 NOTE — Anesthesia Procedure Notes (Signed)
Procedure Name: Intubation Date/Time: 02/05/2019 11:16 AM Performed by: Montel Clock, CRNA Pre-anesthesia Checklist: Patient identified, Emergency Drugs available, Suction available, Patient being monitored and Timeout performed Patient Re-evaluated:Patient Re-evaluated prior to induction Oxygen Delivery Method: Circle system utilized Preoxygenation: Pre-oxygenation with 100% oxygen Induction Type: IV induction and Rapid sequence Laryngoscope Size: Mac and 3 Grade View: Grade I Tube type: Oral Tube size: 7.5 mm Number of attempts: 1 Airway Equipment and Method: Stylet Placement Confirmation: ETT inserted through vocal cords under direct vision,  positive ETCO2 and breath sounds checked- equal and bilateral Secured at: 23 cm Tube secured with: Tape Dental Injury: Teeth and Oropharynx as per pre-operative assessment

## 2019-02-05 NOTE — Transfer of Care (Signed)
Immediate Anesthesia Transfer of Care Note  Patient: Cody Zamora  Procedure(s) Performed: NEPHROLITHOTOMY PERCUTANEOUS, STENT PLACEMENT (Right ) HOLMIUM LASER APPLICATION (Right )  Patient Location: PACU  Anesthesia Type:General  Level of Consciousness: drowsy and patient cooperative  Airway & Oxygen Therapy: Patient Spontanous Breathing and Patient connected to face mask oxygen  Post-op Assessment: Report given to RN and Post -op Vital signs reviewed and stable  Post vital signs: Reviewed and stable  Last Vitals:  Vitals Value Taken Time  BP    Temp    Pulse    Resp    SpO2      Last Pain:  Vitals:   02/05/19 1048  TempSrc: Oral  PainSc: 9       Patients Stated Pain Goal: 7 ([id number])  Complications: No apparent anesthesia complications

## 2019-02-06 ENCOUNTER — Encounter (HOSPITAL_COMMUNITY): Payer: Self-pay | Admitting: Urology

## 2019-02-06 DIAGNOSIS — Z7984 Long term (current) use of oral hypoglycemic drugs: Secondary | ICD-10-CM | POA: Diagnosis not present

## 2019-02-06 DIAGNOSIS — Z91018 Allergy to other foods: Secondary | ICD-10-CM | POA: Diagnosis not present

## 2019-02-06 DIAGNOSIS — Z885 Allergy status to narcotic agent status: Secondary | ICD-10-CM | POA: Diagnosis not present

## 2019-02-06 DIAGNOSIS — Z888 Allergy status to other drugs, medicaments and biological substances status: Secondary | ICD-10-CM | POA: Diagnosis not present

## 2019-02-06 DIAGNOSIS — Z7982 Long term (current) use of aspirin: Secondary | ICD-10-CM | POA: Diagnosis not present

## 2019-02-06 DIAGNOSIS — N202 Calculus of kidney with calculus of ureter: Secondary | ICD-10-CM | POA: Diagnosis not present

## 2019-02-06 MED ORDER — HYDROCODONE-ACETAMINOPHEN 10-325 MG PO TABS: 1.0000 | ORAL_TABLET | ORAL | 0 refills | Status: DC | PRN

## 2019-02-06 NOTE — Discharge Summary (Signed)
Physician Discharge Summary      Patient ID: Cody Zamora MRN: [id number] DOB/AGE: 1950-09-07 68 y.o.  Admit date: 02/05/2019 Discharge date: 02/06/2019  Admission Diagnoses: RIGHT RENAL CALCULI  Discharge Diagnoses:  Active Problems:   Renal calculus, right Right ureteral calculi  Discharged Condition: good  Hospital Course: The patient was admitted for management of a large right renal calculus.  He had been experiencing some pain.  His stone appeared to have moved into the upper ureter near the UPJ.  He underwent PCNL and at the time of surgery was found to have additional stones within the ureter.  These were all removed and a stent was placed.  A postoperative CT scan revealed several fragments that appear to be of passable size within the right kidney.  He tolerated his stent.  His urine cleared.  The following day he was doing well with minimal discomfort.  His Foley catheter was removed and he was sent home.  He will follow up in the office for stent removal.  Discharge Exam: Blood pressure (!) 107/57, pulse (!) 53, temperature 98 F (36.7 C), temperature source Oral, resp. rate 17, height 5\' 5"  (1.651 m), weight 83.9 kg, SpO2 94 %. General: Awake, alert and in no apparent distress. Chest: Normal respiratory effort. Cardiovascular: Regular rate and rhythm. Abdomen: Soft, nontender, nondistended. Back: Dressing is intact and dry.   Disposition: Discharge disposition: 01-Home or Self Care       Discharge Instructions    Discharge patient   Complete by: As directed    Discharge disposition: 01-Home or Self Care   Discharge patient date: 02/06/2019   Foley catheter - discontinue   Complete by: As directed      Allergies as of 02/06/2019      Reactions   Cialis [tadalafil] Other (See Comments)   Hot flashes   Fructose Nausea And Vomiting   Morphine And Related Itching      Medication List    TAKE these medications   allopurinol 300 MG tablet Commonly  known as: Zyloprim Take ONLY 1 tablet Daily to prevent Gout (maximum 1 tablet /day) What changed:   how much to take  how to take this  when to take this  additional instructions   aspirin 81 MG tablet Take 81 mg by mouth daily.   BENEFIBER DRINK MIX PO Take 1 Package by mouth daily.   bisoprolol-hydrochlorothiazide 5-6.25 MG tablet Commonly known as: Ziac Take 1 tablet by mouth daily. for blood pressure What changed: additional instructions   diclofenac sodium 1 % Gel Commonly known as: VOLTAREN Apply 4 g topically 4 (four) times daily. What changed:   when to take this  reasons to take this   ezetimibe 10 MG tablet Commonly known as: ZETIA Take 1 tablet (10 mg total) by mouth daily.   Fish Oil 1000 MG Caps Take 1,000 mg by mouth daily.   fluticasone 50 MCG/ACT nasal spray Commonly known as: Flonase Place 1 spray into both nostrils daily as needed for allergies. What changed: how much to take   HYDROcodone-acetaminophen 10-325 MG tablet Commonly known as: NORCO Take 1-2 tablets by mouth every 4 (four) hours as needed for moderate pain. Maximum dose per 24 hours - 8 pills   metFORMIN 500 MG tablet Commonly known as: Glucophage Take  2 tablets 2 x /day with meals for Diabetes What changed:   how much to take  how to take this  when to take this   multivitamin with minerals  tablet Take 1 tablet by mouth daily.   valsartan 320 MG tablet Commonly known as: DIOVAN Take 1 tablet (320 mg total) by mouth daily.   Vitamin D3 50 MCG (2000 UT) capsule Take 8,000 Units by mouth daily.      Follow-up Information    [facility] On 02/12/2019.   Why: For your appiontment at 8:00 Contact information: [place]          Signed: Claybon Jabs 02/06/2019, 7:17 AM

## 2019-02-12 DIAGNOSIS — N200 Calculus of kidney: Secondary | ICD-10-CM | POA: Diagnosis not present

## 2019-03-12 ENCOUNTER — Other Ambulatory Visit: Payer: Self-pay | Admitting: Internal Medicine

## 2019-03-12 DIAGNOSIS — M1A9XX0 Chronic gout, unspecified, without tophus (tophi): Secondary | ICD-10-CM

## 2019-03-26 DIAGNOSIS — N200 Calculus of kidney: Secondary | ICD-10-CM | POA: Diagnosis not present

## 2019-03-29 ENCOUNTER — Other Ambulatory Visit: Payer: Self-pay | Admitting: Physician Assistant

## 2019-03-29 DIAGNOSIS — I10 Essential (primary) hypertension: Secondary | ICD-10-CM

## 2019-05-03 ENCOUNTER — Encounter: Payer: Self-pay | Admitting: Internal Medicine

## 2019-05-03 NOTE — Patient Instructions (Signed)

## 2019-05-03 NOTE — Progress Notes (Signed)
Comprehensive Evaluation & Examination     This very nice 69 y.o. MBM  presents for a  comprehensive evaluation and management of multiple medical co-morbidities.  Patient has been followed for HTN, HLD, T2_NIDDM and Vitamin D Deficiency. Patient has Gout controlled on his Allopurinol. In 2008, he had Robotic Prostatectomy for Prostate Ca. In 2016, he had a penile pump. In Oct 2020, patient had PCNL by Dr Karsten Ro for multiple Rt Renal calculi.      HTN predates circa 1996. Patient's BP has been controlled at home.  Today's BP is at goal -  134/84. Patient denies any cardiac symptoms as chest pain, palpitations, shortness of breath, dizziness or ankle swelling.     Patient's hyperlipidemia is controlled with diet and medications. Patient denies myalgias or other medication SE's. Last lipids were at goal:  Lab Results  Component Value Date   CHOL 144 05/04/2019   HDL 31 (L) 05/04/2019   LDLCALC 84 05/04/2019   TRIG 196 (H) 05/04/2019   CHOLHDL 4.6 05/04/2019      Patient has hx/o T2_NIDDM (2011) with CKD2  and patient denies reactive hypoglycemic symptoms, visual blurring, diabetic polys or paresthesias. Last A1c was not at goal:  Lab Results  Component Value Date   HGBA1C 6.3 (H) 05/04/2019       Finally, patient has history of Vitamin D Deficiency ("11" / 2008) and last vitamin D was at goal:  Lab Results  Component Value Date   VD25OH 51 05/04/2019   Current Outpatient Medications on File Prior to Visit  Medication Sig  . allopurinol (ZYLOPRIM) 300 MG tablet Take 1 tablet Daily to Prevent Gout  . aspirin 81 MG tablet Take 81 mg by mouth daily.    . bisoprolol-hydrochlorothiazide (ZIAC) 5-6.25 MG tablet Take 1 tablet Daily for BP  . diclofenac sodium (VOLTAREN) 1 % GEL Apply 4 g topically 4 (four) times daily. (Patient taking differently: Apply 4 g topically 4 (four) times daily as needed (joint pain). )  . ezetimibe (ZETIA) 10 MG tablet Take 1 tablet (10 mg total) by mouth  daily.  . fluticasone (FLONASE) 50 MCG/ACT nasal spray Place 1 spray into both nostrils daily as needed for allergies. (Patient taking differently: Place 2 sprays into both nostrils daily as needed for allergies. )  . HYDROcodone-acetaminophen (NORCO) 10-325 MG tablet Take 1-2 tablets by mouth every 4 (four) hours as needed for moderate pain. Maximum dose per 24 hours - 8 pills  . metFORMIN (GLUCOPHAGE) 500 MG tablet Take  2 tablets 2 x /day with meals for Diabetes (Patient taking differently: Take 1,000 mg by mouth at bedtime. Take  2 tablets 2 x /day with meals for Diabetes)  . Multiple Vitamins-Minerals (MULTIVITAMIN WITH MINERALS) tablet Take 1 tablet by mouth daily.    . Omega-3 Fatty Acids (FISH OIL) 1000 MG CAPS Take 1,000 mg by mouth daily.   . valsartan (DIOVAN) 320 MG tablet Take 1 tablet (320 mg total) by mouth daily.  Marland Kitchen VITAMIN D PO Take 5,000 Units by mouth daily.  . Wheat Dextrin (BENEFIBER DRINK MIX PO) Take 1 Package by mouth daily.    No current facility-administered medications on file prior to visit.   Allergies  Allergen Reactions  . Cialis [Tadalafil] Other (See Comments)    Hot flashes  . Fructose Nausea And Vomiting  . Morphine And Related Itching   Past Medical History:  Diagnosis Date  . Asthma    as a child  . Cancer ([place])  prostate  . ED (erectile dysfunction) of organic origin   . Gout   . History of colon polyps   . History of kidney stones   . History of positive PPD    06/ 2006--  CXR NORMAL  . History of prostate cancer    DEC 2008--  S/P  RADICAL PROSTATECTOMY  . Hyperlipidemia   . Hypertension   . Hypogonadism male   . Mixed hyperlipidemia   . Nocturia   . Sigmoid diverticulosis    MILD  . Type 2 diabetes mellitus ([place])    type 2   Health Maintenance  Topic Date Due  . OPHTHALMOLOGY EXAM  05/06/2017  . HEMOGLOBIN A1C  11/01/2019  . FOOT EXAM  05/02/2020  . COLONOSCOPY  10/27/2020  . TETANUS/TDAP  06/28/2021  . INFLUENZA VACCINE   Completed  . Hepatitis C Screening  Completed  . PNA vac Low Risk Adult  Completed   Immunization History  Administered Date(s) Administered  . DTaP 02/17/2002  . Influenza Whole 01/08/2013  . Influenza, High Dose Seasonal PF 02/26/2014, 12/24/2015, 03/21/2017, 04/14/2018, 12/27/2018  . Pneumococcal Conjugate-13 07/23/2015  . Pneumococcal Polysaccharide-23 02/18/1996, 01/03/2018  . Tdap 06/29/2011  . Zoster 03/23/2011   Last Colon -  10/28/2010 - Dr Deatra Ina - Recc 10 yr f/u  - due July 2022.   Past Surgical History:  Procedure Laterality Date  . ACHILLES TENDON REPAIR Right 1989  . COLONOSCOPY N/A last one 10-28-2010    due 10 yr f/u in 2022 - [facility]  . FINGER AMPUTATION  age 26   left [place]  . HOLMIUM LASER APPLICATION Right [id number]   Procedure: HOLMIUM LASER APPLICATION;  Surgeon: Kathie Rhodes, MD;  Location: [facility];  Service: Urology;  Laterality: Right;  . IR URETERAL STENT RIGHT NEW ACCESS W/O SEP NEPHROSTOMY CATH  02/05/2019  . NEGATIVE SLEEP STUDY  2010  per pt  . NEPHROLITHOTOMY Right 02/05/2019   Procedure: NEPHROLITHOTOMY PERCUTANEOUS, STENT PLACEMENT;  Surgeon: Kathie Rhodes, MD;  Location: [facility];  Service: Urology;  Laterality: Right;  . PENILE PROSTHESIS IMPLANT N/A 10/28/2014   Procedure: IMPLANTATION 3 PIECE PENILE INFLATABLE PROTHESIS/COLOPLAST SCROTAL APPROACH;  Surgeon: Kathie Rhodes, MD;  Location: [facility];  Service: Urology;  Laterality: N/A;  . PERCUTANEOUS NEPHROLITHOTRIPSY Left 04-04-2009// second look 04-15-2009  . ROBOT ASSISTED LAPAROSCOPIC RADICAL PROSTATECTOMY  Dec 2008  . SHOULDER OPEN ROTATOR CUFF REPAIR Right 05-11-2006  . URETEROLITHOTOMY  2002   Family History  Problem Relation Age of Onset  . Heart disease Mother   . Diabetes Mother   . Arthritis Mother   . Alcohol abuse Father   . Hypertension Father    Social History   Socioeconomic History  . Marital status: Married    Spouse name: Vaughan Basta   . Number of children:  1 son & 1 daughter   Occupational History  .   Tobacco Use  . Smoking status: Never Smoker  . Smokeless tobacco: Never Used  Substance and Sexual Activity  . Alcohol use: No  . Drug use: No  . Sexual activity: Not Currently     ROS Constitutional: Denies fever, chills, weight loss/gain, headaches, insomnia,  night sweats or change in appetite. Does c/o fatigue. Eyes: Denies redness, blurred vision, diplopia, discharge, itchy or watery eyes.  ENT: Denies discharge, congestion, post nasal drip, epistaxis, sore throat, earache, hearing loss, dental pain, Tinnitus, Vertigo, Sinus pain or snoring.  Cardio: Denies chest pain, palpitations, irregular heartbeat, syncope, dyspnea, diaphoresis, orthopnea, PND, claudication  or edema Respiratory: denies cough, dyspnea, DOE, pleurisy, hoarseness, laryngitis or wheezing.  Gastrointestinal: Denies dysphagia, heartburn, reflux, water brash, pain, cramps, nausea, vomiting, bloating, diarrhea, constipation, hematemesis, melena, hematochezia, jaundice or hemorrhoids Genitourinary: Denies dysuria, frequency, urgency, nocturia, hesitancy, discharge, hematuria or flank pain Musculoskeletal: Denies arthralgia, myalgia, stiffness, Jt. Swelling, pain, limp or strain/sprain. Denies Falls. Skin: Denies puritis, rash, hives, warts, acne, eczema or change in skin lesion Neuro: No weakness, tremor, incoordination, spasms, paresthesia or pain Psychiatric: Denies confusion, memory loss or sensory loss. Denies Depression. Endocrine: Denies change in weight, skin, hair change, nocturia, and paresthesia, diabetic polys, visual blurring or hyper / hypo glycemic episodes.  Heme/Lymph: No excessive bleeding, bruising or enlarged lymph nodes.  Physical Exam  BP 134/84   Pulse 64   Temp (!) 97.4 F (36.3 C)   Resp 16   Ht 5' 5.5" (1.664 m)   Wt 202 lb 6.4 oz (91.8 kg)   BMI 33.17 kg/m   General Appearance: Well nourished and well groomed and in no apparent  distress.  Eyes: PERRLA, EOMs, conjunctiva no swelling or erythema, normal fundi and vessels. Sinuses: No frontal/maxillary tenderness ENT/Mouth: EACs patent / TMs  nl. Nares clear without erythema, swelling, mucoid exudates. Oral hygiene is good. No erythema, swelling, or exudate. Tongue normal, non-obstructing. Tonsils not swollen or erythematous. Hearing normal.  Neck: Supple, thyroid not palpable. No bruits, nodes or JVD. Respiratory: Respiratory effort normal.  BS equal and clear bilateral without rales, rhonci, wheezing or stridor. Cardio: Heart sounds are normal with regular rate and rhythm and no murmurs, rubs or gallops. Peripheral pulses are normal and equal bilaterally without edema. No aortic or femoral bruits. Chest: symmetric with normal excursions and percussion.  Abdomen: Soft, with Nl bowel sounds. Nontender, no guarding, rebound, hernias, masses, or organomegaly.  Lymphatics: Non tender without lymphadenopathy.  Musculoskeletal: Full ROM all peripheral extremities, joint stability, 5/5 strength, and normal gait. Skin: Warm and dry without rashes, lesions, cyanosis, clubbing or  ecchymosis.  Neuro: Cranial nerves intact, reflexes equal bilaterally. Normal muscle tone, no cerebellar symptoms. Sensation intact to touch, vibratory and Monofilament to the toes bilaterally.  Pysch: Alert and oriented X 3 with normal affect, insight and judgment appropriate.   Assessment and Plan  1. Essential hypertension  - EKG 12-Lead - [place], retroperitnl abd,  ltd - Urinalysis, Routine w reflex microscopic - Microalbumin / Creatinine Urine Ratio - CBC with Diff - COMPLETE METABOLIC PANEL WITH GFR - Magnesium - TSH  2. Hyperlipidemia associated with type 2 diabetes mellitus ([name])  - EKG 12-Lead - [place], retroperitnl abd,  ltd - Lipid Profile - TSH  3. Type 2 diabetes mellitus with stage 2 chronic kidney disease, without long-term current use of insulin ([name])  - EKG 12-Lead - [place],  retroperitnl abd,  ltd - Urinalysis, Routine w reflex microscopic - Microalbumin / Creatinine Urine Ratio - HM DIABETES FOOT EXAM - LOW EXTREMITY NEUR EXAM DOCUM - Hemoglobin A1c ([facility]) - Insulin, random  4. Vitamin D deficiency  - Vitamin D (25 hydroxy)  5. Gout  - Uric acid  6. History of prostate cancer  - PSA  7. Prostate cancer screening  - PSA  8. CKD stage 2 due to type 2 diabetes mellitus ([place])   9. Screening for colorectal cancer  - POC Hemoccult Bld/Stl (3-Cd Home Screen); Future  10. Screening for ischemic heart disease  - EKG 12-Lead  11. FHx: heart disease  - EKG 12-Lead - [place], retroperitnl abd,  ltd  12.  Screening for AAA (aortic abdominal ane - Urinalysis, Routine w reflex microscopic - Microalbumin / Creatinine Urine Ratio - CBC with Diff - COMPLETE METABOLIC PANEL WITH GFR - Magnesium - Lipid Profile - TSH - Hemoglobin A1c ([facility]) - Insulin, random - Vitamin D (25 hydroxy)         Patient was counseled in prudent diet, weight control to achieve/maintain BMI less than 25, BP monitoring, regular exercise and medications as discussed.  Discussed med effects and SE's. Routine screening labs and tests as requested with regular follow-up as recommended. Over 40 minutes of exam, counseling, chart review and high complex critical decision making was performed   Kirtland Bouchard, MD

## 2019-05-04 ENCOUNTER — Other Ambulatory Visit: Payer: Self-pay

## 2019-05-04 ENCOUNTER — Ambulatory Visit (INDEPENDENT_AMBULATORY_CARE_PROVIDER_SITE_OTHER): Payer: Medicare Other | Admitting: Internal Medicine

## 2019-05-04 VITALS — BP 134/84 | HR 64 | Temp 97.4°F | Resp 16 | Ht 65.5 in | Wt 202.4 lb

## 2019-05-04 DIAGNOSIS — Z8546 Personal history of malignant neoplasm of prostate: Secondary | ICD-10-CM | POA: Diagnosis not present

## 2019-05-04 DIAGNOSIS — Z125 Encounter for screening for malignant neoplasm of prostate: Secondary | ICD-10-CM | POA: Diagnosis not present

## 2019-05-04 DIAGNOSIS — M109 Gout, unspecified: Secondary | ICD-10-CM

## 2019-05-04 DIAGNOSIS — E785 Hyperlipidemia, unspecified: Secondary | ICD-10-CM | POA: Diagnosis not present

## 2019-05-04 DIAGNOSIS — E559 Vitamin D deficiency, unspecified: Secondary | ICD-10-CM

## 2019-05-04 DIAGNOSIS — Z8249 Family history of ischemic heart disease and other diseases of the circulatory system: Secondary | ICD-10-CM

## 2019-05-04 DIAGNOSIS — I10 Essential (primary) hypertension: Secondary | ICD-10-CM | POA: Diagnosis not present

## 2019-05-04 DIAGNOSIS — E1122 Type 2 diabetes mellitus with diabetic chronic kidney disease: Secondary | ICD-10-CM | POA: Diagnosis not present

## 2019-05-04 DIAGNOSIS — Z79899 Other long term (current) drug therapy: Secondary | ICD-10-CM

## 2019-05-04 DIAGNOSIS — N182 Chronic kidney disease, stage 2 (mild): Secondary | ICD-10-CM | POA: Diagnosis not present

## 2019-05-04 DIAGNOSIS — Z1211 Encounter for screening for malignant neoplasm of colon: Secondary | ICD-10-CM

## 2019-05-04 DIAGNOSIS — Z136 Encounter for screening for cardiovascular disorders: Secondary | ICD-10-CM

## 2019-05-04 DIAGNOSIS — B351 Tinea unguium: Secondary | ICD-10-CM

## 2019-05-04 DIAGNOSIS — E1169 Type 2 diabetes mellitus with other specified complication: Secondary | ICD-10-CM

## 2019-05-04 MED ORDER — TERBINAFINE HCL 250 MG PO TABS: ORAL_TABLET | ORAL | 1 refills | Status: AC

## 2019-05-05 ENCOUNTER — Encounter: Payer: Self-pay | Admitting: Internal Medicine

## 2019-05-08 LAB — COMPLETE METABOLIC PANEL WITH GFR
AG Ratio: 1.5 (calc) (ref 1.0–2.5)
ALT: 27 U/L (ref 9–46)
AST: 26 U/L (ref 10–35)
Albumin: 4.5 g/dL (ref 3.6–5.1)
Alkaline phosphatase (APISO): 70 U/L (ref 35–144)
BUN: 16 mg/dL (ref 7–25)
CO2: 29 mmol/L (ref 20–32)
Calcium: 9.7 mg/dL (ref 8.6–10.3)
Chloride: 104 mmol/L (ref 98–110)
Creat: 1.09 mg/dL (ref 0.70–1.18)
GFR, Est African American: 79 mL/min/{1.73_m2} (ref >=60)
GFR, Est Non African American: 68 mL/min/{1.73_m2} (ref >=60)
Globulin: 3 g/dL (calc) (ref 1.9–3.7)
Glucose, Bld: 110 mg/dL — ABNORMAL HIGH (ref 65–99)
Potassium: 4.3 mmol/L (ref 3.5–5.3)
Sodium: 139 mmol/L (ref 135–146)
Total Bilirubin: 0.3 mg/dL (ref 0.2–1.2)
Total Protein: 7.5 g/dL (ref 6.1–8.1)

## 2019-05-08 LAB — LIPID PANEL
Cholesterol: 144 mg/dL (ref <200)
HDL: 31 mg/dL — ABNORMAL LOW (ref >=40)
LDL Cholesterol (Calc): 84 mg/dL (calc)
Non-HDL Cholesterol (Calc): 113 mg/dL (calc) (ref <130)
Total CHOL/HDL Ratio: 4.6 (calc) (ref <5.0)
Triglycerides: 196 mg/dL — ABNORMAL HIGH (ref <150)

## 2019-05-08 LAB — URIC ACID: Uric Acid, Serum: 4.8 mg/dL (ref 4.0–8.0)

## 2019-05-08 LAB — CBC WITH DIFFERENTIAL/PLATELET
Absolute Monocytes: 585 cells/uL (ref 200–950)
Basophils Absolute: 38 cells/uL (ref 0–200)
Basophils Relative: 0.5 %
Eosinophils Absolute: 144 cells/uL (ref 15–500)
Eosinophils Relative: 1.9 %
HCT: 45.5 % (ref 38.5–50.0)
Hemoglobin: 15.1 g/dL (ref 13.2–17.1)
Lymphs Abs: 3108 cells/uL (ref 850–3900)
MCH: 30.2 pg (ref 27.0–33.0)
MCHC: 33.2 g/dL (ref 32.0–36.0)
MCV: 91 fL (ref 80.0–100.0)
MPV: 9.9 fL (ref 7.5–12.5)
Monocytes Relative: 7.7 %
Neutro Abs: 3724 cells/uL (ref 1500–7800)
Neutrophils Relative %: 49 %
Platelets: 232 10*3/uL (ref 140–400)
RBC: 5 10*6/uL (ref 4.20–5.80)
RDW: 13.4 % (ref 11.0–15.0)
Total Lymphocyte: 40.9 %
WBC: 7.6 10*3/uL (ref 3.8–10.8)

## 2019-05-08 LAB — MICROALBUMIN / CREATININE URINE RATIO
Creatinine, Urine: 88 mg/dL (ref 20–320)
Microalb Creat Ratio: 34 mcg/mg creat — ABNORMAL HIGH (ref <30)
Microalb, Ur: 3 mg/dL

## 2019-05-08 LAB — URINALYSIS, ROUTINE W REFLEX MICROSCOPIC
Bilirubin Urine: NEGATIVE
Glucose, UA: NEGATIVE
Hgb urine dipstick: NEGATIVE
Ketones, ur: NEGATIVE
Leukocytes,Ua: NEGATIVE
Nitrite: NEGATIVE
Protein, ur: NEGATIVE
Specific Gravity, Urine: 1.015 (ref 1.001–1.03)
pH: 6.5 (ref 5.0–8.0)

## 2019-05-08 LAB — INSULIN, RANDOM: Insulin: 33.9 u[IU]/mL — ABNORMAL HIGH

## 2019-05-08 LAB — HEMOGLOBIN A1C
Hgb A1c MFr Bld: 6.3 % of total Hgb — ABNORMAL HIGH (ref <5.7)
Mean Plasma Glucose: 134 (calc)
eAG (mmol/L): 7.4 (calc)

## 2019-05-08 LAB — PSA: PSA: <0.1 ng/mL (ref <=4.0)

## 2019-05-08 LAB — TSH: TSH: 0.97 mIU/L (ref 0.40–4.50)

## 2019-05-08 LAB — VITAMIN D 25 HYDROXY (VIT D DEFICIENCY, FRACTURES): Vit D, 25-Hydroxy: 51 ng/mL (ref 30–100)

## 2019-05-08 LAB — MAGNESIUM: Magnesium: 2 mg/dL (ref 1.5–2.5)

## 2019-05-23 ENCOUNTER — Other Ambulatory Visit: Payer: Self-pay | Admitting: *Deleted

## 2019-05-23 MED ORDER — PRODIGY NO CODING BLOOD GLUC VI STRP: ORAL_STRIP | 3 refills | Status: DC

## 2019-05-28 DIAGNOSIS — H524 Presbyopia: Secondary | ICD-10-CM | POA: Diagnosis not present

## 2019-05-28 DIAGNOSIS — H538 Other visual disturbances: Secondary | ICD-10-CM | POA: Diagnosis not present

## 2019-05-28 DIAGNOSIS — E0865 Diabetes mellitus due to underlying condition with hyperglycemia: Secondary | ICD-10-CM | POA: Diagnosis not present

## 2019-05-28 DIAGNOSIS — H11139 Conjunctival pigmentations, unspecified eye: Secondary | ICD-10-CM | POA: Diagnosis not present

## 2019-05-28 DIAGNOSIS — H25011 Cortical age-related cataract, right eye: Secondary | ICD-10-CM | POA: Diagnosis not present

## 2019-05-28 LAB — HM DIABETES EYE EXAM

## 2019-05-29 ENCOUNTER — Other Ambulatory Visit: Payer: Self-pay | Admitting: *Deleted

## 2019-05-29 MED ORDER — FREESTYLE LITE TEST VI STRP: ORAL_STRIP | 3 refills | Status: DC

## 2019-05-29 MED ORDER — FREESTYLE LANCETS MISC: 3 refills | Status: DC

## 2019-05-29 MED ORDER — FREESTYLE FREEDOM LITE W/DEVICE KIT: PACK | 0 refills | Status: DC

## 2019-06-20 ENCOUNTER — Other Ambulatory Visit: Payer: Self-pay | Admitting: Internal Medicine

## 2019-06-20 ENCOUNTER — Other Ambulatory Visit: Payer: Self-pay

## 2019-06-20 ENCOUNTER — Other Ambulatory Visit: Payer: Medicare Other

## 2019-06-20 DIAGNOSIS — E1169 Type 2 diabetes mellitus with other specified complication: Secondary | ICD-10-CM | POA: Diagnosis not present

## 2019-06-20 DIAGNOSIS — Z79899 Other long term (current) drug therapy: Secondary | ICD-10-CM | POA: Diagnosis not present

## 2019-06-20 DIAGNOSIS — B351 Tinea unguium: Secondary | ICD-10-CM

## 2019-06-20 DIAGNOSIS — Z1211 Encounter for screening for malignant neoplasm of colon: Secondary | ICD-10-CM

## 2019-06-21 LAB — HEPATIC FUNCTION PANEL
AG Ratio: 1.5 (calc) (ref 1.0–2.5)
ALT: 22 U/L (ref 9–46)
AST: 25 U/L (ref 10–35)
Albumin: 4.3 g/dL (ref 3.6–5.1)
Alkaline phosphatase (APISO): 70 U/L (ref 35–144)
Bilirubin, Direct: 0.1 mg/dL (ref 0.0–0.2)
Globulin: 2.9 g/dL (calc) (ref 1.9–3.7)
Indirect Bilirubin: 0.2 mg/dL (calc) (ref 0.2–1.2)
Total Bilirubin: 0.3 mg/dL (ref 0.2–1.2)
Total Protein: 7.2 g/dL (ref 6.1–8.1)

## 2019-08-06 NOTE — Progress Notes (Signed)
FOLLOW UP  Assessment and Plan:   Hypertension Well controlled with current medications  Monitor blood pressure at home; patient to call if consistently greater than 130/80 Continue DASH diet.   Reminder to go to the ER if any CP, SOB, nausea, dizziness, severe HA, changes vision/speech, left arm numbness and tingling and jaw pain.  Hyperlipidemia associated with T2DM ([place]) Currently above goal on zetia; hx of statin intolerance  Continue low cholesterol diet and exercise.  Check lipid panel.   Diabetes with diabetic chronic kidney disease ([place]) Continue medication: metformin Continue diet and exercise.  Perform daily foot/skin check, notify office of any concerning changes.  Check A1C  CKD 2 associated with T2DM ([name]) Increase fluids, avoid NSAIDS, monitor sugars, will monitor Check CMP/GFR  Obesity with co morbidities- BMI  Will start on phentermine, follow up 1 month Try dietician, check out apps Long discussion about weight loss, diet, and exercise Recommended diet heavy in fruits and veggies and low in animal meats, cheeses, and dairy products, appropriate calorie intake Discussed ideal weight for height  Continue with hello fresh, increasing veggies, add exercise 30 min 3x/week Will follow up in 3 months  Vitamin D Def At goal at last visit; continue supplementation for goal of 60-100 Defer Vit D level  Depression/anxiety  Significantly improved with celexa 20 mg daily and patient feels well controlled Lifestyle discussed: diet/exerise, sleep hygiene, stress management, hydration  Continue diet and meds as discussed. Further disposition pending results of labs. Discussed med's effects and SE's.   Over 30 minutes of exam, counseling, chart review, and critical decision making was performed.   Future Appointments  Date Time Provider [place]  11/12/2019  9:30 AM [name], MD [facility] None  02/06/2020 10:00 AM Vicie Mutters, PA-C [facility]  None  05/22/2020 10:00 AM [name], MD [facility] None    ----------------------------------------------------------------------------------------------------------------------  HPI 69 y.o. male  presents for 3 month follow up on hypertension, cholesterol, diabetes, CKD2, obesity and vitamin D deficiency.   He has hx of prostate CA and s/p radical prostatectomy in 2008, continues to follow with Dr. Karsten Ro for this and renal calculi.   He has depression/anxiety, currently prescribed celexa 20 mg daily  He describes ongoing depressive symptoms related to his wife's health condition but improved with medication.   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 He can cook them.   Wt Readings from Last 3 Encounters:  08/08/19 201 lb (91.2 kg)  05/04/19 202 lb 6.4 oz (91.8 kg)  02/05/19 185 lb (83.9 kg)   His blood pressure has been controlled at home, today their BP is BP: 128/80  He does workout. He denies chest pain, shortness of breath, dizziness.   He is on cholesterol medication (zetia 10 mg daily, hx of statin intolerance) and denies myalgias. His cholesterol is not at goal. The cholesterol last visit was:   Lab Results  Component Value Date   CHOL 144 05/04/2019   HDL 31 (L) 05/04/2019   LDLCALC 84 05/04/2019   TRIG 196 (H) 05/04/2019   CHOLHDL 4.6 05/04/2019    He has been working on diet and exercise for T2DM on metformin, and denies foot ulcerations, hyperglycemia, hypoglycemia , increased appetite, nausea, paresthesia of the feet, polydipsia, polyuria, visual disturbances, vomiting and weight loss. Fasting sugar is a little higher than 120.  Last A1C in the office  was:  Lab Results  Component Value Date   HGBA1C 6.3 (H) 05/04/2019   He has CKD II associated with T2DM monitored at this office:  Lab Results  Component Value Date   GFRAA 79 05/04/2019    Patient is on Vitamin D supplement:    Lab Results  Component Value Date   VD25OH 51 05/04/2019   Patient is on allopurinol for gout and does not report a recent flare, he increased allopurinol to 300 mg BID and endorses improved pain in feet.  Lab Results  Component Value Date   LABURIC 4.8 05/04/2019     Current Medications:  Current Outpatient Medications on File Prior to Visit  Medication Sig  . allopurinol (ZYLOPRIM) 300 MG tablet Take 1 tablet Daily to Prevent Gout  . aspirin 81 MG tablet Take 81 mg by mouth daily.    . bisoprolol-hydrochlorothiazide (ZIAC) 5-6.25 MG tablet Take 1 tablet Daily for BP  . Blood Glucose Monitoring Suppl (FREESTYLE FREEDOM LITE) w/Device KIT Check blood sugar 1 time daily-DX-E11.22  . diclofenac sodium (VOLTAREN) 1 % GEL Apply 4 g topically 4 (four) times daily. (Patient taking differently: Apply 4 g topically 4 (four) times daily as needed (joint pain). )  . ezetimibe (ZETIA) 10 MG tablet Take 1 tablet (10 mg total) by mouth daily.  . fluticasone (FLONASE) 50 MCG/ACT nasal spray Place 1 spray into both nostrils daily as needed for allergies. (Patient taking differently: Place 2 sprays into both nostrils daily as needed for allergies. )  . glucose blood (FREESTYLE LITE) test strip Check blood sugar 1 time daily-E11.22  . HYDROcodone-acetaminophen (NORCO) 10-325 MG tablet Take 1-2 tablets by mouth every 4 (four) hours as needed for moderate pain. Maximum dose per 24 hours - 8 pills  . Lancets (FREESTYLE) lancets Check blood sugar 1 time daily-E11.22  . metFORMIN (GLUCOPHAGE) 500 MG tablet Take  2 tablets 2 x /day with meals for Diabetes (Patient taking differently: Take 1,000 mg by mouth at bedtime. Take  2 tablets 2 x /day with meals for Diabetes)  . Multiple Vitamins-Minerals (MULTIVITAMIN WITH MINERALS) tablet Take 1 tablet by mouth daily.    . Omega-3 Fatty Acids (FISH OIL) 1000 MG CAPS Take 1,000 mg by mouth daily.   Marland Kitchen terbinafine (LAMISIL) 250  MG tablet Take 1 tablet Daily for Toenail Fungus  . valsartan (DIOVAN) 320 MG tablet Take 1 tablet (320 mg total) by mouth daily.  Marland Kitchen VITAMIN D PO Take 5,000 Units by mouth daily.  . Wheat Dextrin (BENEFIBER DRINK MIX PO) Take 1 Package by mouth daily.    No current facility-administered medications on file prior to visit.     Allergies:  Allergies  Allergen Reactions  . Cialis [Tadalafil] Other (See Comments)    Hot flashes  . Fructose Nausea And Vomiting  . Morphine And Related Itching     Medical History:  Past Medical History:  Diagnosis Date  . Asthma    as a child  . Cancer [facility])    prostate  . ED (erectile dysfunction) of organic origin   . Gout   . History of colon polyps   . History of kidney stones   . History of positive PPD    06/ 2006--  CXR NORMAL  . History of prostate cancer    DEC 2008--  S/P  RADICAL PROSTATECTOMY  . Hyperlipidemia   . Hypertension   . Hypogonadism male   . Mixed hyperlipidemia   . Nocturia   . Sigmoid  diverticulosis    MILD  . Type 2 diabetes mellitus ([place])    type 2   Family history- Reviewed and unchanged Social history- Reviewed and unchanged   Review of Systems:  Review of Systems  Constitutional: Negative for malaise/fatigue and weight loss.  HENT: Negative for hearing loss and tinnitus.   Eyes: Negative for blurred vision and double vision.  Respiratory: Negative for cough, shortness of breath and wheezing.   Cardiovascular: Negative for chest pain, palpitations, orthopnea, claudication and leg swelling.  Gastrointestinal: Negative for abdominal pain, blood in stool, constipation, diarrhea, heartburn, melena, nausea and vomiting.  Genitourinary: Negative.   Musculoskeletal: Negative for joint pain and myalgias.  Skin: Negative for rash.  Neurological: Negative for dizziness, tingling, sensory change, weakness and headaches.  Endo/Heme/Allergies: Negative for polydipsia.  Psychiatric/Behavioral: Negative for  depression, memory loss, substance abuse and suicidal ideas. The patient is not nervous/anxious and does not have insomnia.   All other systems reviewed and are negative.    Physical Exam: BP 128/80   Pulse 63   Temp (!) 97.3 F (36.3 C)   Wt 201 lb (91.2 kg)   SpO2 97%   BMI 32.94 kg/m  Wt Readings from Last 3 Encounters:  08/08/19 201 lb (91.2 kg)  05/04/19 202 lb 6.4 oz (91.8 kg)  02/05/19 185 lb (83.9 kg)   General Appearance: Well nourished, in no apparent distress. Eyes: PERRLA, EOMs, conjunctiva no swelling or erythema Sinuses: No Frontal/maxillary tenderness ENT/Mouth: Ext aud canals clear, TMs without erythema, bulging. No erythema, swelling, or exudate on post pharynx.  Tonsils not swollen or erythematous. Hearing normal.  Neck: Supple, thyroid normal.  Respiratory: Respiratory effort normal, BS equal bilaterally without rales, rhonchi, wheezing or stridor.  Cardio: RRR with 1/6 systolic murmur best heard at RSB. Brisk peripheral pulses without edema.  Abdomen: Soft, + BS.  Non tender, no guarding, rebound, hernias, masses. Lymphatics: Non tender without lymphadenopathy.  Musculoskeletal: Full ROM, 5/5 strength, Normal gait. No bony abnormality, effusion/swelling  Skin: Warm, dry without rashes, lesions, ecchymosis.  Neuro: Cranial nerves intact. No cerebellar symptoms.  Psych: Awake and oriented X 3, normal affect, Insight and Judgment appropriate.    Vicie Mutters, PA-C 9:42 AM [facility]

## 2019-08-08 ENCOUNTER — Encounter: Payer: Self-pay | Admitting: Physician Assistant

## 2019-08-08 ENCOUNTER — Ambulatory Visit (INDEPENDENT_AMBULATORY_CARE_PROVIDER_SITE_OTHER): Payer: Medicare Other | Admitting: Physician Assistant

## 2019-08-08 ENCOUNTER — Other Ambulatory Visit: Payer: Self-pay

## 2019-08-08 VITALS — BP 128/80 | HR 63 | Temp 97.3°F | Wt 201.0 lb

## 2019-08-08 DIAGNOSIS — F329 Major depressive disorder, single episode, unspecified: Secondary | ICD-10-CM

## 2019-08-08 DIAGNOSIS — E1169 Type 2 diabetes mellitus with other specified complication: Secondary | ICD-10-CM | POA: Diagnosis not present

## 2019-08-08 DIAGNOSIS — F411 Generalized anxiety disorder: Secondary | ICD-10-CM

## 2019-08-08 DIAGNOSIS — I2510 Atherosclerotic heart disease of native coronary artery without angina pectoris: Secondary | ICD-10-CM

## 2019-08-08 DIAGNOSIS — N182 Chronic kidney disease, stage 2 (mild): Secondary | ICD-10-CM

## 2019-08-08 DIAGNOSIS — M1A9XX0 Chronic gout, unspecified, without tophus (tophi): Secondary | ICD-10-CM

## 2019-08-08 DIAGNOSIS — E669 Obesity, unspecified: Secondary | ICD-10-CM | POA: Diagnosis not present

## 2019-08-08 DIAGNOSIS — I2584 Coronary atherosclerosis due to calcified coronary lesion: Secondary | ICD-10-CM

## 2019-08-08 DIAGNOSIS — I10 Essential (primary) hypertension: Secondary | ICD-10-CM | POA: Diagnosis not present

## 2019-08-08 DIAGNOSIS — Z79899 Other long term (current) drug therapy: Secondary | ICD-10-CM | POA: Diagnosis not present

## 2019-08-08 DIAGNOSIS — E1122 Type 2 diabetes mellitus with diabetic chronic kidney disease: Secondary | ICD-10-CM | POA: Diagnosis not present

## 2019-08-08 DIAGNOSIS — E785 Hyperlipidemia, unspecified: Secondary | ICD-10-CM | POA: Diagnosis not present

## 2019-08-08 DIAGNOSIS — E66811 Obesity, class 1: Secondary | ICD-10-CM

## 2019-08-08 MED ORDER — PHENTERMINE HCL 37.5 MG PO TABS: 37.5000 mg | ORAL_TABLET | Freq: Every day | ORAL | 2 refills | Status: DC

## 2019-08-08 NOTE — Patient Instructions (Addendum)
Check out [email or web address]  You can find a registered dietitian near you. At the bottom of the website you can search for a dietitian and there is a filter option by speciality as well.  A dietitian has their master's and are an expert at nutrition.  FIBER SUPPLEMENT  Benefiber or Citracel is good for constipation/diarrhea/irritable bowel syndrome, it helps with weight loss and can help lower your bad cholesterol. Please do 1 TBSP in the morning in water, coffee, or tea. It can take up to a month before you can see a difference with your bowel movements. It is cheapest from [employer], [employer], [employer].    Check out  Mini habits for weight loss book  2 free apps for tracking food is myfitness pal  loseit  If you want more structured weight loss that you have to pay for, you can look into  [facility]  weight watchers  General eating tips  What to Avoid . Avoid added sugars o Often added sugar can be found in processed foods such as many condiments, dry cereals, cakes, cookies, chips, crisps, crackers, candies, sweetened drinks, etc.  o Read labels and AVOID/DECREASE use of foods with the following in their ingredient list: Sugar, fructose, high fructose corn syrup, sucrose, glucose, maltose, dextrose, molasses, cane sugar, brown sugar, any type of syrup, agave nectar, etc.   . Avoid snacking in between meals- drink water or if you feel you need a snack, pick a high water content snack such as cucumbers, watermelon, or any veggie.  Marland Kitchen Avoid foods made with flour o If you are going to eat food made with flour, choose those made with whole-grains; and, minimize your consumption as much as is tolerable . Avoid processed foods o These foods are generally stocked in the middle of the grocery store.  o Focus on shopping on the perimeter of the grocery.  What to Include . Vegetables o GREEN LEAFY VEGETABLES: Kale, spinach, mustard greens, collard greens, cabbage, broccoli, etc. o OTHER: Asparagus,  cauliflower, eggplant, carrots, peas, Brussel sprouts, tomatoes, bell peppers, zucchini, beets, cucumbers, etc. . Grains, seeds, and legumes o Beans: kidney beans, black eyed peas, garbanzo beans, black beans, pinto beans, etc. o Whole, unrefined grains: brown rice, barley, bulgur, oatmeal, etc. . Healthy fats  o Avoid highly processed fats such as vegetable oil o Examples of healthy fats: avocado, olives, virgin olive oil, dark chocolate (?72% Cocoa), nuts (peanuts, almonds, walnuts, cashews, pecans, etc.) o Please still do small amount of these healthy fats, they are dense in calories.  . Low - Moderate Intake of Animal Sources of Protein o Meat sources: chicken, [place], salmon, tuna. Limit to 4 ounces of meat at one time or the size of your palm. o Consider limiting dairy sources, but when choosing dairy focus on: PLAIN [place] yogurt, cottage cheese, high-protein milk . Fruit o Choose berries   Phentermine  While taking the medication we may ask that you come into the office once a month. The first month we will get an EKG on you.   PLEASE BRING A FOOD LONG TO THE FIRST VISIT.   It is helpful if you bring in a food diary or use an app on your phone such as myfitnesspal to record your calorie intake, especially in the beginning.  After that first initial visit, we will want to see you once a month for accountability.  In addition we can help answer your questions about diet, exercise, and help you every step of the way with  your weight loss journey.  HOW TO START THE MEDICATION You can start out on 1/2 a pill in the morning  FOR THE FIRST MONTH.  WE WILL THEN INCREASE TO 1 PILL AFTER THAT 1ST VISIT.   COST OF THE MEDICATION This medication is cheapest CASH pay at [facility] is 14-17 dollars and you do NOT need a membership to get meds from there.   SIDE EFFECTS It causes dry mouth and constipation in almost every patient, so try to get 80-100 oz of water a day  and increase fiber such as veggies. You can add on a stool softener if you would like.   It can give you energy however it can also cause some people to be shaky, anxious or have palpitations. Stop this medication if that happens and contact the office.   If this medication does not work for you there are several medications that we can try to help rewire your brain in addition to making healthier habits.   What is this medicine? PHENTERMINE (FEN ter meen) decreases your appetite. This medicine is intended to be used in addition to a healthy reduced calorie diet and exercise. The best results are achieved this way. This medicine is only indicated for short-term use. Eventually your weight loss may level out and the medication will no longer be needed.   How should I use this medicine? Take this medicine by mouth. Follow the directions on the prescription label. The tablets should stay in the bottle until immediately before you take your dose. Take your doses at regular intervals. Do not take your medicine more often than directed.  Overdosage: If you think you have taken too much of this medicine contact a poison control center or emergency room at once. NOTE: This medicine is only for you. Do not share this medicine with others.  What if I miss a dose? If you miss a dose, take it as soon as you can. If it is almost time for your next dose, take only that dose. Do not take double or extra doses. Do not increase or in any way change your dose without consulting your doctor.  What should I watch for while using this medicine? Notify your physician immediately if you become short of breath while doing your normal activities. Do not take this medicine within 6 hours of bedtime. It can keep you from getting to sleep. Avoid drinks that contain caffeine and try to stick to a regular bedtime every night. Do not stand or sit up quickly, especially if you are an older patient. This reduces the risk of  dizzy or fainting spells. Avoid alcoholic drinks.  What side effects may I notice from receiving this medicine? Side effects that you should report to your doctor or health care professional as soon as possible: -chest pain, palpitations -depression or severe changes in mood -increased blood pressure -irritability -nervousness or restlessness -severe dizziness -shortness of breath -problems urinating -unusual swelling of the legs -vomiting  Side effects that usually do not require medical attention (report to your doctor or health care professional if they continue or are bothersome): -blurred vision or other eye problems -changes in sexual ability or desire -constipation or diarrhea -difficulty sleeping -dry mouth or unpleasant taste -headache -nausea This list may not describe all possible side effects. Call your doctor for medical advice about side effects. You may report side effects to FDA at 1-800-FDA-1088.

## 2019-08-09 LAB — CBC WITH DIFFERENTIAL/PLATELET
Absolute Monocytes: 490 cells/uL (ref 200–950)
Basophils Absolute: 27 cells/uL (ref 0–200)
Basophils Relative: 0.4 %
Eosinophils Absolute: 211 cells/uL (ref 15–500)
Eosinophils Relative: 3.1 %
HCT: 43.9 % (ref 38.5–50.0)
Hemoglobin: 14.8 g/dL (ref 13.2–17.1)
Lymphs Abs: 2795 cells/uL (ref 850–3900)
MCH: 30.8 pg (ref 27.0–33.0)
MCHC: 33.7 g/dL (ref 32.0–36.0)
MCV: 91.3 fL (ref 80.0–100.0)
MPV: 9.7 fL (ref 7.5–12.5)
Monocytes Relative: 7.2 %
Neutro Abs: 3278 cells/uL (ref 1500–7800)
Neutrophils Relative %: 48.2 %
Platelets: 249 10*3/uL (ref 140–400)
RBC: 4.81 10*6/uL (ref 4.20–5.80)
RDW: 14.1 % (ref 11.0–15.0)
Total Lymphocyte: 41.1 %
WBC: 6.8 10*3/uL (ref 3.8–10.8)

## 2019-08-09 LAB — COMPLETE METABOLIC PANEL WITH GFR
AG Ratio: 1.4 (calc) (ref 1.0–2.5)
ALT: 23 U/L (ref 9–46)
AST: 26 U/L (ref 10–35)
Albumin: 4.2 g/dL (ref 3.6–5.1)
Alkaline phosphatase (APISO): 70 U/L (ref 35–144)
BUN: 19 mg/dL (ref 7–25)
CO2: 27 mmol/L (ref 20–32)
Calcium: 9.6 mg/dL (ref 8.6–10.3)
Chloride: 105 mmol/L (ref 98–110)
Creat: 1.13 mg/dL (ref 0.70–1.18)
GFR, Est African American: 76 mL/min/{1.73_m2} (ref >=60)
GFR, Est Non African American: 65 mL/min/{1.73_m2} (ref >=60)
Globulin: 3 g/dL (calc) (ref 1.9–3.7)
Glucose, Bld: 108 mg/dL — ABNORMAL HIGH (ref 65–99)
Potassium: 4.3 mmol/L (ref 3.5–5.3)
Sodium: 138 mmol/L (ref 135–146)
Total Bilirubin: 0.4 mg/dL (ref 0.2–1.2)
Total Protein: 7.2 g/dL (ref 6.1–8.1)

## 2019-08-09 LAB — TSH: TSH: 1.54 mIU/L (ref 0.40–4.50)

## 2019-08-09 LAB — MAGNESIUM: Magnesium: 2 mg/dL (ref 1.5–2.5)

## 2019-08-09 LAB — LIPID PANEL
Cholesterol: 134 mg/dL (ref <200)
HDL: 29 mg/dL — ABNORMAL LOW (ref >=40)
LDL Cholesterol (Calc): 80 mg/dL (calc)
Non-HDL Cholesterol (Calc): 105 mg/dL (calc) (ref <130)
Total CHOL/HDL Ratio: 4.6 (calc) (ref <5.0)
Triglycerides: 153 mg/dL — ABNORMAL HIGH (ref <150)

## 2019-08-09 LAB — HEMOGLOBIN A1C
Hgb A1c MFr Bld: 6.6 % of total Hgb — ABNORMAL HIGH (ref <5.7)
Mean Plasma Glucose: 143 (calc)
eAG (mmol/L): 7.9 (calc)

## 2019-09-03 NOTE — Progress Notes (Signed)
69 y.o.male presents for a follow up after being on phentermine for weight loss.   While on the medication they have lost 6 lbs since last visit which was 4 weeks ago.  They deny palpitations, anxiety, trouble sleeping, elevated BP.  He does not sleep well, sleeps 5 hours mainly, goes to sleep well, will wake up at 4AM and will nap a few times during the day. Rare snoring, will wake up to go to the BR occ, he had a normal sleep study.   BP Readings from Last 3 Encounters:  09/05/19 132/80  08/08/19 128/80  05/04/19 134/84   BMI is Body mass index is 32.45 kg/m., he is working on diet and exercise. Wt Readings from Last 3 Encounters:  09/05/19 195 lb (88.5 kg)  08/08/19 201 lb (91.2 kg)  05/04/19 202 lb 6.4 oz (91.8 kg)   Lab Results  Component Value Date   HGBA1C 6.6 (H) 08/08/2019   Medications:  Current Outpatient Medications (Endocrine & Metabolic):  .  metFORMIN (GLUCOPHAGE) 500 MG tablet, Take  2 tablets 2 x /day with meals for Diabetes (Patient taking differently: Take 1,000 mg by mouth at bedtime. Take  2 tablets 2 x /day with meals for Diabetes)  Current Outpatient Medications (Cardiovascular):  .  bisoprolol-hydrochlorothiazide (ZIAC) 5-6.25 MG tablet, Take 1 tablet Daily for BP .  ezetimibe (ZETIA) 10 MG tablet, Take 1 tablet (10 mg total) by mouth daily. .  valsartan (DIOVAN) 320 MG tablet, Take 1 tablet (320 mg total) by mouth daily.  Current Outpatient Medications (Respiratory):  .  fluticasone (FLONASE) 50 MCG/ACT nasal spray, Place 1 spray into both nostrils daily as needed for allergies. (Patient taking differently: Place 2 sprays into both nostrils daily as needed for allergies. )  Current Outpatient Medications (Analgesics):  .  allopurinol (ZYLOPRIM) 300 MG tablet, Take 1 tablet Daily to Prevent Gout .  aspirin 81 MG tablet, Take 81 mg by mouth daily.   Marland Kitchen  HYDROcodone-acetaminophen (NORCO) 10-325 MG tablet, Take 1-2 tablets by mouth every 4 (four) hours as  needed for moderate pain. Maximum dose per 24 hours - 8 pills   Current Outpatient Medications (Other):  .  Blood Glucose Monitoring Suppl (FREESTYLE FREEDOM LITE) w/Device KIT, Check blood sugar 1 time daily-[id number] .  diclofenac sodium (VOLTAREN) 1 % GEL, Apply 4 g topically 4 (four) times daily. (Patient taking differently: Apply 4 g topically 4 (four) times daily as needed (joint pain). ) .  glucose blood (FREESTYLE LITE) test strip, Check blood sugar 1 time daily-E11.22 .  Lancets (FREESTYLE) lancets, Check blood sugar 1 time daily-E11.22 .  Multiple Vitamins-Minerals (MULTIVITAMIN WITH MINERALS) tablet, Take 1 tablet by mouth daily.   .  Omega-3 Fatty Acids (FISH OIL) 1000 MG CAPS, Take 1,000 mg by mouth daily.  .  phentermine (ADIPEX-P) 37.5 MG tablet, Take 1 tablet (37.5 mg total) by mouth daily before breakfast. .  terbinafine (LAMISIL) 250 MG tablet, Take 1 tablet Daily for Toenail Fungus .  VITAMIN D PO, Take 5,000 Units by mouth daily. .  Wheat Dextrin (BENEFIBER DRINK MIX PO), Take 1 Package by mouth daily.   ROS: All negative except for above  Physical exam: Vitals:   09/05/19 0947  BP: 132/80  Pulse: 72  Temp: (!) 97.5 F (36.4 C)  SpO2: 96%  Waist is 41 inches  Physical Exam Constitutional:      Appearance: He is well-developed.  HENT:     Head: Normocephalic and atraumatic.  Right Ear: External ear normal.     Left Ear: External ear normal.  Eyes:     Conjunctiva/sclera: Conjunctivae normal.     Pupils: Pupils are equal, round, and reactive to light.  Cardiovascular:     Rate and Rhythm: Normal rate and regular rhythm.     Heart sounds: Normal heart sounds.  Pulmonary:     Effort: Pulmonary effort is normal.     Breath sounds: Normal breath sounds.  Abdominal:     General: Bowel sounds are normal.     Palpations: Abdomen is soft.  Musculoskeletal:        General: Normal range of motion.     Cervical back: Normal range of motion and neck supple.   Skin:    General: Skin is warm and dry.     Comments: Bilateral big toe nails with thickening, brittle and yellow, no erythema.    Neurological:     Mental Status: He is alert and oriented to person, place, and time.     Cranial Nerves: No cranial nerve deficit.  Psychiatric:        Behavior: Behavior normal.    Assessment: Obesity with co morbid conditions.  Left 1,2, 4 toe with fungus  Plan: He will work on meal planning, intentional eating, and increasing water.  He has been instructed to work up to a goal of 150 minutes of combined cardio and strengthening exercise per week for weight loss and overall health benefits. We discussed the following Behavioral Modification Strategies today: increasing lean protein intake, decreasing simple carbohydrates, increasing vegetables, increase H20 intake, decrease eating out, no skipping meals, work on meal planning and easy cooking plans, keeping healthy foods in the home, and planning for success.   He has agreed to follow-up with our clinic in 8 weeks. He was informed of the importance of frequent follow-up visits to maximize his success with intensive lifestyle modifications for his multiple health conditions. Does not want to do topamax at this time.   Will refer to podiatry, has been on lamisil without help.   Medication refill: phentermine   Future Appointments  Date Time Provider [place]  11/12/2019  9:30 AM [name], MD [facility] None  02/06/2020 10:00 AM Vicie Mutters, PA-C [facility] None  05/22/2020 10:00 AM [name], MD [facility] None

## 2019-09-05 ENCOUNTER — Encounter: Payer: Self-pay | Admitting: Physician Assistant

## 2019-09-05 ENCOUNTER — Ambulatory Visit (INDEPENDENT_AMBULATORY_CARE_PROVIDER_SITE_OTHER): Payer: Medicare Other | Admitting: Physician Assistant

## 2019-09-05 ENCOUNTER — Other Ambulatory Visit: Payer: Self-pay

## 2019-09-05 VITALS — BP 132/80 | HR 72 | Temp 97.5°F | Ht 65.0 in | Wt 195.0 lb

## 2019-09-05 DIAGNOSIS — E669 Obesity, unspecified: Secondary | ICD-10-CM | POA: Diagnosis not present

## 2019-09-05 DIAGNOSIS — B351 Tinea unguium: Secondary | ICD-10-CM

## 2019-09-05 DIAGNOSIS — I2510 Atherosclerotic heart disease of native coronary artery without angina pectoris: Secondary | ICD-10-CM

## 2019-09-05 DIAGNOSIS — N182 Chronic kidney disease, stage 2 (mild): Secondary | ICD-10-CM | POA: Diagnosis not present

## 2019-09-05 DIAGNOSIS — E785 Hyperlipidemia, unspecified: Secondary | ICD-10-CM

## 2019-09-05 DIAGNOSIS — I2584 Coronary atherosclerosis due to calcified coronary lesion: Secondary | ICD-10-CM | POA: Diagnosis not present

## 2019-09-05 DIAGNOSIS — E1122 Type 2 diabetes mellitus with diabetic chronic kidney disease: Secondary | ICD-10-CM | POA: Diagnosis not present

## 2019-09-05 DIAGNOSIS — E1169 Type 2 diabetes mellitus with other specified complication: Secondary | ICD-10-CM | POA: Diagnosis not present

## 2019-09-05 MED ORDER — PHENTERMINE HCL 37.5 MG PO TABS: 37.5000 mg | ORAL_TABLET | Freq: Every day | ORAL | 2 refills | Status: DC

## 2019-09-05 NOTE — Patient Instructions (Signed)
General eating tips  What to Avoid . Avoid added sugars o Often added sugar can be found in processed foods such as many condiments, dry cereals, cakes, cookies, chips, crisps, crackers, candies, sweetened drinks, etc.  o Read labels and AVOID/DECREASE use of foods with the following in their ingredient list: Sugar, fructose, high fructose corn syrup, sucrose, glucose, maltose, dextrose, molasses, cane sugar, brown sugar, any type of syrup, agave nectar, etc.   . Avoid snacking in between meals- drink water or if you feel you need a snack, pick a high water content snack such as cucumbers, watermelon, or any veggie.  Marland Kitchen Avoid foods made with flour o If you are going to eat food made with flour, choose those made with whole-grains; and, minimize your consumption as much as is tolerable . Avoid processed foods o These foods are generally stocked in the middle of the grocery store.  o Focus on shopping on the perimeter of the grocery.  What to Include . Vegetables o GREEN LEAFY VEGETABLES: Kale, spinach, mustard greens, collard greens, cabbage, broccoli, etc. o OTHER: Asparagus, cauliflower, eggplant, carrots, peas, Brussel sprouts, tomatoes, bell peppers, zucchini, beets, cucumbers, etc. . Grains, seeds, and legumes o Beans: kidney beans, black eyed peas, garbanzo beans, black beans, pinto beans, etc. o Whole, unrefined grains: brown rice, barley, bulgur, oatmeal, etc. . Healthy fats  o Avoid highly processed fats such as vegetable oil o Examples of healthy fats: avocado, olives, virgin olive oil, dark chocolate (?72% Cocoa), nuts (peanuts, almonds, walnuts, cashews, pecans, etc.) o Please still do small amount of these healthy fats, they are dense in calories.  . Low - Moderate Intake of Animal Sources of Protein o Meat sources: chicken, [place], salmon, tuna. Limit to 4 ounces of meat at one time or the size of your palm. o Consider limiting dairy sources, but when choosing dairy focus on:  PLAIN [place] yogurt, cottage cheese, high-protein milk . Fruit o Choose berries    If I told you I had a single pill that would help you with everything listed below and more, would you be interested? . decrease stress by improving anxiety and depression . help you achieve a healthy weight . give you more energy . make you more productive . help you focus . decrease your risk of dementia/heart attack/stroke/falls . improve your bone health  These are just some of the benefits that exercise brings to you.   IT IS WORTH carving out some time every day to fit in exercise. It will help in every aspect of your health. Even if you have injuries that prevent you from participating in a type of exercise you used to do; there is always something that you can do to keep exercise a part of your life. If improving your health is important, make exercise your priority. It is worth the time! If you have questions about the type of exercise that is right for you, please talk with me about this!  EXERCISE IS MEDICINE!  Benefits of Exercise  Reduces breast cancer onset and recurrence by 50% Lowers risk of colon cancer by 66% Reduces the risk of Alzheimer's by almost 50% Reduces heart disease and high blood pressure by almost 50% Lowers risk of stroke by 33% Lowers risk of Type II Diabetes Mellitus by over 60% Treats depression as well as medication or cognitive behavioral therapy       Exercising to Stay Healthy  Exercising regularly is important. It has many health benefits, such as:  Improving your overall fitness, flexibility, and endurance.  Increasing your bone density.  Helping with weight control.  Decreasing your body fat.  Increasing your muscle strength.  Reducing stress and tension.  Improving your overall health.   In order to become healthy and stay healthy, it is recommended that you do moderate-intensity and vigorous-intensity exercise. You can tell that you are  exercising at a moderate intensity if you have a higher heart rate and faster breathing, but you are still able to hold a conversation. You can tell that you are exercising at a vigorous intensity if you are breathing much harder and faster and cannot hold a conversation while exercising. How often should I exercise? Choose an activity that you enjoy and set realistic goals. Your health care provider can help you to make an activity plan that works for you. Exercise regularly as directed by your health care provider. This may include:  Doing resistance training twice each week, such as: ? Push-ups. ? Sit-ups. ? Lifting weights. ? Using resistance bands.  Doing a given intensity of exercise for a given amount of time. Choose from these options: ? 150 minutes of moderate-intensity exercise every week. ? 75 minutes of vigorous-intensity exercise every week. ? A mix of moderate-intensity and vigorous-intensity exercise every week.   Children, pregnant women, people who are out of shape, people who are overweight, and older adults may need to consult a health care provider for individual recommendations. If you have any sort of medical condition, be sure to consult your health care provider before starting a new exercise program. What are some exercise ideas? Some moderate-intensity exercise ideas include:  Walking at a rate of 1 mile in 15 minutes.  Biking.  Hiking.  Golfing.  Dancing.   Some vigorous-intensity exercise ideas include:  Walking at a rate of at least 4.5 miles per hour.  Jogging or running at a rate of 5 miles per hour.  Biking at a rate of at least 10 miles per hour.  Lap swimming.  Roller-skating or in-line skating.  Cross-country skiing.  Vigorous competitive sports, such as football, basketball, and soccer.  Jumping rope.  Aerobic dancing.   What are some everyday activities that can help me to get exercise?  [place] work, such as: ? Pushing a Cabin crew. ? Raking and bagging leaves.  Washing and waxing your car.  Pushing a stroller.  Shoveling snow.  Gardening.  Washing windows or floors. How can I be more active in my day-to-day activities?  Use the stairs instead of the elevator.  Take a walk during your lunch break.  If you drive, park your car farther away from work or school.  If you take public transportation, get off one stop early and walk the rest of the way.  Make all of your phone calls while standing up and walking around.  Get up, stretch, and walk around every 30 minutes throughout the day. What guidelines should I follow while exercising?  Do not exercise so much that you hurt yourself, feel dizzy, or get very short of breath.  Consult your health care provider before starting a new exercise program.  Wear comfortable clothes and shoes with good support.  Drink plenty of water while you exercise to prevent dehydration or heat stroke. Body water is lost during exercise and must be replaced.  Work out until you breathe faster and your heart beats faster. This information is not intended to replace advice given to you by your health care  provider. Make sure you discuss any questions you have with your health care provider.

## 2019-10-04 ENCOUNTER — Ambulatory Visit (INDEPENDENT_AMBULATORY_CARE_PROVIDER_SITE_OTHER): Payer: Medicare Other | Admitting: Podiatry

## 2019-10-04 ENCOUNTER — Encounter: Payer: Self-pay | Admitting: Podiatry

## 2019-10-04 ENCOUNTER — Other Ambulatory Visit: Payer: Self-pay

## 2019-10-04 DIAGNOSIS — L603 Nail dystrophy: Secondary | ICD-10-CM

## 2019-10-04 DIAGNOSIS — I2584 Coronary atherosclerosis due to calcified coronary lesion: Secondary | ICD-10-CM

## 2019-10-04 DIAGNOSIS — L600 Ingrowing nail: Secondary | ICD-10-CM

## 2019-10-04 DIAGNOSIS — I2510 Atherosclerotic heart disease of native coronary artery without angina pectoris: Secondary | ICD-10-CM

## 2019-10-04 MED ORDER — NEOMYCIN-POLYMYXIN-HC 1 % OT SOLN: OTIC | 1 refills | Status: DC

## 2019-10-04 NOTE — Patient Instructions (Signed)

## 2019-10-04 NOTE — Progress Notes (Signed)
Subjective:  Patient ID: Cody Zamora, male    DOB: April 23, 1950,  MRN: [id number] HPI Chief Complaint  Patient presents with  . Toe Pain    Hallux left - medial border, ingrown x few months  . Nail Problem    2nd toenail left - injury x few months ago, toenail loose at the tip  . Nail Problem    Concerned about nail fungus in all the nails, has taken almost 3 months of lamisil  . New Patient (Initial Visit)    69 y.o. male presents with the above complaint.   ROS: Denies fever chills nausea vomiting muscle aches pains calf pain back pain chest pain shortness of breath.  Past Medical History:  Diagnosis Date  . Asthma    as a child  . Cancer [facility])    prostate  . ED (erectile dysfunction) of organic origin   . Gout   . History of colon polyps   . History of kidney stones   . History of positive PPD    06/ 2006--  CXR NORMAL  . History of prostate cancer    DEC 2008--  S/P  RADICAL PROSTATECTOMY  . Hyperlipidemia   . Hypertension   . Hypogonadism male   . Mixed hyperlipidemia   . Nocturia   . Sigmoid diverticulosis    MILD  . Type 2 diabetes mellitus ([place])    type 2   Past Surgical History:  Procedure Laterality Date  . ACHILLES TENDON REPAIR Right 1989  . COLONOSCOPY N/A last one 10-28-2010    due 10 yr f/u in 2022 - [facility]  . FINGER AMPUTATION  age 76   left Middle  . HOLMIUM LASER APPLICATION Right 89/16/9450   Procedure: HOLMIUM LASER APPLICATION;  Surgeon: Kathie Rhodes, MD;  Location: [facility];  Service: Urology;  Laterality: Right;  . IR URETERAL STENT RIGHT NEW ACCESS W/O SEP NEPHROSTOMY CATH  02/05/2019  . NEGATIVE SLEEP STUDY  2010  per pt  . NEPHROLITHOTOMY Right 02/05/2019   Procedure: NEPHROLITHOTOMY PERCUTANEOUS, STENT PLACEMENT;  Surgeon: Kathie Rhodes, MD;  [facility];  Service: Urology;  Laterality: Right;  . PENILE PROSTHESIS IMPLANT N/A 10/28/2014   Procedure: IMPLANTATION 3 PIECE PENILE INFLATABLE PROTHESIS/COLOPLAST SCROTAL APPROACH;   Surgeon: Kathie Rhodes, MD;  Location: [place];  Service: Urology;  Laterality: N/A;  . PERCUTANEOUS NEPHROLITHOTRIPSY Left 04-04-2009// second look 04-15-2009  . ROBOT ASSISTED LAPAROSCOPIC RADICAL PROSTATECTOMY  Dec 2008  . SHOULDER OPEN ROTATOR CUFF REPAIR Right 05-11-2006  . URETEROLITHOTOMY  2002    Current Outpatient Medications:  .  allopurinol (ZYLOPRIM) 300 MG tablet, Take 1 tablet Daily to Prevent Gout, Disp: 90 tablet, Rfl: 3 .  aspirin 81 MG tablet, Take 81 mg by mouth daily.  , Disp: , Rfl:  .  bisoprolol-hydrochlorothiazide (ZIAC) 5-6.25 MG tablet, Take 1 tablet Daily for BP, Disp: 90 tablet, Rfl: 3 .  Blood Glucose Monitoring Suppl (FREESTYLE FREEDOM LITE) w/Device KIT, Check blood sugar 1 time daily-DX-E11.22, Disp: 1 kit, Rfl: 0 .  diclofenac Sodium (VOLTAREN) 1 % GEL, Apply 1 application topically 4 (four) times daily., Disp: , Rfl:  .  ezetimibe (ZETIA) 10 MG tablet, Take 1 tablet (10 mg total) by mouth daily., Disp: 90 tablet, Rfl: 3 .  fluticasone (FLONASE) 50 MCG/ACT nasal spray, Place 1 spray into both nostrils daily as needed for allergies. (Patient taking differently: Place 2 sprays into both nostrils daily as needed for allergies. ), Disp: 20 g, Rfl: 1 .  glucose blood (FREESTYLE LITE) test strip, Check blood sugar 1 time daily-E11.22, Disp: 100 each, Rfl: 3 .  HYDROcodone-acetaminophen (NORCO) 10-325 MG tablet, Take 1-2 tablets by mouth every 4 (four) hours as needed for moderate pain. Maximum dose per 24 hours - 8 pills, Disp: 20 tablet, Rfl: 0 .  Lancets (FREESTYLE) lancets, Check blood sugar 1 time daily-E11.22, Disp: 100 each, Rfl: 3 .  metFORMIN (GLUCOPHAGE) 500 MG tablet, Take  2 tablets 2 x /day with meals for Diabetes (Patient taking differently: Take 1,000 mg by mouth at bedtime. Take  2 tablets 2 x /day with meals for Diabetes), Disp: 360 tablet, Rfl: 3 .  Multiple Vitamins-Minerals (MULTIVITAMIN WITH MINERALS) tablet, Take 1 tablet by mouth  daily.  , Disp: , Rfl:  .  NEOMYCIN-POLYMYXIN-HYDROCORTISONE (CORTISPORIN) 1 % SOLN OTIC solution, Apply 1-2 drops to toe BID after soaking, Disp: 10 mL, Rfl: 1 .  Omega-3 Fatty Acids (FISH OIL) 1000 MG CAPS, Take 1,000 mg by mouth daily. , Disp: , Rfl:  .  phentermine (ADIPEX-P) 37.5 MG tablet, Take 1 tablet (37.5 mg total) by mouth daily before breakfast., Disp: 30 tablet, Rfl: 2 .  terbinafine (LAMISIL) 250 MG tablet, Take 1 tablet Daily for Toenail Fungus, Disp: 90 tablet, Rfl: 1 .  valsartan (DIOVAN) 320 MG tablet, Take 1 tablet (320 mg total) by mouth daily., Disp: 90 tablet, Rfl: 3 .  VITAMIN D PO, Take 5,000 Units by mouth daily., Disp: , Rfl:  .  Wheat Dextrin (BENEFIBER DRINK MIX PO), Take 1 Package by mouth daily. , Disp: , Rfl:   Allergies  Allergen Reactions  . Cialis [Tadalafil] Other (See Comments)    Hot flashes  . Fructose Nausea And Vomiting  . Morphine And Related Itching   Review of Systems Objective:  There were no vitals filed for this visit.  General: Well developed, nourished, in no acute distress, alert and oriented x3   Dermatological: Skin is warm, dry and supple bilateral. Nails x 10 are well maintained however he does demonstrate some probable onychomycosis that is being treated with Lamisil currently.  He states that it appears to be healing.; remaining integument appears unremarkable at this time. There are no open sores, no preulcerative lesions, no rash or signs of infection present.  Sharp incurvated nail margin tibial border hallux left exquisitely tender on palpation.  No erythema cellulitis drainage or odor.  Vascular: Dorsalis Pedis artery and Posterior Tibial artery pedal pulses are 2/4 bilateral with immedate capillary fill time. Pedal hair growth present. No varicosities and no lower extremity edema present bilateral.   Neruologic: Grossly intact via light touch bilateral. Vibratory intact via tuning fork bilateral. Protective threshold with Semmes  Wienstein monofilament intact to all pedal sites bilateral. Patellar and Achilles deep tendon reflexes 2+ bilateral. No Babinski or clonus noted bilateral.   Musculoskeletal: No gross boney pedal deformities bilateral. No pain, crepitus, or limitation noted with foot and ankle range of motion bilateral. Muscular strength 5/5 in all groups tested bilateral.  Gait: Unassisted, Nonantalgic.    Radiographs:  None taken  Assessment & Plan:   Assessment: Ingrown toenail diabetes mellitus noncomplicated hallux left, onychomycosis being treated with Lamisil.  Plan: Discussed etiology pathology conservative surgical therapies at this point consented for a chemical matrixectomy partial tibial border hallux left.  He tolerated procedure well without complications.  He was provided both oral and written home-going instruction for the care and soaking of the toe.  Also provided him a prescription for Corticosporin otic  to be applied twice daily after soaking.  I will follow-up with him in 2 weeks should he have questions or concerns she will notify us immediately.     Augustina Braddock T. [place], [place]

## 2019-10-18 ENCOUNTER — Other Ambulatory Visit: Payer: Self-pay

## 2019-10-18 ENCOUNTER — Ambulatory Visit (INDEPENDENT_AMBULATORY_CARE_PROVIDER_SITE_OTHER): Payer: Medicare Other | Admitting: Podiatry

## 2019-10-18 ENCOUNTER — Encounter: Payer: Self-pay | Admitting: Podiatry

## 2019-10-18 DIAGNOSIS — L600 Ingrowing nail: Secondary | ICD-10-CM

## 2019-10-18 DIAGNOSIS — Z9889 Other specified postprocedural states: Secondary | ICD-10-CM

## 2019-10-18 NOTE — Progress Notes (Signed)
Cody Zamora presents today for follow-up of his nail procedure hallux left.  He states that is healing just fine he states that he feels wonderful and his toe is so much better.  Objective: Vital signs are stable alert and oriented x3.  Pulses are palpable there is no erythema edema cellulitis drainage or odor nail margin appears to be healing naturally.  No signs of infection.  Assessment: Well-healing surgical toe hallux left tibial border.  Plan: Continue to soak on an every other day basis and apply a small amount of Neosporin until completely healed in.  I expressed to him that if he become concerned or this become red and painful he is to notify us immediately.

## 2019-11-09 ENCOUNTER — Other Ambulatory Visit: Payer: Self-pay | Admitting: Adult Health

## 2019-11-11 ENCOUNTER — Encounter: Payer: Self-pay | Admitting: Internal Medicine

## 2019-11-11 NOTE — Progress Notes (Signed)
History of Present Illness:       This very nice 69 y.o.  MBM  presents for 6 month follow up with HTN, HLD, T2_NIDDM and Vitamin D Deficiency. Patient's Gout is controlled on Allopurinol. Patient has hx/o  Robotic Prostatectomy in 2008 and penile pump in 2016.      Patient is treated for HTN (1996) & BP has been controlled at home. Today's BP is at goal -  116/74. Patient has had no complaints of any cardiac type chest pain, palpitations, dyspnea / orthopnea / PND, dizziness, claudication, or dependent edema.      Hyperlipidemia is controlled with diet & meds. Patient denies myalgias or other med SE's. Last Lipids were at goal:  Lab Results  Component Value Date   CHOL 134 08/08/2019   HDL 29 (L) 08/08/2019   LDLCALC 80 08/08/2019   TRIG 153 (H) 08/08/2019   CHOLHDL 4.6 08/08/2019    Also, the patient has history of T2_NIDDM/CKD2 (2011)  and has had no symptoms of reactive hypoglycemia, diabetic polys, paresthesias or visual blurring.  Last A1c was not at goal:  Lab Results  Component Value Date   HGBA1C 6.6 (H) 08/08/2019           Further, the patient also has history of Vitamin D Deficiency ("11" / 2008)  and supplements vitamin D without any suspected side-effects. Last vitamin D was still slightly low  (goal 70-100):  Lab Results  Component Value Date   VD25OH 51 05/04/2019    Current Outpatient Medications on File Prior to Visit  Medication Sig  . allopurinol (ZYLOPRIM) 300 MG tablet Take 1 tablet Daily to Prevent Gout  . aspirin 81 MG tablet Take 81 mg by mouth daily.    . bisoprolol-hydrochlorothiazide (ZIAC) 5-6.25 MG tablet Take 1 tablet Daily for BP  . Blood Glucose Monitoring Suppl (FREESTYLE FREEDOM LITE) w/Device KIT Check blood sugar 1 time daily-DX-E11.22  . diclofenac Sodium (VOLTAREN) 1 % GEL Apply 1 application topically 4 (four) times daily.  Marland Kitchen ezetimibe (ZETIA) 10 MG tablet Take 1 tablet (10 mg total) by mouth daily.  . fluticasone (FLONASE) 50  MCG/ACT nasal spray Place 1 spray into both nostrils daily as needed for allergies. (Patient taking differently: Place 2 sprays into both nostrils daily as needed for allergies. )  . glucose blood (FREESTYLE LITE) test strip Check blood sugar 1 time daily-E11.22  . Lancets (FREESTYLE) lancets Check blood sugar 1 time daily-E11.22  . metFORMIN (GLUCOPHAGE) 500 MG tablet Take  2 tablets 2 x /day with meals for Diabetes (Patient taking differently: Take 1,000 mg by mouth at bedtime. Take  2 tablets 2 x /day with meals for Diabetes)  . Multiple Vitamins-Minerals (MULTIVITAMIN WITH MINERALS) tablet Take 1 tablet by mouth daily.    . Omega-3 Fatty Acids (FISH OIL) 1000 MG CAPS Take 1,000 mg by mouth daily.   Marland Kitchen terbinafine (LAMISIL) 250 MG tablet Take 1 tablet Daily for Toenail Fungus  . valsartan (DIOVAN) 320 MG tablet Take 1 tablet Daily for BP  . VITAMIN D PO Take 5,000 Units by mouth daily.  . Wheat Dextrin (BENEFIBER DRINK MIX PO) Take 1 Package by mouth daily.   . phentermine (ADIPEX-P) 37.5 MG tablet Take 1 tablet (37.5 mg total) by mouth daily before breakfast. (Patient not taking: Reported on 11/12/2019)   No current facility-administered medications on file prior to visit.    Allergies  Allergen Reactions  . Cialis [Tadalafil] Other (See Comments)  Hot flashes  . Fructose Nausea And Vomiting  . Morphine And Related Itching    PMHx:   Past Medical History:  Diagnosis Date  . Asthma    as a child  . Cancer [facility])    prostate  . ED (erectile dysfunction) of organic origin   . Gout   . History of colon polyps   . History of kidney stones   . History of positive PPD    06/ 2006--  CXR NORMAL  . History of prostate cancer    DEC 2008--  S/P  RADICAL PROSTATECTOMY  . Hyperlipidemia   . Hypertension   . Hypogonadism male   . Mixed hyperlipidemia   . Nocturia   . Sigmoid diverticulosis    MILD  . Type 2 diabetes mellitus ([place])    type 2    Immunization History    Administered Date(s) Administered  . DTaP 02/17/2002  . Influenza Whole 01/08/2013  . Influenza, High Dose Seasonal PF 02/26/2014, 12/24/2015, 03/21/2017, 04/14/2018, 12/27/2018  . Pneumococcal Conjugate-13 07/23/2015  . Pneumococcal Polysaccharide-23 02/18/1996, 01/03/2018  . Tdap 06/29/2011  . Zoster 03/23/2011    Past Surgical History:  Procedure Laterality Date  . ACHILLES TENDON REPAIR Right 1989  . COLONOSCOPY N/A last one 10-28-2010    due 10 yr f/u in 2022 - [facility]  . FINGER AMPUTATION  age 66   left Middle  . HOLMIUM LASER APPLICATION Right 70/48/8891   Procedure: HOLMIUM LASER APPLICATION;  Surgeon: Kathie Rhodes, MD;  [facility];  Service: Urology;  Laterality: Right;  . IR URETERAL STENT RIGHT NEW ACCESS W/O SEP NEPHROSTOMY CATH  02/05/2019  . NEGATIVE SLEEP STUDY  2010  per pt  . NEPHROLITHOTOMY Right 02/05/2019   Procedure: NEPHROLITHOTOMY PERCUTANEOUS, STENT PLACEMENT;  Surgeon: Kathie Rhodes, MD;  [facility];  Service: Urology;  Laterality: Right;  . PENILE PROSTHESIS IMPLANT N/A 10/28/2014   Procedure: IMPLANTATION 3 PIECE PENILE INFLATABLE PROTHESIS/COLOPLAST SCROTAL APPROACH;  Surgeon: Kathie Rhodes, MD;  Location: [place];  Service: Urology;  Laterality: N/A;  . PERCUTANEOUS NEPHROLITHOTRIPSY Left 04-04-2009// second look 04-15-2009  . ROBOT ASSISTED LAPAROSCOPIC RADICAL PROSTATECTOMY  Dec 2008  . SHOULDER OPEN ROTATOR CUFF REPAIR Right 05-11-2006  . URETEROLITHOTOMY  2002    FHx:    Reviewed / unchanged  SHx:    Reviewed / unchanged   Systems Review:  Constitutional: Denies fever, chills, wt changes, headaches, insomnia, fatigue, night sweats, change in appetite. Eyes: Denies redness, blurred vision, diplopia, discharge, itchy, watery eyes.  ENT: Denies discharge, congestion, post nasal drip, epistaxis, sore throat, earache, hearing loss, dental pain, tinnitus, vertigo, sinus pain, snoring.  CV: Denies chest pain,  palpitations, irregular heartbeat, syncope, dyspnea, diaphoresis, orthopnea, PND, claudication or edema. Respiratory: denies cough, dyspnea, DOE, pleurisy, hoarseness, laryngitis, wheezing.  Gastrointestinal: Denies dysphagia, odynophagia, heartburn, reflux, water brash, abdominal pain or cramps, nausea, vomiting, bloating, diarrhea, constipation, hematemesis, melena, hematochezia  or hemorrhoids. Genitourinary: Denies dysuria, frequency, urgency, nocturia, hesitancy, discharge, hematuria or flank pain. Musculoskeletal: Denies arthralgias, myalgias, stiffness, jt. swelling, pain, limping or strain/sprain.  Skin: Denies pruritus, rash, hives, warts, acne, eczema or change in skin lesion(s). Neuro: No weakness, tremor, incoordination, spasms, paresthesia or pain. Psychiatric: Denies confusion, memory loss or sensory loss. Endo: Denies change in weight, skin or hair change.  Heme/Lymph: No excessive bleeding, bruising or enlarged lymph nodes.  Physical Exam  BP 116/74   Pulse 60   Temp (!) 97.2 F (36.2 C)   Resp 16  Ht 5' 5.5" (1.664 m)   Wt 192 lb 12.8 oz (87.5 kg)   BMI 31.60 kg/m   Appears  well nourished, well groomed  and in no distress.  Eyes: PERRLA, EOMs, conjunctiva no swelling or erythema. Sinuses: No frontal/maxillary tenderness ENT/Mouth: EAC's clear, TM's nl w/o erythema, bulging. Nares clear w/o erythema, swelling, exudates. Oropharynx clear without erythema or exudates. Oral hygiene is good. Tongue normal, non obstructing. Hearing intact.  Neck: Supple. Thyroid not palpable. Car 2+/2+ without bruits, nodes or JVD. Chest: Respirations nl with BS clear & equal w/o rales, rhonchi, wheezing or stridor.  Cor: Heart sounds normal w/ regular rate and rhythm without sig. murmurs, gallops, clicks or rubs. Peripheral pulses normal and equal  without edema.  Abdomen: Soft & bowel sounds normal. Non-tender w/o guarding, rebound, hernias, masses or organomegaly.  Lymphatics:  Unremarkable.  Musculoskeletal: Full ROM all peripheral extremities, joint stability, 5/5 strength and normal gait.  Skin: Warm, dry without exposed rashes, lesions or ecchymosis apparent.  Neuro: Cranial nerves intact, reflexes equal bilaterally. Sensory-motor testing grossly intact. Tendon reflexes grossly intact.  Pysch: Alert & oriented x 3.  Insight and judgement nl & appropriate. No ideations.  Assessment and Plan:  1. Essential hypertension  - Continue medication, monitor blood pressure at home.  - Continue DASH diet.  Reminder to go to the ER if any CP,  SOB, nausea, dizziness, severe HA, changes vision/speech.  - CBC with Differential/Platelet - COMPLETE METABOLIC PANEL WITH GFR - Magnesium - TSH  2. Hyperlipidemia associated with type 2 diabetes mellitus ([place])  - Continue diet/meds, exercise,& lifestyle modifications.  - Continue monitor periodic cholesterol/liver & renal functions   - Lipid panel - TSH  3. Type 2 diabetes mellitus with stage 2 chronic kidney disease,  without long-term current use of insulin ([name])  - Continue diet, exercise  - Lifestyle modifications.  - Monitor appropriate labs.  - Hemoglobin A1c - Insulin, random  4. Vitamin D deficiency  - Continue supplementation.  - VITAMIN D 25 Hydroxy  5. Gout  - Uric acid  6. Medication management  - CBC with Differential/Platelet - COMPLETE METABOLIC PANEL WITH GFR - Magnesium - Lipid panel - TSH - Hemoglobin A1c - Insulin, random - VITAMIN D 25 Hydroxy  - Uric acid       Discussed  regular exercise, BP monitoring, weight control to achieve/maintain BMI less than 25 and discussed med and SE's. Recommended labs to assess and monitor clinical status with further disposition pending results of labs.  I discussed the assessment and treatment plan with the patient. The patient was provided an opportunity to ask questions and all were answered. The patient agreed with the plan and demonstrated  an understanding of the instructions.  I provided over 30 minutes of exam, counseling, chart review and  complex critical decision making.         The patient was advised to call back or seek an in-person evaluation if the symptoms worsen or if the condition fails to improve as anticipated.   Kirtland Bouchard, MD

## 2019-11-11 NOTE — Patient Instructions (Signed)

## 2019-11-12 ENCOUNTER — Ambulatory Visit (INDEPENDENT_AMBULATORY_CARE_PROVIDER_SITE_OTHER): Payer: Medicare Other | Admitting: Internal Medicine

## 2019-11-12 ENCOUNTER — Other Ambulatory Visit: Payer: Self-pay

## 2019-11-12 VITALS — BP 116/74 | HR 60 | Temp 97.2°F | Resp 16 | Ht 65.5 in | Wt 192.8 lb

## 2019-11-12 DIAGNOSIS — E1122 Type 2 diabetes mellitus with diabetic chronic kidney disease: Secondary | ICD-10-CM

## 2019-11-12 DIAGNOSIS — N182 Chronic kidney disease, stage 2 (mild): Secondary | ICD-10-CM

## 2019-11-12 DIAGNOSIS — E1169 Type 2 diabetes mellitus with other specified complication: Secondary | ICD-10-CM | POA: Diagnosis not present

## 2019-11-12 DIAGNOSIS — Z79899 Other long term (current) drug therapy: Secondary | ICD-10-CM

## 2019-11-12 DIAGNOSIS — M109 Gout, unspecified: Secondary | ICD-10-CM

## 2019-11-12 DIAGNOSIS — E559 Vitamin D deficiency, unspecified: Secondary | ICD-10-CM | POA: Diagnosis not present

## 2019-11-12 DIAGNOSIS — I10 Essential (primary) hypertension: Secondary | ICD-10-CM

## 2019-11-12 DIAGNOSIS — E785 Hyperlipidemia, unspecified: Secondary | ICD-10-CM

## 2019-11-13 LAB — COMPLETE METABOLIC PANEL WITH GFR
AG Ratio: 1.4 (calc) (ref 1.0–2.5)
ALT: 18 U/L (ref 9–46)
AST: 24 U/L (ref 10–35)
Albumin: 4.6 g/dL (ref 3.6–5.1)
Alkaline phosphatase (APISO): 86 U/L (ref 35–144)
BUN/Creatinine Ratio: 16 (calc) (ref 6–22)
BUN: 20 mg/dL (ref 7–25)
CO2: 29 mmol/L (ref 20–32)
Calcium: 9.8 mg/dL (ref 8.6–10.3)
Chloride: 104 mmol/L (ref 98–110)
Creat: 1.27 mg/dL — ABNORMAL HIGH (ref 0.70–1.18)
GFR, Est African American: 66 mL/min/{1.73_m2} (ref >=60)
GFR, Est Non African American: 57 mL/min/{1.73_m2} — ABNORMAL LOW (ref >=60)
Globulin: 3.2 g/dL (calc) (ref 1.9–3.7)
Glucose, Bld: 93 mg/dL (ref 65–99)
Potassium: 4.9 mmol/L (ref 3.5–5.3)
Sodium: 138 mmol/L (ref 135–146)
Total Bilirubin: 0.4 mg/dL (ref 0.2–1.2)
Total Protein: 7.8 g/dL (ref 6.1–8.1)

## 2019-11-13 LAB — CBC WITH DIFFERENTIAL/PLATELET
Absolute Monocytes: 504 cells/uL (ref 200–950)
Basophils Absolute: 34 cells/uL (ref 0–200)
Basophils Relative: 0.4 %
Eosinophils Absolute: 202 cells/uL (ref 15–500)
Eosinophils Relative: 2.4 %
HCT: 46 % (ref 38.5–50.0)
Hemoglobin: 15.5 g/dL (ref 13.2–17.1)
Lymphs Abs: 2646 cells/uL (ref 850–3900)
MCH: 30.9 pg (ref 27.0–33.0)
MCHC: 33.7 g/dL (ref 32.0–36.0)
MCV: 91.8 fL (ref 80.0–100.0)
MPV: 9.8 fL (ref 7.5–12.5)
Monocytes Relative: 6 %
Neutro Abs: 5015 cells/uL (ref 1500–7800)
Neutrophils Relative %: 59.7 %
Platelets: 261 10*3/uL (ref 140–400)
RBC: 5.01 10*6/uL (ref 4.20–5.80)
RDW: 13.1 % (ref 11.0–15.0)
Total Lymphocyte: 31.5 %
WBC: 8.4 10*3/uL (ref 3.8–10.8)

## 2019-11-13 LAB — TSH: TSH: 2.05 mIU/L (ref 0.40–4.50)

## 2019-11-13 LAB — LIPID PANEL
Cholesterol: 147 mg/dL (ref <200)
HDL: 33 mg/dL — ABNORMAL LOW (ref >=40)
LDL Cholesterol (Calc): 85 mg/dL (calc)
Non-HDL Cholesterol (Calc): 114 mg/dL (calc) (ref <130)
Total CHOL/HDL Ratio: 4.5 (calc) (ref <5.0)
Triglycerides: 191 mg/dL — ABNORMAL HIGH (ref <150)

## 2019-11-13 LAB — URIC ACID: Uric Acid, Serum: 5 mg/dL (ref 4.0–8.0)

## 2019-11-13 LAB — HEMOGLOBIN A1C
Hgb A1c MFr Bld: 6.3 % of total Hgb — ABNORMAL HIGH (ref <5.7)
Mean Plasma Glucose: 134 (calc)
eAG (mmol/L): 7.4 (calc)

## 2019-11-13 LAB — MAGNESIUM: Magnesium: 1.9 mg/dL (ref 1.5–2.5)

## 2019-11-13 LAB — INSULIN, RANDOM: Insulin: 20.1 u[IU]/mL — ABNORMAL HIGH

## 2019-11-13 LAB — VITAMIN D 25 HYDROXY (VIT D DEFICIENCY, FRACTURES): Vit D, 25-Hydroxy: 47 ng/mL (ref 30–100)

## 2019-11-13 NOTE — Progress Notes (Signed)
==========================================================  -  Kidney Functions are still Stage 3a and Stable ==========================================================  -  Total Chol = 147 and LDL Chol = 85 - Both  Excellent   - Very low risk for Heart Attack  / Stroke =============================================================  - Triglycerides ( 191  ) or fats in blood are too high  (goal is less than 150)    - Recommend avoid fried & greasy foods,  sweets / candy,   - Avoid white rice  (brown or wild rice or Quinoa is OK),   - Avoid white potatoes  (sweet potatoes are OK)   - Avoid anything made from white flour  - bagels, doughnuts, rolls, buns, biscuits, white and   wheat breads, pizza crust and traditional  pasta made of white flour & egg white  - (vegetarian pasta or spinach or wheat pasta is OK).    - Multi-grain bread is OK - like multi-grain flat bread or  sandwich thins.   - Avoid alcohol in excess.   - Exercise is also important. ==========================================================  -  A1c down slightly from 6.6% to now 6.3%, but still too high (goal or ideal is less than 5.7% )  ==========================================================  -  It is very important that you work harder with diet by  avoiding all foods that are white except chicken,   fish & calliflower.  - Avoid white rice  (brown & wild rice is OK),   - Avoid white potatoes  (sweet potatoes in moderation is OK),   White bread or wheat bread or anything made out of   white flour like bagels, donuts, rolls, buns, biscuits, cakes,  - pastries, cookies, pizza crust, and pasta (made from  white flour & egg whites)   - vegetarian pasta or spinach or wheat pasta is OK.  - Multigrain breads like Arnold's, Pepperidge Farm or   multigrain sandwich thins or high fiber breads like   [place] bread or "Dave's Killer" breads that are  4 to 5 grams fiber per slice !  are best.     Diet, exercise and weight loss can reverse and cure  diabetes in the early stages.   ==========================================================  -  Vit D = 47 - Low   - Vitamin D goal is between 70-100.   - Please INCREASE your Vitamin D 5,000 u to  2 capsules = 10,000 units  /day  - It is very important as a natural anti-inflammatory and helping the  immune system protect against viral infections, like the Covid-19    helping hair, skin, and nails, as well as reducing stroke and heart attack risk.   - It helps your bones and helps with mood.  - It also decreases numerous cancer risks so please take it as directed.   - Low Vit D is associated with a 200-300% higher risk for CANCER   and 200-300% higher risk for HEART   ATTACK  &  STROKE.    - It is also associated with higher death rate at younger ages,   autoimmune diseases like Rheumatoid arthritis, Lupus, Multiple Sclerosis.     - Also many other serious conditions, like depression, Alzheimer's  Dementia, infertility, muscle aches, fatigue, fibromyalgia - just to name a few ==========================================================  -  Uric Acid / Gout test - Normal - OK  ==========================================================  -  All Else - CBC - Electrolytes - Liver - Magnesium & Thyroid    - all  Normal / OK ==========================================================

## 2020-01-29 DIAGNOSIS — Z23 Encounter for immunization: Secondary | ICD-10-CM | POA: Diagnosis not present

## 2020-02-06 ENCOUNTER — Ambulatory Visit: Payer: Medicare Other | Admitting: Physician Assistant

## 2020-02-11 ENCOUNTER — Other Ambulatory Visit: Payer: Self-pay | Admitting: Internal Medicine

## 2020-02-11 DIAGNOSIS — E1122 Type 2 diabetes mellitus with diabetic chronic kidney disease: Secondary | ICD-10-CM

## 2020-02-11 DIAGNOSIS — N182 Chronic kidney disease, stage 2 (mild): Secondary | ICD-10-CM

## 2020-02-15 NOTE — Progress Notes (Signed)
MEDICARE ANNUAL WELLNESS VISIT AND OV  Assessment:   Assessment and Plan:   Encounter for Medicare annual wellness exam  1 year  Coronary atherosclerosis due to calcified coronary lesion Control blood pressure, cholesterol, glucose, increase exercise.   Essential hypertension - continue medications, DASH diet, exercise and monitor at home. Call if greater than 130/80.  -     CBC with Differential/Platelet -     COMPLETE METABOLIC PANEL WITH GFR -     TSH -     bisoprolol-hydrochlorothiazide (ZIAC) 5-6.25 MG tablet; Take 1 tablet by mouth daily. for blood pressure  Type 2 diabetes mellitus with stage 2 chronic kidney disease, without long-term current use of insulin ([place]) Discussed general issues about diabetes pathophysiology and management., Educational material distributed., Suggested low cholesterol diet., Encouraged aerobic exercise., Discussed foot care., Reminded to get yearly retinal exam. Get eye exam, numbers given -     Hemoglobin A1c  Hyperlipidemia associated with type 2 diabetes mellitus ([place]) check lipids, goal less than 70- discussed statin, stop zetia, start rosuvastatin 5 mg three days a week  decrease fatty foods, increase activity.  -     Lipid panel -     ezetimibe (ZETIA) 10 MG tablet; Take 1 tablet (10 mg total) by mouth daily.  CKD stage 2 due to type 2 diabetes mellitus ([name]) Increase fluids, avoid NSAIDS, monitor sugars, will monitor  Chronic gout involving toe without tophus, unspecified cause, unspecified laterality Gout- recheck Uric acid as needed, Diet discussed, continue medications.  Nephrolithiasis Push fluids, monitor  Medication management -     Magnesium  Vitamin D deficiency Continue supplement  Hx of prostate cancer; Erectile dysfunction following radical prostatectomy Follow up urology; checking PSA annually here  Depression, in full remission ([name])/ anxiety  Denies sx; full remission off of medication, monitor stress management  techniques discussed, increase water, good sleep hygiene discussed, increase exercise, and increase veggies.   Obesity (BMI 30.0-34.9) - follow up 3 months for progress monitoring - increase veggies, decrease carbs - long discussion about weight loss, diet, and exercise  Long discussion about weight loss, diet, and exercise Discussed goal weight weight loss goal (<180 lb) Patient will work on increasing activity, portions Will start the patient on phentermine- hand out given and AE's discussed, will do close follow up. 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 Further disposition pending results of labs. Discussed med's effects and SE's.    Over 30 minutes of exam, counseling, chart review, and critical decision making was performed  Future Appointments  Date Time Provider [place]  03/31/2020  9:30 AM Liane Comber, NP [facility] None  05/22/2020 10:00 AM [name], MD [facility] None  02/17/2021  9:00 AM Liane Comber, NP [facility] None     Plan:   During the course of the visit the patient was educated and counseled about appropriate screening and preventive services including:    Pneumococcal vaccine   Influenza vaccine  Td vaccine  Screening electrocardiogram  Bone densitometry screening  Colorectal cancer screening  Diabetes screening  Glaucoma screening  Nutrition counseling   Advanced directives: requested   HPI 69 y.o.  AA male  presents for 3 month follow up on hypertension, cholesterol, diabetes and vitamin D deficiency and wellness visit. Marland Kitchen    His blood pressure has been controlled at home, today his BP  is BP: 130/78.  Wife is linda, s/p kidney transplant, also taking care of her 72 y/o aunt with myeloma.   He had nephrolithotomy Oct 2020 with Dr. Karsten Ro due to pain after it was postponed due to  pandemic. Hx of prostate cancer with prostatectomy in 2008, with ED. [facility]. Last PSA here was <0.1 in 04/2019.   He has been having right shoulder pain, has seen Dr. Durward Fortes for left shoulder in 2018. S/p rotator cuff repair in 2008. Had injection and doing better.   Depression in remission off of medications.   BMI is Body mass index is 31.79 kg/m., he is working on diet and exercise. He has been prescribed phentermine but admits never picked up, discussed again and will represcribe. He will monitor for SE closely after discussion of risks. 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  Wt Readings from Last 3 Encounters:  02/18/20 194 lb (88 kg)  11/12/19 192 lb 12.8 oz (87.5 kg)  09/05/19 195 lb (88.5 kg)   He has a personal history of prostate cancer s/p radial proctectomy in 2008.   He does not workout. He denies chest pain, shortness of breath, dizziness.   He is on cholesterol medication (zeita 10 mg daily has never tried statin ) and denies myalgias. His cholesterol is at goal. The cholesterol was:   Lab Results  Component Value Date   CHOL 147 11/12/2019   HDL 33 (L) 11/12/2019   LDLCALC 85 11/12/2019   TRIG 191 (H) 11/12/2019   CHOLHDL 4.5 11/12/2019    He has been working on diet and exercise for diabetes with diabetic chronic kidney disease  Hyperlipidemia on zetia  he is on bASA denies  paresthesia of the feet, polydipsia, polyuria and visual disturbances.  He checks occasionally fasting, reports around 110s Last A1C was:   Lab Results  Component Value Date   HGBA1C 6.3 (H) 11/12/2019   He has CKD II associated with T2DM on ACEi. Last GFR:  Lab Results  Component Value Date   GFRAA 66 11/12/2019    Patient is on Vitamin D supplement. Lab Results  Component Value Date   VD25OH 47 11/12/2019   Patient is on allopurinol for gout and does not report a recent flare.   Lab Results  Component Value Date   LABURIC 5.0 11/12/2019     Current Medications:  Current Outpatient Medications on File Prior to Visit  Medication Sig Dispense Refill  . allopurinol (ZYLOPRIM) 300 MG tablet Take 1 tablet Daily to Prevent Gout 90 tablet 3  . aspirin 81 MG tablet Take 81 mg by mouth daily.      . bisoprolol-hydrochlorothiazide (ZIAC) 5-6.25 MG tablet Take 1 tablet Daily for BP 90 tablet 3  . Blood Glucose Monitoring Suppl (FREESTYLE FREEDOM LITE) w/Device KIT Check blood sugar 1 time daily-DX-E11.22 1 kit 0  . diclofenac Sodium (VOLTAREN) 1 % GEL Apply 1 application topically 4 (four) times daily.    . fluticasone (FLONASE) 50 MCG/ACT nasal spray Place 1 spray into both nostrils daily as needed for allergies. (Patient taking differently: Place 2 sprays into both nostrils daily as needed for allergies. ) 20 g 1  . glucose blood (FREESTYLE LITE) test strip Check blood sugar 1 time daily-E11.22 100 each 3  . Lancets (FREESTYLE) lancets Check blood sugar 1 time daily-E11.22 100 each 3  . metFORMIN (GLUCOPHAGE) 500 MG tablet TAKE 2 TABLETS TWICE  A DAY WITH MEALS FOR DIABETES 360 tablet 3  . Multiple Vitamins-Minerals (MULTIVITAMIN WITH MINERALS) tablet Take 1 tablet by mouth daily.      . Omega-3 Fatty Acids (FISH OIL) 1000 MG CAPS Take 1,000 mg by mouth daily.     Marland Kitchen terbinafine (LAMISIL) 250 MG tablet Take 1 tablet Daily for Toenail Fungus 90 tablet 1  . valsartan (DIOVAN) 320 MG tablet Take 1 tablet Daily for BP 90 tablet 3  . VITAMIN D PO Take 5,000 Units by mouth daily.    . Wheat Dextrin (BENEFIBER DRINK MIX PO) Take 1 Package by mouth daily.      No current facility-administered medications on file prior to visit.   Medical History:  Past Medical History:  Diagnosis Date  . Asthma    as a child  . Cancer [facility])    prostate  . ED (erectile dysfunction) of organic origin   . Gout   . History of colon polyps   . History of kidney stones   . History of positive  PPD    06/ 2006--  CXR NORMAL  . History of prostate cancer    DEC 2008--  S/P  RADICAL PROSTATECTOMY  . Hyperlipidemia   . Hypertension   . Hypogonadism male   . Mixed hyperlipidemia   . Nocturia   . Sigmoid diverticulosis    MILD  . Type 2 diabetes mellitus ([place])    type 2   Preventative care: Immunization History  Administered Date(s) Administered  . DTaP 02/17/2002  . Influenza Whole 01/08/2013  . Influenza, High Dose Seasonal PF 02/26/2014, 12/24/2015, 03/21/2017, 04/14/2018, 12/27/2018  . Influenza-Unspecified 01/29/2020  . Pneumococcal Conjugate-13 07/23/2015  . Pneumococcal Polysaccharide-23 02/18/1996, 01/03/2018  . Tdap 06/29/2011  . Zoster 03/23/2011    Last colonoscopy: 10/28/2010 - Dr Deatra Ina - 83 yr f/u.  EGD 2012 DEXA 2011 CXR 2013- never smoked  Tdap 2013 Pneumonia 2019 Prevnar 13: 2017 Influenza 01/2020 Shingles 2012 covid 19: has had 3/3, pfizer - he doesn't have card, will send info  Names of Other Physician/Practitioners you currently use: 1. [facility] here for primary care 2. Dr Frederico Hamman, eye doctor, 2019, he states will schedule prior to the end of the year 3. Dr Milford Cage, dentist, last visit Aug 2019    Allergies Allergies  Allergen Reactions  . Cialis [Tadalafil] Other (See Comments)    Hot flashes  . Fructose Nausea And Vomiting  . Morphine And Related Itching    SURGICAL HISTORY He  has a past surgical history that includes Finger amputation (age 59); Robot assisted laparoscopic radical prostatectomy (Dec 2008); Colonoscopy (N/A, last one 10-28-2010); Percutaneous nephrolithotripsy (Left, 04-04-2009// second look 04-15-2009); Ureterolithotomy (2002); Shoulder open rotator cuff repair (Right, 05-11-2006); Achilles tendon repair (Right, 1989); NEGATIVE SLEEP STUDY (2010  per pt); Penile prosthesis implant (N/A, 10/28/2014); IR URETERAL STENT RIGHT NEW ACCESS W/O SEP NEPHROSTOMY CATH (02/05/2019);  Nephrolithotomy (Right, 02/05/2019); and Holmium laser application (Right, 22/05/5425). FAMILY HISTORY His family history includes Alcohol abuse in his father; Arthritis in his mother; Diabetes in his mother; Heart disease in his mother; Hypertension in his father. SOCIAL HISTORY He  reports that he has never smoked. He has never used smokeless tobacco. He reports that he does not drink alcohol and does not use drugs.  MEDICARE WELLNESS OBJECTIVES: Physical activity: Current Exercise Habits: Home exercise routine, Type of exercise: walking, Time (Minutes): 20, Frequency (Times/Week): 3, Weekly Exercise (Minutes/Week): 60, Intensity: Mild, Exercise limited by: None identified  Cardiac risk factors: Cardiac Risk Factors include: advanced age (>73mn, >>35women);dyslipidemia;hypertension;obesity (BMI >30kg/m2);diabetes mellitus;male gender Depression/mood screen:   [facility]2020  Decreased Interest 0  Down, Depressed, Hopeless 0  PHQ - 2 Score 0  Altered sleeping 0  Tired, decreased energy 0  Change in appetite 0  Feeling bad or failure about yourself  0  Trouble concentrating 0  Moving slowly or fidgety/restless 0  Suicidal thoughts 0  PHQ-9 Score 0  Difficult doing work/chores Not difficult at all    ADLs:  In your present state of health, do you have any difficulty performing the following activities: 02/18/2020 11/11/2019  Hearing? N N  Vision? N N  Difficulty concentrating or making decisions? N N  Walking or climbing stairs? N N  Dressing or bathing? N N  Doing errands, shopping? N N  Some recent data might be hidden     Cognitive Testing  Alert? Yes  Normal Appearance?Yes  Oriented to person? Yes  Place? Yes   Time? Yes  Recall of three objects?  Yes  Can perform simple calculations? Yes  Displays appropriate judgment?Yes  Can read the correct time from a watch face?Yes  EOL planning: Does Patient Have a Medical Advance Directive?: Yes Type of  Advance Directive: Healthcare Power of Attorney, Living will Does patient want to make changes to medical advance directive?: No - Patient declined Copy of [place] Chart?: No - copy requested Would patient like information on creating a medical advance directive?: No - Patient declined  Review of Systems:  Review of Systems  Constitutional: Negative for malaise/fatigue and weight loss.  HENT: Negative for hearing loss and tinnitus.   Eyes: Negative for blurred vision and double vision.  Respiratory: Negative for cough, sputum production, shortness of breath and wheezing.   Cardiovascular: Negative for chest pain, palpitations, orthopnea, claudication, leg swelling and PND.  Gastrointestinal: Negative for abdominal pain, blood in stool, constipation, diarrhea, heartburn, melena, nausea and vomiting.  Genitourinary: Negative.   Musculoskeletal: Negative for falls, joint pain and myalgias.  Skin: Negative for rash.  Neurological: Negative for dizziness, tingling, sensory change, weakness and headaches.  Endo/Heme/Allergies: Negative for polydipsia.  Psychiatric/Behavioral: Negative.  Negative for depression, memory loss, substance abuse and suicidal ideas. The patient is not nervous/anxious and does not have insomnia.   All other systems reviewed and are negative.  Physical Exam: BP 130/78   Pulse (!) 58   Temp (!) 97.3 F (36.3 C)   Wt 194 lb (88 kg)   SpO2 98%   BMI 31.79 kg/m  Wt Readings from Last 3 Encounters:  02/18/20 194 lb (88 kg)  11/12/19 192 lb 12.8 oz (87.5 kg)  09/05/19 195 lb (88.5 kg)   General Appearance: Well nourished, in no apparent distress. Eyes: PERRLA, EOMs, conjunctiva no swelling or erythema Sinuses: No Frontal/maxillary tenderness ENT/Mouth: Ext aud canals clear, TMs without erythema, bulging. No erythema, swelling, or exudate on post pharynx.  Tonsils not swollen or erythematous. Hearing decreased but has hearing aids Neck:  Supple, thyroid normal.  Respiratory: Respiratory effort normal, BS equal bilaterally without rales, rhonchi, wheezing or stridor.  Cardio: RRR with no MRGs. Brisk peripheral pulses without edema.  Abdomen: Soft, + BS.  Non tender, no guarding, rebound, hernias, masses. Lymphatics: Non tender without lymphadenopathy.  Musculoskeletal: Full ROM, 5/5 strength, Normal gait.   He has left hand 3rd digit well healed amputation at PIP joint.  Skin: Warm, dry without rashes, lesions, ecchymosis.  Neuro: Cranial nerves intact. No cerebellar symptoms.  Psych: Awake and oriented X 3, normal affect, Insight and Judgment appropriate.   Medicare Attestation I have personally reviewed: The patient's medical and social history Their use of alcohol, tobacco or illicit drugs Their current medications and supplements The patient's functional ability including ADLs,fall risks, home safety risks, cognitive, and hearing and visual impairment Diet and physical activities Evidence for depression or mood disorders  The patient's weight, height, BMI, and visual acuity have been recorded in the chart.  I have made referrals, counseling, and provided education to the patient based on review of the above and I have provided the patient with a written personalized care plan for preventive services.     Izora Ribas, NP 12:36 PM [facility]

## 2020-02-18 ENCOUNTER — Ambulatory Visit (INDEPENDENT_AMBULATORY_CARE_PROVIDER_SITE_OTHER): Payer: Medicare Other | Admitting: Adult Health

## 2020-02-18 ENCOUNTER — Encounter: Payer: Self-pay | Admitting: Adult Health

## 2020-02-18 ENCOUNTER — Other Ambulatory Visit: Payer: Self-pay

## 2020-02-18 VITALS — BP 130/78 | HR 58 | Temp 97.3°F | Wt 194.0 lb

## 2020-02-18 DIAGNOSIS — N182 Chronic kidney disease, stage 2 (mild): Secondary | ICD-10-CM

## 2020-02-18 DIAGNOSIS — F325 Major depressive disorder, single episode, in full remission: Secondary | ICD-10-CM

## 2020-02-18 DIAGNOSIS — Z0001 Encounter for general adult medical examination with abnormal findings: Secondary | ICD-10-CM | POA: Diagnosis not present

## 2020-02-18 DIAGNOSIS — N200 Calculus of kidney: Secondary | ICD-10-CM

## 2020-02-18 DIAGNOSIS — E669 Obesity, unspecified: Secondary | ICD-10-CM

## 2020-02-18 DIAGNOSIS — N5231 Erectile dysfunction following radical prostatectomy: Secondary | ICD-10-CM

## 2020-02-18 DIAGNOSIS — E1122 Type 2 diabetes mellitus with diabetic chronic kidney disease: Secondary | ICD-10-CM | POA: Diagnosis not present

## 2020-02-18 DIAGNOSIS — Z79899 Other long term (current) drug therapy: Secondary | ICD-10-CM

## 2020-02-18 DIAGNOSIS — E785 Hyperlipidemia, unspecified: Secondary | ICD-10-CM | POA: Diagnosis not present

## 2020-02-18 DIAGNOSIS — E1169 Type 2 diabetes mellitus with other specified complication: Secondary | ICD-10-CM | POA: Diagnosis not present

## 2020-02-18 DIAGNOSIS — E559 Vitamin D deficiency, unspecified: Secondary | ICD-10-CM | POA: Diagnosis not present

## 2020-02-18 DIAGNOSIS — F411 Generalized anxiety disorder: Secondary | ICD-10-CM

## 2020-02-18 DIAGNOSIS — Z8546 Personal history of malignant neoplasm of prostate: Secondary | ICD-10-CM

## 2020-02-18 DIAGNOSIS — Z0000 Encounter for general adult medical examination without abnormal findings: Secondary | ICD-10-CM

## 2020-02-18 DIAGNOSIS — R6889 Other general symptoms and signs: Secondary | ICD-10-CM | POA: Diagnosis not present

## 2020-02-18 DIAGNOSIS — S68119S Complete traumatic metacarpophalangeal amputation of unspecified finger, sequela: Secondary | ICD-10-CM

## 2020-02-18 DIAGNOSIS — M1A9XX0 Chronic gout, unspecified, without tophus (tophi): Secondary | ICD-10-CM

## 2020-02-18 DIAGNOSIS — F329 Major depressive disorder, single episode, unspecified: Secondary | ICD-10-CM

## 2020-02-18 MED ORDER — ROSUVASTATIN CALCIUM 5 MG PO TABS: ORAL_TABLET | ORAL | 3 refills | Status: DC

## 2020-02-18 MED ORDER — PHENTERMINE HCL 37.5 MG PO TABS: ORAL_TABLET | ORAL | 2 refills | Status: DC

## 2020-02-18 MED ORDER — PHENTERMINE HCL 37.5 MG PO TABS: 37.5000 mg | ORAL_TABLET | Freq: Every day | ORAL | 2 refills | Status: DC

## 2020-02-18 NOTE — Patient Instructions (Addendum)
Mr. Cody Zamora , Thank you for taking time to come for your Medicare Wellness Visit. I appreciate your ongoing commitment to your health goals. Please review the following plan we discussed and let me know if I can assist you in the future.   These are the goals we discussed: Goals    . Exercise 3x per week (30 min per time)    . Peak Blood Glucose<130       This is a list of the screening recommended for you and due dates:  Health Maintenance  Topic Date Due  . COVID-19 Vaccine (1) Never done  . Eye exam for diabetics  05/06/2017  . Complete foot exam   05/02/2020  . Hemoglobin A1C  05/14/2020  . Colon Cancer Screening  10/27/2020  . Tetanus Vaccine  06/28/2021  . Flu Shot  Completed  .  Hepatitis C: One time screening is recommended by Center for Disease Control  (CDC) for  adults born from [birth year] through 1967.   Completed  . Pneumonia vaccines  Completed     General weight loss tips    Drink 1/2 your body weight in fluid ounces of water daily; drink a tall glass of water 30 min before meals  Before eating, ask yourself if you are hungry, bored, stressed or even just thirsty   Always eat at the table; avoid eating while distracted  Don't eat until you're stuffed- eat slowly, listen to your stomach and eat until you are 80% full   Aim for each meal or snack to have fiber + protein + healthy fats = satisfying   Try eating off of a salad plate; wait 10 min after finishing before going back for seconds  Start by eating the vegetables on your plate; aim for 50% of your meals to be fruits and vegetables  Then eat your protein - lean meats (grass fed if possible), fish, beans, nuts/seeds in moderation  Eat your carbs/starch last ONLY if you still are hungry, and choose high fiber options as much as possible (brown rice, oats, quinoa, farro, etc). If you get full before finishing, don't feel bad about leaving some on your plate  Avoid sugar, flour, processed - the closer it looks  to it's original form in nature, typically the better it is for you, and will avoid triggering a glucose spike that will store your calories as fat and make you more hungry  Splurge in moderation if needed - "assign" meals when you get to splurge and have the "bad stuff" - I like to follow a 80% - 20% plan- "healthy" choices 80 % of the time, "splurge" choices in moderation 20% of the time  Simple equation is: Calories out > calories in = weight loss - even if you eat the bad stuff, if you limit portions, you will still lose weight, just not as fast  Make sustainable changes - this is a journey to build lifelong healthy habits! A healthy change should help you feel energized, focused and light on your feet. If you make a change and you feel miserable doing it, please ask for help :)        Phentermine tablets or capsules What is this medicine? PHENTERMINE (FEN ter meen) decreases your appetite. It is used with a reduced calorie diet and exercise to help you lose weight. This medicine may be used for other purposes; ask your health care provider or pharmacist if you have questions. COMMON BRAND NAME(S): Adipex-P, Atti-Plex P, Atti-Plex P Spansule,  Viona Gilmore, Pro-Fast, Tara-8 What should I tell my health care provider before I take this medicine? They need to know if you have any of these conditions:  agitation or nervousness  diabetes  glaucoma  heart disease  high blood pressure  history of drug abuse or addiction  history of stroke  kidney disease  lung disease called Primary Pulmonary Hypertension (PPH)  taken an MAOI like Carbex, Eldepryl, Marplan, Nardil, or Parnate in last 14 days  taking stimulant medicines for attention disorders, weight loss, or to stay awake  thyroid disease  an unusual or allergic reaction to phentermine, other medicines, foods, dyes, or preservatives  pregnant or trying to get pregnant  breast-feeding How should I use this  medicine? Take this medicine by mouth with a glass of water. Follow the directions on the prescription label. Take your medicine at regular intervals. Do not take it more often than directed. Do not stop taking except on your doctor's advice. Talk to your pediatrician regarding the use of this medicine in children. While this drug may be prescribed for children 17 years or older for selected conditions, precautions do apply. Overdosage: If you think you have taken too much of this medicine contact a poison control center or emergency room at once. NOTE: This medicine is only for you. Do not share this medicine with others. What if I miss a dose? If you miss a dose, take it as soon as you can. If it is almost time for your next dose, take only that dose. Do not take double or extra doses. What may interact with this medicine? Do not take this medicine with any of the following medications:  MAOIs like Carbex, Eldepryl, Marplan, Nardil, and Parnate This medicine may also interact with the following medications:  alcohol  certain medicines for depression, anxiety, or psychotic disorders  certain medicines for high blood pressure  linezolid  medicines for colds or breathing difficulties like pseudoephedrine or phenylephrine  medicines for diabetes  sibutramine  stimulant medicines for attention disorders, weight loss, or to stay awake This list may not describe all possible interactions. Give your health care provider a list of all the medicines, herbs, non-prescription drugs, or dietary supplements you use. Also tell them if you smoke, drink alcohol, or use illegal drugs. Some items may interact with your medicine. What should I watch for while using this medicine? Visit your doctor or health care provider for regular checks on your progress. Do not stop taking except on your health care provider's advice. You may develop a severe reaction. Your health care provider will tell you how much  medicine to take. Do not take this medicine close to bedtime. It may prevent you from sleeping. You may get drowsy or dizzy. Do not drive, use machinery, or do anything that needs mental alertness until you know how this medicine affects you. Do not stand or sit up quickly, especially if you are an older patient. This reduces the risk of dizzy or fainting spells. Alcohol may increase dizziness and drowsiness. Avoid alcoholic drinks. This medicine may affect blood sugar levels. Ask your healthcare provider if changes in diet or medicines are needed if you have diabetes. Women should inform their health care provider if they wish to become pregnant or think they might be pregnant. Losing weight while pregnant is not advised and may cause harm to the unborn child. Talk to your health care provider for more information. What side effects may I notice from receiving this medicine?  Side effects that you should report to your doctor or health care professional as soon as possible:  allergic reactions like skin rash, itching or hives, swelling of the face, lips, or tongue  breathing problems  changes in emotions or moods  changes in vision  chest pain or chest tightness  fast, irregular heartbeat  feeling faint or lightheaded  increased blood pressure  irritable  restlessness  tremors  seizures  signs and symptoms of a stroke like changes in vision; confusion; trouble speaking or understanding; severe headaches; sudden numbness or weakness of the face, arm or leg; trouble walking; dizziness; loss of balance or coordination  unusually weak or tired Side effects that usually do not require medical attention (report to your doctor or health care professional if they continue or are bothersome):  changes in taste  constipation or diarrhea  dizziness  dry mouth  headache  trouble sleeping  upset stomach This list may not describe all possible side effects. Call your doctor for  medical advice about side effects. You may report side effects to FDA at 1-800-FDA-1088. Where should I keep my medicine? Keep out of the reach of children. This medicine can be abused. Keep your medicine in a safe place to protect it from theft. Do not share this medicine with anyone. Selling or giving away this medicine is dangerous and against the law. This medicine may cause harm and death if it is taken by other adults, children, or pets. Return medicine that has not been used to an official disposal site. Contact the DEA at 865-462-1084 or your city/county government to find a site. If you cannot return the medicine, mix any unused medicine with a substance like cat litter or coffee grounds. Then throw the medicine away in a sealed container like a sealed bag or coffee can with a lid. Do not use the medicine after the expiration date. Store at room temperature between 20 and 25 degrees C (68 and 77 degrees F). Keep container tightly closed. NOTE: This sheet is a summary. It may not cover all possible information. If you have questions about this medicine, talk to your doctor, pharmacist, or health care provider.  2020 Elsevier/Gold Standard (2019-02-09 12:54:20)

## 2020-02-19 ENCOUNTER — Encounter: Payer: Self-pay | Admitting: Internal Medicine

## 2020-02-19 LAB — CBC WITH DIFFERENTIAL/PLATELET
Absolute Monocytes: 504 cells/uL (ref 200–950)
Basophils Absolute: 22 cells/uL (ref 0–200)
Basophils Relative: 0.3 %
Eosinophils Absolute: 144 cells/uL (ref 15–500)
Eosinophils Relative: 2 %
HCT: 47.7 % (ref 38.5–50.0)
Hemoglobin: 15.5 g/dL (ref 13.2–17.1)
Lymphs Abs: 3038 cells/uL (ref 850–3900)
MCH: 29.7 pg (ref 27.0–33.0)
MCHC: 32.5 g/dL (ref 32.0–36.0)
MCV: 91.4 fL (ref 80.0–100.0)
MPV: 9.4 fL (ref 7.5–12.5)
Monocytes Relative: 7 %
Neutro Abs: 3492 cells/uL (ref 1500–7800)
Neutrophils Relative %: 48.5 %
Platelets: 269 10*3/uL (ref 140–400)
RBC: 5.22 10*6/uL (ref 4.20–5.80)
RDW: 13.5 % (ref 11.0–15.0)
Total Lymphocyte: 42.2 %
WBC: 7.2 10*3/uL (ref 3.8–10.8)

## 2020-02-19 LAB — COMPLETE METABOLIC PANEL WITH GFR
AG Ratio: 1.6 (calc) (ref 1.0–2.5)
ALT: 19 U/L (ref 9–46)
AST: 24 U/L (ref 10–35)
Albumin: 4.7 g/dL (ref 3.6–5.1)
Alkaline phosphatase (APISO): 83 U/L (ref 35–144)
BUN/Creatinine Ratio: 18 (calc) (ref 6–22)
BUN: 23 mg/dL (ref 7–25)
CO2: 29 mmol/L (ref 20–32)
Calcium: 10.1 mg/dL (ref 8.6–10.3)
Chloride: 101 mmol/L (ref 98–110)
Creat: 1.3 mg/dL — ABNORMAL HIGH (ref 0.70–1.18)
GFR, Est African American: 64 mL/min/{1.73_m2} (ref >=60)
GFR, Est Non African American: 55 mL/min/{1.73_m2} — ABNORMAL LOW (ref >=60)
Globulin: 2.9 g/dL (calc) (ref 1.9–3.7)
Glucose, Bld: 105 mg/dL — ABNORMAL HIGH (ref 65–99)
Potassium: 4.6 mmol/L (ref 3.5–5.3)
Sodium: 137 mmol/L (ref 135–146)
Total Bilirubin: 0.3 mg/dL (ref 0.2–1.2)
Total Protein: 7.6 g/dL (ref 6.1–8.1)

## 2020-02-19 LAB — HEMOGLOBIN A1C
Hgb A1c MFr Bld: 6.5 % of total Hgb — ABNORMAL HIGH (ref <5.7)
Mean Plasma Glucose: 140 (calc)
eAG (mmol/L): 7.7 (calc)

## 2020-02-19 LAB — TSH: TSH: 1.71 mIU/L (ref 0.40–4.50)

## 2020-02-19 LAB — LIPID PANEL
Cholesterol: 140 mg/dL (ref <200)
HDL: 30 mg/dL — ABNORMAL LOW (ref >=40)
LDL Cholesterol (Calc): 88 mg/dL (calc)
Non-HDL Cholesterol (Calc): 110 mg/dL (calc) (ref <130)
Total CHOL/HDL Ratio: 4.7 (calc) (ref <5.0)
Triglycerides: 123 mg/dL (ref <150)

## 2020-02-19 LAB — MAGNESIUM: Magnesium: 2.1 mg/dL (ref 1.5–2.5)

## 2020-03-21 NOTE — Progress Notes (Deleted)
Assessment and Plan:  1. Obesity (BMI 30.0-34.9) ***  2. Medication management ***  3. Essential hypertension ***     Further disposition pending results of labs. Discussed med's effects and SE's.   Over 30 minutes of exam, counseling, chart review, and critical decision making was performed.   Future Appointments  Date Time Provider [place]  03/31/2020  9:30 AM Liane Comber, NP [facility] None  05/22/2020 10:00 AM [name], MD [facility] None  02/17/2021  9:00 AM Liane Comber, NP [facility] None    ------------------------------------------------------------------------------------------------------------------   HPI There were no vitals taken for this visit.  69 y.o.male with obesity, T2DM, hyperlipidemia, htn presents for 1 month follow up after initiation of phentermine for weight loss.    he is prescribed phentermine for weight loss.  While on the medication they have lost {NUMBERS 0-12:18577} lbs since last visit. They deny palpitations, anxiety, trouble sleeping, elevated BP.   Today their BP is    He {DOES_DOES JTT:01779} workout. He denies chest pain, shortness of breath, dizziness.  BMI is There is no height or weight on file to calculate BMI., he is working on diet and exercise. Wt Readings from Last 3 Encounters:  02/18/20 194 lb (88 kg)  11/12/19 192 lb 12.8 oz (87.5 kg)  09/05/19 195 lb (88.5 kg)   Typical breakfast: Typical lunch:  Typical dinner: Exercise:  Water intake:     Past Medical History:  Diagnosis Date  . Asthma    as a child  . Cancer [facility])    prostate  . ED (erectile dysfunction) of organic origin   . Gout   . History of colon polyps   . History of kidney stones   . History of positive PPD    06/ 2006--  CXR NORMAL  . History of prostate cancer    DEC 2008--  S/P  RADICAL PROSTATECTOMY  . Hyperlipidemia   . Hypertension   . Hypogonadism male   . Mixed hyperlipidemia   . Nocturia   . Sigmoid  diverticulosis    MILD  . Type 2 diabetes mellitus ([name])    type 2     Allergies  Allergen Reactions  . Cialis [Tadalafil] Other (See Comments)    Hot flashes  . Fructose Nausea And Vomiting  . Morphine And Related Itching    Current Outpatient Medications on File Prior to Visit  Medication Sig  . allopurinol (ZYLOPRIM) 300 MG tablet Take 1 tablet Daily to Prevent Gout  . aspirin 81 MG tablet Take 81 mg by mouth daily.    . bisoprolol-hydrochlorothiazide (ZIAC) 5-6.25 MG tablet Take 1 tablet Daily for BP  . Blood Glucose Monitoring Suppl (FREESTYLE FREEDOM LITE) w/Device KIT Check blood sugar 1 time daily-DX-E11.22  . diclofenac Sodium (VOLTAREN) 1 % GEL Apply 1 application topically 4 (four) times daily.  . fluticasone (FLONASE) 50 MCG/ACT nasal spray Place 1 spray into both nostrils daily as needed for allergies. (Patient taking differently: Place 2 sprays into both nostrils daily as needed for allergies. )  . glucose blood (FREESTYLE LITE) test strip Check blood sugar 1 time daily-E11.22  . Lancets (FREESTYLE) lancets Check blood sugar 1 time daily-E11.22  . metFORMIN (GLUCOPHAGE) 500 MG tablet TAKE 2 TABLETS TWICE A DAY WITH MEALS FOR DIABETES  . Multiple Vitamins-Minerals (MULTIVITAMIN WITH MINERALS) tablet Take 1 tablet by mouth daily.    . Omega-3 Fatty Acids (FISH OIL) 1000 MG CAPS Take 1,000 mg by mouth daily.   . phentermine (ADIPEX-P) 37.5 MG  tablet Take 1/2-1 tab daily as needed for appetite and weight loss.  . rosuvastatin (CRESTOR) 5 MG tablet Take 1 tab 3 nights a week for cholesterol.  . terbinafine (LAMISIL) 250 MG tablet Take 1 tablet Daily for Toenail Fungus  . valsartan (DIOVAN) 320 MG tablet Take 1 tablet Daily for BP  . VITAMIN D PO Take 5,000 Units by mouth daily.  . Wheat Dextrin (BENEFIBER DRINK MIX PO) Take 1 Package by mouth daily.    No current facility-administered medications on file prior to visit.    ROS: all negative except above.   Physical  Exam:  There were no vitals taken for this visit.  General Appearance: Well nourished, in no apparent distress. Eyes: PERRLA, EOMs, conjunctiva no swelling or erythema Sinuses: No Frontal/maxillary tenderness ENT/Mouth: Ext aud canals clear, TMs without erythema, bulging. No erythema, swelling, or exudate on post pharynx.  Tonsils not swollen or erythematous. Hearing normal.  Neck: Supple, thyroid normal.  Respiratory: Respiratory effort normal, BS equal bilaterally without rales, rhonchi, wheezing or stridor.  Cardio: RRR with no MRGs. Brisk peripheral pulses without edema.  Abdomen: Soft, + BS.  Non tender, no guarding, rebound, hernias, masses. Lymphatics: Non tender without lymphadenopathy.  Musculoskeletal: Full ROM, 5/5 strength, normal gait.  Skin: Warm, dry without rashes, lesions, ecchymosis.  Neuro: Cranial nerves intact. Normal muscle tone, no cerebellar symptoms. Sensation intact.  Psych: Awake and oriented X 3, normal affect, Insight and Judgment appropriate.     Izora Ribas, NP 1:05 PM [facility]

## 2020-03-31 ENCOUNTER — Ambulatory Visit: Payer: Medicare Other | Admitting: Adult Health

## 2020-05-22 ENCOUNTER — Other Ambulatory Visit: Payer: Self-pay

## 2020-05-22 ENCOUNTER — Encounter: Payer: Self-pay | Admitting: Internal Medicine

## 2020-05-22 ENCOUNTER — Ambulatory Visit (INDEPENDENT_AMBULATORY_CARE_PROVIDER_SITE_OTHER): Payer: Medicare PPO | Admitting: Internal Medicine

## 2020-05-22 VITALS — BP 120/76 | HR 57 | Temp 96.1°F | Resp 16 | Ht 65.0 in | Wt 192.4 lb

## 2020-05-22 DIAGNOSIS — M109 Gout, unspecified: Secondary | ICD-10-CM | POA: Diagnosis not present

## 2020-05-22 DIAGNOSIS — Z136 Encounter for screening for cardiovascular disorders: Secondary | ICD-10-CM | POA: Diagnosis not present

## 2020-05-22 DIAGNOSIS — Z79899 Other long term (current) drug therapy: Secondary | ICD-10-CM

## 2020-05-22 DIAGNOSIS — I10 Essential (primary) hypertension: Secondary | ICD-10-CM

## 2020-05-22 DIAGNOSIS — Z125 Encounter for screening for malignant neoplasm of prostate: Secondary | ICD-10-CM

## 2020-05-22 DIAGNOSIS — Z0000 Encounter for general adult medical examination without abnormal findings: Secondary | ICD-10-CM | POA: Diagnosis not present

## 2020-05-22 DIAGNOSIS — E1122 Type 2 diabetes mellitus with diabetic chronic kidney disease: Secondary | ICD-10-CM | POA: Diagnosis not present

## 2020-05-22 DIAGNOSIS — E559 Vitamin D deficiency, unspecified: Secondary | ICD-10-CM | POA: Diagnosis not present

## 2020-05-22 DIAGNOSIS — Z8249 Family history of ischemic heart disease and other diseases of the circulatory system: Secondary | ICD-10-CM | POA: Diagnosis not present

## 2020-05-22 DIAGNOSIS — Z1211 Encounter for screening for malignant neoplasm of colon: Secondary | ICD-10-CM

## 2020-05-22 DIAGNOSIS — E785 Hyperlipidemia, unspecified: Secondary | ICD-10-CM | POA: Diagnosis not present

## 2020-05-22 DIAGNOSIS — N182 Chronic kidney disease, stage 2 (mild): Secondary | ICD-10-CM | POA: Diagnosis not present

## 2020-05-22 DIAGNOSIS — E1169 Type 2 diabetes mellitus with other specified complication: Secondary | ICD-10-CM

## 2020-05-22 DIAGNOSIS — Z1212 Encounter for screening for malignant neoplasm of rectum: Secondary | ICD-10-CM

## 2020-05-22 DIAGNOSIS — Z8546 Personal history of malignant neoplasm of prostate: Secondary | ICD-10-CM

## 2020-05-22 NOTE — Patient Instructions (Signed)

## 2020-05-22 NOTE — Progress Notes (Signed)
Comprehensive Evaluation & Examination      This very nice 70 y.o. MBM presents for a  comprehensive evaluation and management of multiple medical co-morbidities.  Patient has been followed for HTN, HLD, T2_NIDDM  and Vitamin D Deficiency. Patient is on Allopurinol for his Gout.      Patient had Robotic Prostatectomy for Prostate Cancer in 2008. He had a penile pump planted in 2016. In 2020, patient had PCNL by Dr Karsten Ro for multiple Rt Renal calculi      HTN predates since 1996. Patient's BP has been controlled at home.  Today's BP: 120/76. Patient denies any cardiac symptoms as chest pain, palpitations, shortness of breath, dizziness or ankle swelling.      Patient's hyperlipidemia is controlled with diet and medications. Patient denies myalgias or other medication SE's. 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 Last A1c was not at goal:   Lab Results  Component Value Date   HGBA1C 6.6 (H) 05/22/2020         Finally, patient has history of Vitamin D Deficiency ("11" /2008) and last vitamin D was still low (goal 70-100):   Lab Results  Component Value Date   VD25OH 51 05/22/2020    Current Outpatient Medications on File Prior to Visit  Medication Sig  . allopurinol  300 MG tablet Take 1 tablet Daily   . aspirin 81 MG tablet Take  daily.  . bisoprolol-hctz 5-6.25 MG tablet Take 1 tablet Daily for BP  . diclofenac  1 % GEL Apply 4 times daily.  Marland Kitchen FLONASE nasal spray Place 2 sprays into both nostrils daily as needed   . metFORMIN 500 MG tablet TAKE 2 TABLETS TWICE A DAY   . Multiple Vitamins-Minerals  Take 1 tablet by mouth daily.  . Omega-3 FISH OIL 1000 MG  Take daily.   .  phentermine (ADIPEX-P) 37.5 MG tablet Take 1/2-1 tab daily   . rosuvastatin (CRESTOR) 5 MG tablet Take 1 tab 3 nights a week for cholesterol.  . terbinafine 250 MG tablet Take 1 tablet Daily for Toenail Fungus  . valsartan  320 MG tablet Take 1 tablet Daily for BP  . VITAMIN D 5,000 Units Take daily.  Bonne Dolores  Take 1 Package daily.     Allergies  Allergen Reactions  . Cialis [Tadalafil] Other (See Comments)    Hot flashes  . Fructose Nausea And Vomiting  . Morphine And Related Itching    Past Medical History:  Diagnosis Date  . Asthma    as a child  . Cancer [facility])    prostate  . ED (erectile dysfunction) of organic origin   . Gout   . History of colon polyps   . History of kidney stones   . History of positive PPD    06/ 2006--  CXR NORMAL  . History of prostate cancer    DEC 2008--  S/P  RADICAL PROSTATECTOMY  . Hyperlipidemia   . Hypertension   . Hypogonadism male   . Mixed hyperlipidemia   . Nocturia   . Sigmoid diverticulosis    MILD  . Type 2 diabetes mellitus ([place])  Health Maintenance  Topic Date Due  . FOOT EXAM  05/02/2020  . OPHTHALMOLOGY EXAM  05/27/2020  . COLONOSCOPY 10/27/2020  . HEMOGLOBIN A1C  11/19/2020  . TETANUS/TDAP  06/28/2021  . INFLUENZA VACCINE  Completed  . COVID-19 Vaccine  Completed  . Hepatitis C Screening  Completed  . PNA vac Low Risk Adult  Completed   Last Colon - 10/28/2010 - Dr Deatra Ina - Recc 10 yr f/u - due July 2022.   Past Surgical History:  Procedure Laterality Date  . ACHILLES TENDON REPAIR Right 1989  . COLONOSCOPY N/A last one 10-28-2010    due 10 yr f/u in 2022 - [facility]  . FINGER AMPUTATION  age 72   left [place]  . HOLMIUM LASER APPLICATION Right 10/10/7626   Procedure: HOLMIUM LASER APPLICATION;  Surgeon: Kathie Rhodes, MD;  Location: [facility];  Service: Urology;  Laterality: Right;  . IR URETERAL STENT RIGHT NEW ACCESS W/O SEP NEPHROSTOMY CATH  02/05/2019  . NEGATIVE SLEEP STUDY  2010  per pt  .  NEPHROLITHOTOMY Right 02/05/2019   Procedure: NEPHROLITHOTOMY PERCUTANEOUS, STENT PLACEMENT;  Surgeon: Kathie Rhodes, MD;  Location: [facility];  Service: Urology;  Laterality: Right;  . PENILE PROSTHESIS IMPLANT  10/28/2014   IMPLANTATION 3 PIECE PENILE INFLATABLE PROTHESIS Kathie Rhodes, MD;   . PERCUTANEOUS NEPHROLITHOTRIPSY Left   . ROBOT ASSISTED LAP RADICAL PROSTATECTOMY  Dec 2008  . SHOULDER OPEN ROTATOR CUFF REPAIR Right 05-11-2006  . URETEROLITHOTOMY  2002   Family History  Problem Relation Age of Onset  . Heart disease Mother   . Diabetes Mother   . Arthritis Mother   . Alcohol abuse Father   . Hypertension Father    Social History   Socioeconomic History  . Marital status: Married    Spouse name: Vaughan Basta & has Renal Transplant  . Number of children: Not on file  Occupational History  .   Tobacco Use  . Smoking status: Never Smoker  . Smokeless tobacco: Never Used  Vaping Use  . Vaping Use: Never used  Substance and Sexual Activity  . Alcohol use: No  . Drug use: No  . Sexual activity: Not Currently   ROS Constitutional: Denies fever, chills, weight loss/gain, headaches, insomnia,  night sweats or change in appetite. Does c/o fatigue. Eyes: Denies redness, blurred vision, diplopia, discharge, itchy or watery eyes.  ENT: Denies discharge, congestion, post nasal drip, epistaxis, sore throat, earache, hearing loss, dental pain, Tinnitus, Vertigo, Sinus pain or snoring.  Cardio: Denies chest pain, palpitations, irregular heartbeat, syncope, dyspnea, diaphoresis, orthopnea, PND, claudication or edema Respiratory: denies cough, dyspnea, DOE, pleurisy, hoarseness, laryngitis or wheezing.  Gastrointestinal: Denies dysphagia, heartburn, reflux, water brash, pain, cramps, nausea, vomiting, bloating, diarrhea, constipation, hematemesis, melena, hematochezia, jaundice or hemorrhoids Genitourinary: Denies dysuria, frequency, urgency, nocturia, hesitancy, discharge, hematuria or flank  pain Musculoskeletal: Denies arthralgia, myalgia, stiffness, Jt. Swelling, pain, limp or strain/sprain. Denies Falls. Skin: Denies puritis, rash, hives, warts, acne, eczema or change in skin lesion Neuro: No weakness, tremor, incoordination, spasms, paresthesia or pain Psychiatric: Denies confusion, memory loss or sensory loss. Denies Depression. Endocrine: Denies change in weight, skin, hair change, nocturia, and paresthesia, diabetic polys, visual blurring or hyper / hypo glycemic episodes.  Heme/Lymph: No excessive bleeding, bruising or enlarged lymph nodes.  Physical Exam  BP 120/76   Pulse (!) 57   Temp (!) 96.1 F (35.6 C)   Resp 16   Ht 5\' 5"  (1.651 m)   Wt 192 lb 6.4 oz (87.3  kg)   SpO2 97%   BMI 32.02 kg/m   General Appearance: Well nourished and well groomed and in no apparent distress.  Eyes: PERRLA, EOMs, conjunctiva no swelling or erythema, normal fundi and vessels. Sinuses: No frontal/maxillary tenderness ENT/Mouth: EACs patent / TMs  nl. Nares clear without erythema, swelling, mucoid exudates. Oral hygiene is good. No erythema, swelling, or exudate. Tongue normal, non-obstructing. Tonsils not swollen or erythematous. Hearing normal.  Neck: Supple, thyroid not palpable. No bruits, nodes or JVD. Respiratory: Respiratory effort normal.  BS equal and clear bilateral without rales, rhonci, wheezing or stridor. Cardio: Heart sounds are normal with regular rate and rhythm and no murmurs, rubs or gallops. Peripheral pulses are normal and equal bilaterally without edema. No aortic or femoral bruits. Chest: symmetric with normal excursions and percussion.  Abdomen: Soft, with Nl bowel sounds. Nontender, no guarding, rebound, hernias, masses, or organomegaly.  Lymphatics: Non tender without lymphadenopathy.  Musculoskeletal: Full ROM all peripheral extremities, joint stability, 5/5 strength, and normal gait. Skin: Warm and dry without rashes, lesions, cyanosis, clubbing or   ecchymosis.  Neuro: Cranial nerves intact, reflexes equal bilaterally. Normal muscle tone, no cerebellar symptoms. Sensation intact.  Pysch: Alert and oriented X 3 with normal affect, insight and judgment appropriate.   Assessment and Plan  1. Essential hypertension  - EKG 12-Lead - [place], RETROPERITNL ABD,  LTD - CBC with Differential/Platelet - COMPLETE METABOLIC PANEL WITH GFR - Magnesium - TSH  2. Hyperlipidemia associated with type 2 diabetes mellitus ([place])  - EKG 12-Lead - [place], RETROPERITNL ABD,  LTD - Lipid panel - TSH  3. Type 2 diabetes mellitus with stage 2 chronic kidney  disease, without long-term current use of insulin ([name])  - EKG 12-Lead - [place], RETROPERITNL ABD,  LTD - Hemoglobin A1c - Insulin, random  4. Vitamin D deficiency  - VITAMIN D 25 Hydroxy  5. History of prostate cancer  - PSA  6. Gout  - Uric acid  7. Screening for colorectal cancer  - POC Hemoccult Bld/Stl   8. Prostate cancer screening  - PSA  9. Screening for ischemic heart disease  - EKG 12-Lead  10. FHx: heart disease  - EKG 12-Lead - [place], RETROPERITNL ABD,  LTD  11. Screening for AAA (aortic abdominal aneurysm)  - [place], RETROPERITNL ABD,  LTD  12. Medication management  - Urinalysis, Routine w reflex microscopic - Microalbumin / creatinine urine ratio - Uric acid - CBC with Differential/Platelet - COMPLETE METABOLIC PANEL WITH GFR - Magnesium - Lipid panel - TSH - Hemoglobin A1c - Insulin, random - VITAMIN D 25 Hydroxy          Patient was counseled in prudent diet, weight control to achieve/maintain BMI less than 25, BP monitoring, regular exercise and medications as discussed.  Discussed med effects and SE's. Routine screening labs and tests as requested with regular follow-up as recommended. Over 40 minutes of exam, counseling, chart review and high complex critical decision making was performed   Kirtland Bouchard, MD

## 2020-05-23 LAB — CBC WITH DIFFERENTIAL/PLATELET
Absolute Monocytes: 380 cells/uL (ref 200–950)
Basophils Absolute: 44 cells/uL (ref 0–200)
Basophils Relative: 0.6 %
Eosinophils Absolute: 241 cells/uL (ref 15–500)
Eosinophils Relative: 3.3 %
HCT: 45.2 % (ref 38.5–50.0)
Hemoglobin: 15.4 g/dL (ref 13.2–17.1)
Lymphs Abs: 2971 cells/uL (ref 850–3900)
MCH: 30.3 pg (ref 27.0–33.0)
MCHC: 34.1 g/dL (ref 32.0–36.0)
MCV: 88.8 fL (ref 80.0–100.0)
MPV: 9.3 fL (ref 7.5–12.5)
Monocytes Relative: 5.2 %
Neutro Abs: 3665 cells/uL (ref 1500–7800)
Neutrophils Relative %: 50.2 %
Platelets: 252 10*3/uL (ref 140–400)
RBC: 5.09 10*6/uL (ref 4.20–5.80)
RDW: 13.2 % (ref 11.0–15.0)
Total Lymphocyte: 40.7 %
WBC: 7.3 10*3/uL (ref 3.8–10.8)

## 2020-05-23 LAB — INSULIN, RANDOM: Insulin: 36.7 u[IU]/mL — ABNORMAL HIGH

## 2020-05-23 LAB — COMPLETE METABOLIC PANEL WITH GFR
AG Ratio: 1.5 (calc) (ref 1.0–2.5)
ALT: 25 U/L (ref 9–46)
AST: 24 U/L (ref 10–35)
Albumin: 4.4 g/dL (ref 3.6–5.1)
Alkaline phosphatase (APISO): 78 U/L (ref 35–144)
BUN: 17 mg/dL (ref 7–25)
CO2: 27 mmol/L (ref 20–32)
Calcium: 9.8 mg/dL (ref 8.6–10.3)
Chloride: 105 mmol/L (ref 98–110)
Creat: 1.12 mg/dL (ref 0.70–1.18)
GFR, Est African American: 76 mL/min/{1.73_m2} (ref >=60)
GFR, Est Non African American: 66 mL/min/{1.73_m2} (ref >=60)
Globulin: 2.9 g/dL (calc) (ref 1.9–3.7)
Glucose, Bld: 121 mg/dL — ABNORMAL HIGH (ref 65–99)
Potassium: 4.4 mmol/L (ref 3.5–5.3)
Sodium: 139 mmol/L (ref 135–146)
Total Bilirubin: 0.3 mg/dL (ref 0.2–1.2)
Total Protein: 7.3 g/dL (ref 6.1–8.1)

## 2020-05-23 LAB — URINALYSIS, ROUTINE W REFLEX MICROSCOPIC
Bacteria, UA: NONE SEEN /HPF
Bilirubin Urine: NEGATIVE
Glucose, UA: NEGATIVE
Hgb urine dipstick: NEGATIVE
Hyaline Cast: NONE SEEN /LPF
Ketones, ur: NEGATIVE
Nitrite: NEGATIVE
Protein, ur: NEGATIVE
RBC / HPF: NONE SEEN /HPF (ref 0–2)
Specific Gravity, Urine: 1.017 (ref 1.001–1.03)
Squamous Epithelial / HPF: NONE SEEN /HPF (ref <=5)
pH: 5.5 (ref 5.0–8.0)

## 2020-05-23 LAB — HEMOGLOBIN A1C
Hgb A1c MFr Bld: 6.6 % of total Hgb — ABNORMAL HIGH (ref <5.7)
Mean Plasma Glucose: 143 mg/dL
eAG (mmol/L): 7.9 mmol/L

## 2020-05-23 LAB — MICROALBUMIN / CREATININE URINE RATIO
Creatinine, Urine: 125 mg/dL (ref 20–320)
Microalb Creat Ratio: 32 mcg/mg creat — ABNORMAL HIGH (ref <30)
Microalb, Ur: 4 mg/dL

## 2020-05-23 LAB — URIC ACID: Uric Acid, Serum: 5.9 mg/dL (ref 4.0–8.0)

## 2020-05-23 LAB — PSA: PSA: <0.04 ng/mL (ref <=4.0)

## 2020-05-23 LAB — LIPID PANEL
Cholesterol: 146 mg/dL (ref <200)
HDL: 34 mg/dL — ABNORMAL LOW (ref >=40)
LDL Cholesterol (Calc): 89 mg/dL (calc)
Non-HDL Cholesterol (Calc): 112 mg/dL (calc) (ref <130)
Total CHOL/HDL Ratio: 4.3 (calc) (ref <5.0)
Triglycerides: 136 mg/dL (ref <150)

## 2020-05-23 LAB — TSH: TSH: 1.09 mIU/L (ref 0.40–4.50)

## 2020-05-23 LAB — MAGNESIUM: Magnesium: 2 mg/dL (ref 1.5–2.5)

## 2020-05-23 LAB — VITAMIN D 25 HYDROXY (VIT D DEFICIENCY, FRACTURES): Vit D, 25-Hydroxy: 51 ng/mL (ref 30–100)

## 2020-05-23 NOTE — Progress Notes (Signed)
========================================================== ==========================================================  -    PSA - Undetectable - Great   ========================================================== ==========================================================  -  Uric Acid / Gout test - Normal  - Please continue Allopurinol same ========================================================== ==========================================================  -  Total Chol = 146  and LDL 89 - Both excellent ========================================================== ==========================================================  -  A1c = 66% - Your blood sugar and A1c are elevated.    Being diabetic has a  300% increased risk for heart attack,  stroke, cancer, and alzheimer- type vascular dementia.   It is very important that you work harder with diet by  avoiding all foods that are white except chicken,   fish & calliflower.  - Avoid white rice  (brown & wild rice is OK),   - Avoid white potatoes  (sweet potatoes in moderation is OK),   White bread or wheat bread or anything made out of   white flour like bagels, donuts, rolls, buns, biscuits, cakes,  - pastries, cookies, pizza crust, and pasta (made from  white flour & egg whites)   - vegetarian pasta or spinach or wheat pasta is OK.  - Multigrain breads like Arnold's, Pepperidge Farm or   multigrain sandwich thins or high fiber breads like   Eureka bread or "Dave's Killer" breads that are  4 to 5 grams fiber per slice !  are best.   ========================================================== ==========================================================  -  Vitamin D = 51  - sl low   (Ideal or Goal is between 70-100)  - So. . . . . Recommend increase your Vit D 5,000 unit cap up to                                                                             2 caps = 10,000 units  /day   ========================================================== ==========================================================  -  All Else - CBC - Kidneys - Electrolytes - Liver - Magnesium & Thyroid    - all  Normal / OK ===========================================================

## 2020-05-24 ENCOUNTER — Encounter: Payer: Self-pay | Admitting: Internal Medicine

## 2020-06-28 IMAGING — CT CT ABDOMEN W/O CM
2 of 4 series · 15 of 46 positions shown, 17 images · non-contrast
Comparison: 04/09/2010 from [HOSPITAL]

CLINICAL DATA: Urolithiasis. Status post right percutaneous
nephrolithotomy.

EXAM:
CT ABDOMEN WITHOUT CONTRAST
TECHNIQUE: Multidetector CT imaging of the abdomen was performed following the
standard protocol without IV contrast.

[Series 2: axial st · axial · 0.72mm/px · z∈[-404,-184]mm · 12 of 53 slices shown, 14 images]
[im 5/53  soft-tissue]
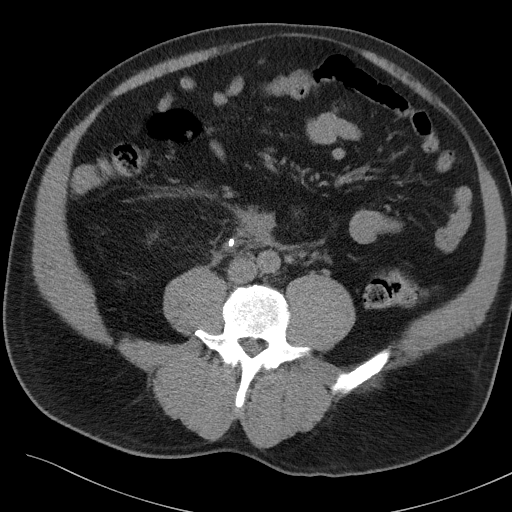
[im 5/53  bone]
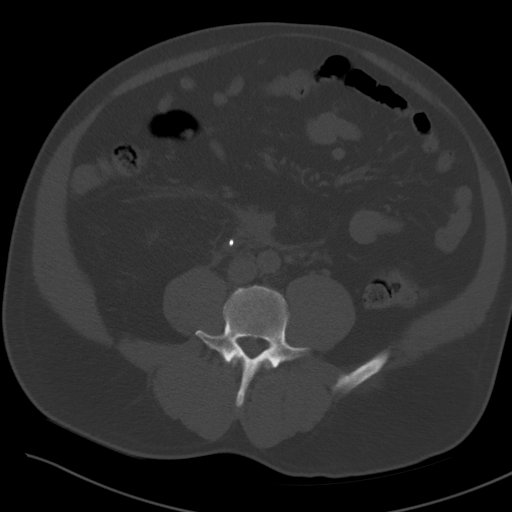
[im 9/53  soft-tissue]
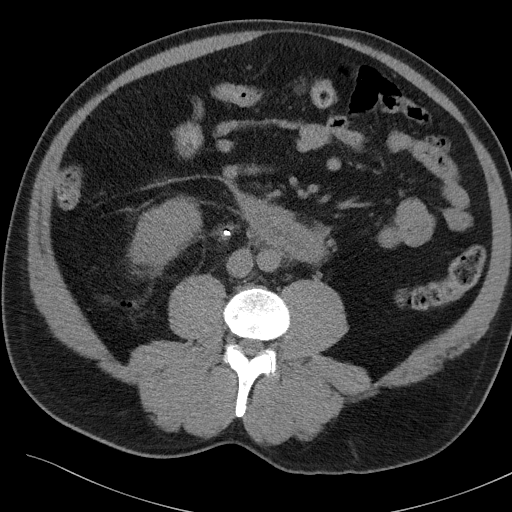
[im 13/53  soft-tissue]
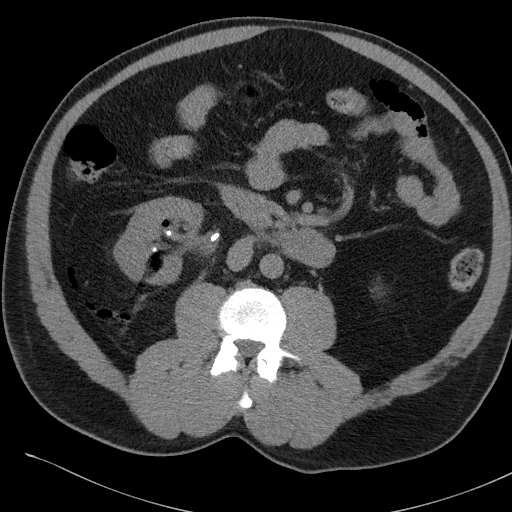
[im 17/53  soft-tissue]
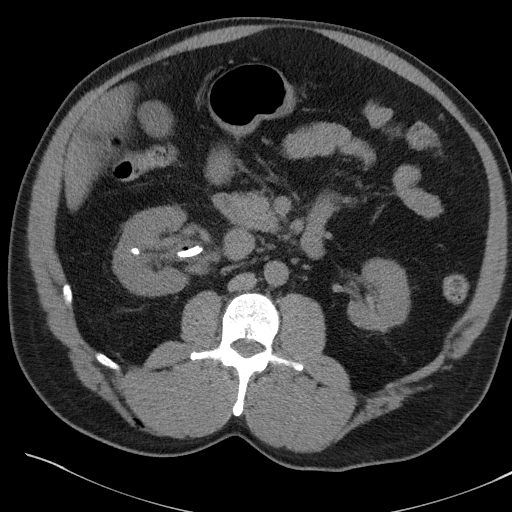
[im 21/53  soft-tissue]
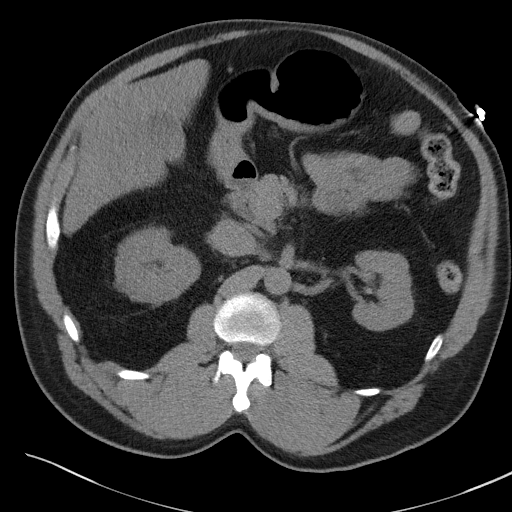
[im 25/53  soft-tissue]
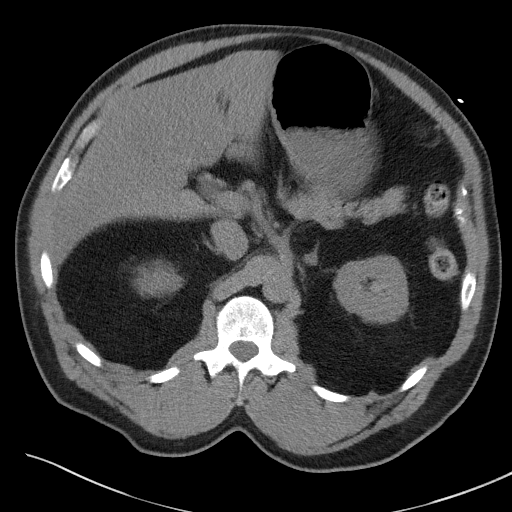
[im 29/53  soft-tissue]
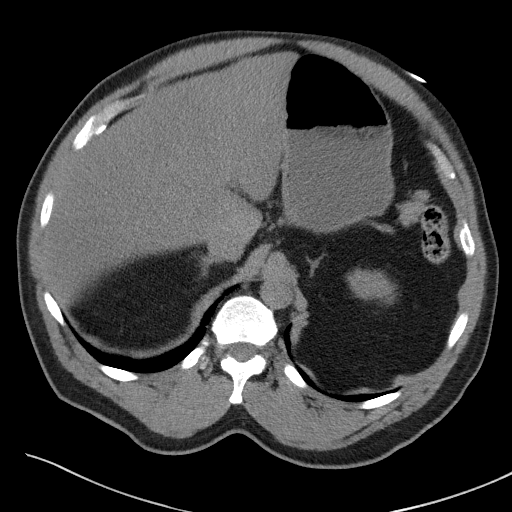
[im 33/53  soft-tissue]
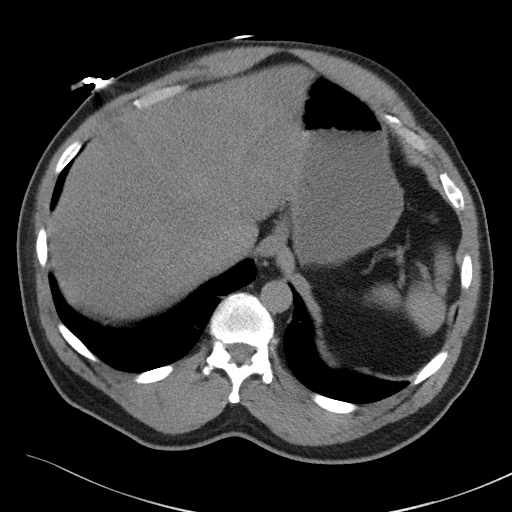
[im 37/53  soft-tissue]
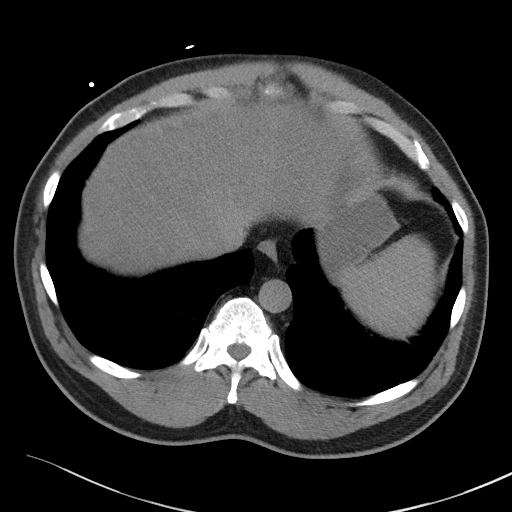
[im 37/53  bone]
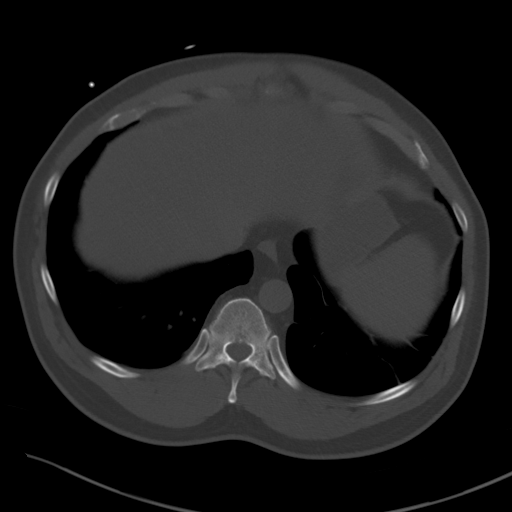
[im 41/53  soft-tissue]
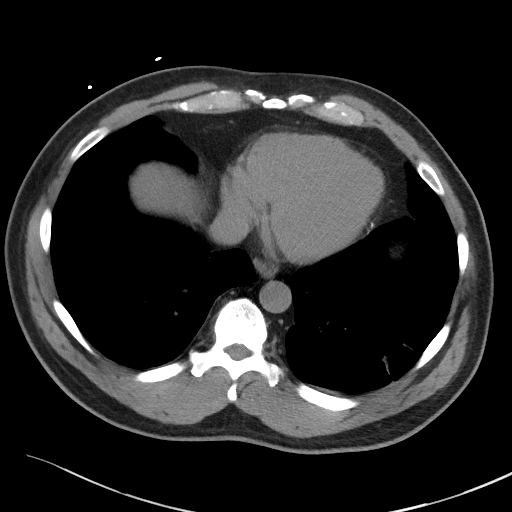
[im 45/53  soft-tissue]
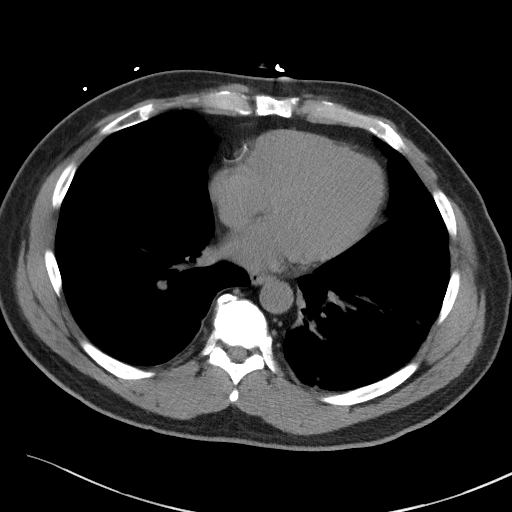
[im 49/53  soft-tissue]
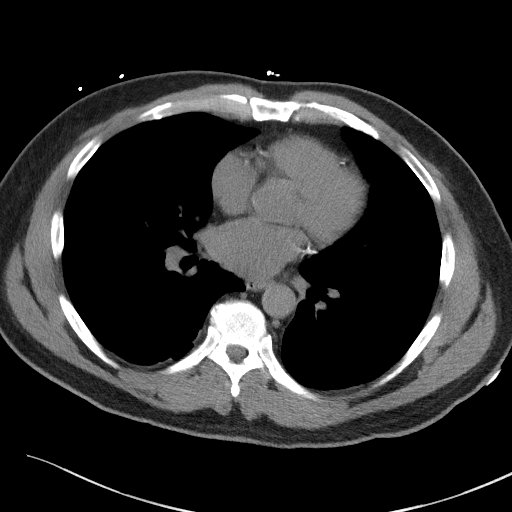

[Series 5: coronal st · coronal · 0.57mm/px · 3 of 101 slices shown]
[im 34/101  soft-tissue]
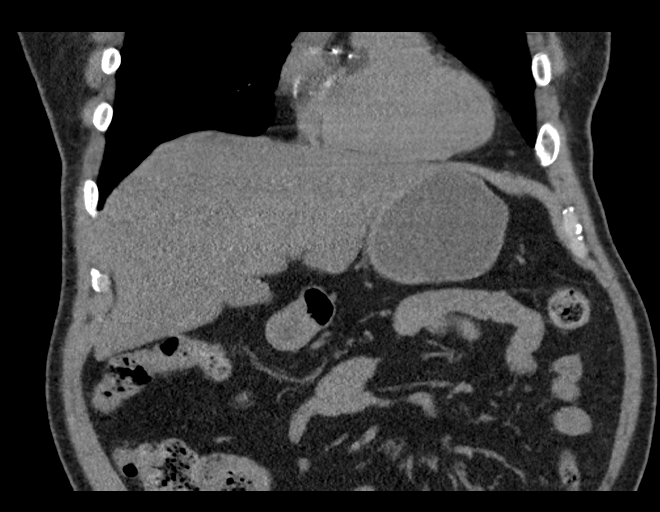
[im 45/101  soft-tissue]
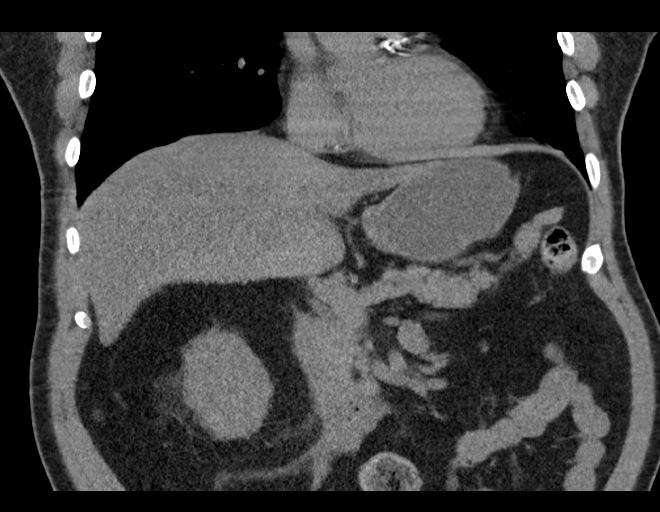
[im 56/101  soft-tissue]
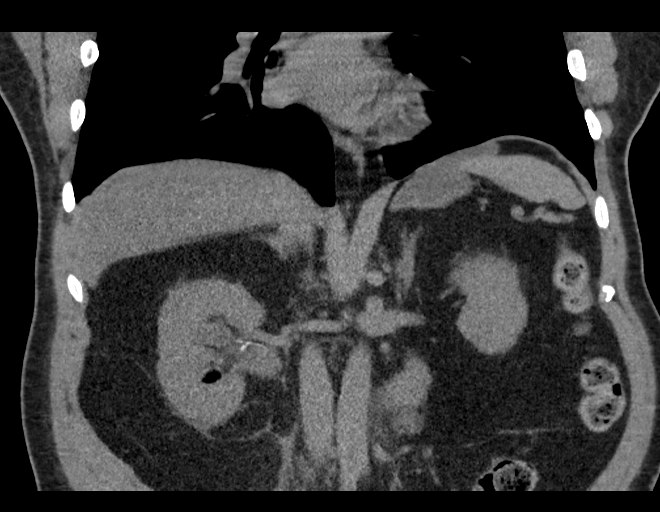

[15 of 46 positions shown; findings below may reference images not displayed]

FINDINGS: Lower chest: No acute findings.

Hepatobiliary: No masses visualized on this unenhanced exam. Mild
diffuse hepatic steatosis noted. Gallbladder is unremarkable. No
evidence of biliary ductal dilatation.

Pancreas: No mass or inflammatory process visualized on this
unenhanced exam.

Spleen:  Within normal limits in size.

Adrenals/Urinary tract: Right ureteral stent in appropriate
position. No evidence of hydronephrosis. Several small residual
right renal calculi are seen in the mid and lower poles, largest
measuring 5 mm. Small amount of gas seen in the right perinephric
space consistent with recent percutaneous nephrolithotomy. No
evidence of perinephric hematoma or other fluid collection. No
evidence of left renal calculi or hydronephrosis.

Stomach/Bowel: Visualized portion unremarkable.

Vascular/Lymphatic: No pathologically enlarged lymph nodes
identified. No evidence of abdominal aortic aneurysm.

Other:  None.

Musculoskeletal:  No suspicious bone lesions identified.
IMPRESSION: Right ureteral stent in appropriate position. No evidence of
hydronephrosis.

Small residual right renal calculi, largest measuring 5 mm.

Mild hepatic steatosis.

## 2020-09-03 ENCOUNTER — Ambulatory Visit: Payer: Medicare PPO | Admitting: Adult Health

## 2020-09-11 ENCOUNTER — Other Ambulatory Visit: Payer: Self-pay

## 2020-09-11 ENCOUNTER — Ambulatory Visit (INDEPENDENT_AMBULATORY_CARE_PROVIDER_SITE_OTHER): Payer: Medicare PPO | Admitting: Adult Health

## 2020-09-11 ENCOUNTER — Encounter: Payer: Self-pay | Admitting: Adult Health

## 2020-09-11 VITALS — BP 122/70 | HR 57 | Temp 96.6°F | Wt 197.0 lb

## 2020-09-11 DIAGNOSIS — I2584 Coronary atherosclerosis due to calcified coronary lesion: Secondary | ICD-10-CM

## 2020-09-11 DIAGNOSIS — E1122 Type 2 diabetes mellitus with diabetic chronic kidney disease: Secondary | ICD-10-CM | POA: Diagnosis not present

## 2020-09-11 DIAGNOSIS — I2510 Atherosclerotic heart disease of native coronary artery without angina pectoris: Secondary | ICD-10-CM

## 2020-09-11 DIAGNOSIS — R6889 Other general symptoms and signs: Secondary | ICD-10-CM | POA: Diagnosis not present

## 2020-09-11 DIAGNOSIS — N182 Chronic kidney disease, stage 2 (mild): Secondary | ICD-10-CM | POA: Diagnosis not present

## 2020-09-11 DIAGNOSIS — Z0000 Encounter for general adult medical examination without abnormal findings: Secondary | ICD-10-CM

## 2020-09-11 DIAGNOSIS — I10 Essential (primary) hypertension: Secondary | ICD-10-CM

## 2020-09-11 DIAGNOSIS — Z0001 Encounter for general adult medical examination with abnormal findings: Secondary | ICD-10-CM

## 2020-09-11 DIAGNOSIS — Z79899 Other long term (current) drug therapy: Secondary | ICD-10-CM

## 2020-09-11 DIAGNOSIS — E669 Obesity, unspecified: Secondary | ICD-10-CM

## 2020-09-11 DIAGNOSIS — S68119S Complete traumatic metacarpophalangeal amputation of unspecified finger, sequela: Secondary | ICD-10-CM

## 2020-09-11 DIAGNOSIS — F411 Generalized anxiety disorder: Secondary | ICD-10-CM

## 2020-09-11 DIAGNOSIS — N5231 Erectile dysfunction following radical prostatectomy: Secondary | ICD-10-CM

## 2020-09-11 DIAGNOSIS — F325 Major depressive disorder, single episode, in full remission: Secondary | ICD-10-CM

## 2020-09-11 DIAGNOSIS — N200 Calculus of kidney: Secondary | ICD-10-CM | POA: Diagnosis not present

## 2020-09-11 DIAGNOSIS — Z8546 Personal history of malignant neoplasm of prostate: Secondary | ICD-10-CM

## 2020-09-11 DIAGNOSIS — E1169 Type 2 diabetes mellitus with other specified complication: Secondary | ICD-10-CM

## 2020-09-11 DIAGNOSIS — E785 Hyperlipidemia, unspecified: Secondary | ICD-10-CM

## 2020-09-11 DIAGNOSIS — Z1211 Encounter for screening for malignant neoplasm of colon: Secondary | ICD-10-CM

## 2020-09-11 DIAGNOSIS — M1A9XX0 Chronic gout, unspecified, without tophus (tophi): Secondary | ICD-10-CM

## 2020-09-11 DIAGNOSIS — E559 Vitamin D deficiency, unspecified: Secondary | ICD-10-CM

## 2020-09-11 MED ORDER — METFORMIN HCL 500 MG PO TABS: ORAL_TABLET | ORAL | 3 refills | Status: AC

## 2020-09-11 NOTE — Patient Instructions (Addendum)
Ask insurance and pharmacy about shingrix - it is a 2 part shot (2-6 months apart) that prevents 90-95% of shingles  This shot can make you feel bad due to such good immune response it can trigger some inflammation so take tylenol or aleve day of or day after and plan on resting.   Can go to [email or web address] for more information  Shingrix Vaccination  Two vaccines are licensed and recommended to prevent shingles in the [place] Zoster vaccine live (ZVL, Zostavax) has been in use since 2006. Recombinant zoster vaccine (RZV, Shingrix), has been in use since 2017 and is recommended by ACIP as the preferred shingles vaccine.  What Everyone Should Know about Shingles Vaccine (Shingrix) One of the Recommended Vaccines by Disease Shingles vaccination is the only way to protect against shingles and postherpetic neuralgia (PHN), the most common complication from shingles. CDC recommends that healthy adults 50 years and older get two doses of the shingles vaccine called Shingrix (recombinant zoster vaccine), separated by 2 to 6 months, to prevent shingles and the complications from the disease. Your doctor or pharmacist can give you Shingrix as a shot in your upper arm. Shingrix provides strong protection against shingles and PHN. Two doses of Shingrix is more than 90% effective at preventing shingles and PHN. Protection stays above 85% for at least the first four years after you get vaccinated. Shingrix is the preferred vaccine, over Zostavax (zoster vaccine live), a shingles vaccine in use since 2006. Zostavax may still be used to prevent shingles in healthy adults 60 years and older. For example, you could use Zostavax if a person is allergic to Shingrix, prefers Zostavax, or requests immediate vaccination and Shingrix is unavailable. Who Should Get Shingrix? Healthy adults 50 years and older should get two doses of Shingrix, separated by 2 to 6 months.  You should get Shingrix even if in the past you . had shingles  . received Zostavax  . are not sure if you had chickenpox There is no maximum age for getting Shingrix. If you had shingles in the past, you can get Shingrix to help prevent future occurrences of the disease. There is no specific length of time that you need to wait after having shingles before you can receive Shingrix, but generally you should make sure the shingles rash has gone away before getting vaccinated. You can get Shingrix whether or not you remember having had chickenpox in the past. Studies show that more than 99% of Americans 40 years and older have had chickenpox, even if they don't remember having the disease. Chickenpox and shingles are related because they are caused by the same virus (varicella zoster virus). After a person recovers from chickenpox, the virus stays dormant (inactive) in the body. It can reactivate years later and cause shingles. If you had Zostavax in the recent past, you should wait at least eight weeks before getting Shingrix. Talk to your healthcare provider to determine the best time to get Shingrix. Shingrix is available in [employer] and pharmacies. To find doctor's offices or pharmacies near you that offer the vaccine, visit HealthMap Vaccine FinderExternal. If you have questions about Shingrix, talk with your healthcare provider. Vaccine for Those 34 Years and Older  Shingrix reduces the risk of shingles and PHN by more than 90% in people 1 and older. CDC recommends the vaccine for healthy adults 18 and older.  Who Should Not Get Shingrix? You should not get Shingrix if you: . have ever had a severe allergic  reaction to any component of the vaccine or after a dose of Shingrix  . tested negative for immunity to varicella zoster virus. If you test negative, you should get chickenpox vaccine.  . currently have shingles  . currently are pregnant or breastfeeding. Women who are pregnant or  breastfeeding should wait to get Shingrix.  Marland Kitchen receive specific antiviral drugs (acyclovir, famciclovir, or valacyclovir) 24 hours before vaccination (avoid use of these antiviral drugs for 14 days after vaccination)- zoster vaccine live only If you have a minor acute (starts suddenly) illness, such as a cold, you may get Shingrix. But if you have a moderate or severe acute illness, you should usually wait until you recover before getting the vaccine. This includes anyone with a temperature of 101.77F or higher. The side effects of the Shingrix are temporary, and usually last 2 to 3 days. While you may experience pain for a few days after getting Shingrix, the pain will be less severe than having shingles and the complications from the disease. How Well Does Shingrix Work? Two doses of Shingrix provides strong protection against shingles and postherpetic neuralgia (PHN), the most common complication of shingles. . In adults 79 to 72 years old who got two doses, Shingrix was 97% effective in preventing shingles; among adults 70 years and older, Shingrix was 91% effective.  . In adults 66 to 72 years old who got two doses, Shingrix was 91% effective in preventing PHN; among adults 70 years and older, Shingrix was 89% effective. Shingrix protection remained high (more than 85%) in people 70 years and older throughout the four years following vaccination. Since your risk of shingles and PHN increases as you get older, it is important to have strong protection against shingles in your older years. Top of Page  What Are the Possible Side Effects of Shingrix? Studies show that Shingrix is safe. The vaccine helps your body create a strong defense against shingles. As a result, you are likely to have temporary side effects from getting the shots. The side effects may affect your ability to do normal daily activities for 2 to 3 days. Most people got a sore arm with mild or moderate pain after getting Shingrix, and  some also had redness and swelling where they got the shot. Some people felt tired, had muscle pain, a headache, shivering, fever, stomach pain, or nausea. About 1 out of 6 people who got Shingrix experienced side effects that prevented them from doing regular activities. Symptoms went away on their own in about 2 to 3 days. Side effects were more common in younger people. You might have a reaction to the first or second dose of Shingrix, or both doses. If you experience side effects, you may choose to take over-the-counter pain medicine such as ibuprofen or acetaminophen. If you experience side effects from Shingrix, you should report them to the Vaccine Adverse Event Reporting System (VAERS). Your doctor might file this report, or you can do it yourself through the VAERS websiteExternal, or by calling [phone]. If you have any questions about side effects from Shingrix, talk with your doctor. The shingles vaccine does not contain thimerosal (a preservative containing mercury). Top of Page  When Should I See a Doctor Because of the Side Effects I Experience From Shingrix? In clinical trials, Shingrix was not associated with serious adverse events. In fact, serious side effects from vaccines are extremely rare. For example, for every 1 million doses of a vaccine given, only one or two people may have  a severe allergic reaction. Signs of an allergic reaction happen within minutes or hours after vaccination and include hives, swelling of the face and throat, difficulty breathing, a fast heartbeat, dizziness, or weakness. If you experience these or any other life-threatening symptoms, see a doctor right away. Shingrix causes a strong response in your immune system, so it may produce short-term side effects more intense than you are used to from other vaccines. These side effects can be uncomfortable, but they are expected and usually go away on their own in 2 or 3 days. Top of Page  How Can I Pay For  Shingrix? There are several ways shingles vaccine may be paid for: Medicare . Medicare Part D plans cover the shingles vaccine, but there may be a cost to you depending on your plan. There may be a copay for the vaccine, or you may need to pay in full then get reimbursed for a certain amount.  . Medicare Part B does not cover the shingles vaccine. Medicaid . Medicaid may or may not cover the vaccine. Contact your insurer to find out. Private health insurance . Many private health insurance plans will cover the vaccine, but there may be a cost to you depending on your plan. Contact your insurer to find out. Vaccine assistance programs . Some pharmaceutical companies provide vaccines to eligible adults who cannot afford them. You may want to check with the vaccine manufacturer, [employer], about Shingrix. If you do not currently have health insurance, learn more about affordable health coverage optionsExternal. To find doctor's offices or pharmacies near you that offer the vaccine, visit HealthMap Vaccine FinderExternal.

## 2020-09-11 NOTE — Progress Notes (Signed)
MEDICARE ANNUAL WELLNESS VISIT AND OV  Assessment:   Assessment and Plan:   Encounter for Medicare annual wellness exam  1 year  Coronary atherosclerosis due to calcified coronary lesion Control blood pressure, cholesterol, glucose, increase exercise.  Denies angina; LDL goal <70  Essential hypertension - continue medications, DASH diet, exercise and monitor at home. Call if greater than 130/80.  -     CBC with Differential/Platelet -     COMPLETE METABOLIC PANEL WITH GFR -     TSH -     bisoprolol-hydrochlorothiazide (ZIAC) 5-6.25 MG tablet; Take 1 tablet by mouth daily. for blood pressure  Type 2 diabetes mellitus with stage 2 chronic kidney disease, without long-term current use of insulin ([place]) Discussed general issues about diabetes pathophysiology and management., Educational material distributed., Suggested low cholesterol diet., Encouraged aerobic exercise., Discussed foot care., retinal exam report requested -     Foot exam completed -     Hemoglobin A1c  Hyperlipidemia associated with type 2 diabetes mellitus ([name]) check lipids, goal less than 70- discussed statin, stop zetia, plan to increase rosuvastatin 5 mg if needed for goal decrease fatty foods, increase activity.  -     Lipid panel -     ezetimibe (ZETIA) 10 MG tablet; Take 1 tablet (10 mg total) by mouth daily.  CKD stage 2 due to type 2 diabetes mellitus ([name]) Increase fluids, avoid NSAIDS, monitor sugars, will monitor  Chronic gout involving toe without tophus, unspecified cause, unspecified laterality Gout- recheck Uric acid as needed, Diet discussed, continue medications.  Nephrolithiasis Push fluids, monitor  Medication management -     Magnesium  Vitamin D deficiency Continue supplement  Hx of prostate cancer; Erectile dysfunction following radical prostatectomy Follow up urology; checking PSA annually here  Depression, in full remission ([name])/ anxiety  Denies sx; full remission off of  medication, monitor stress management techniques discussed, increase water, good sleep hygiene discussed, increase exercise, and increase veggies.   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 Further disposition pending results of labs. Discussed med's effects and SE's.    Over 30 minutes of exam, counseling, chart review, and critical decision making was performed  Future Appointments  Date Time Provider [place]  12/08/2020 10:30 AM [name], MD [facility] None  06/02/2021 11:00 AM [name], MD [facility] None     Plan:   During the course of the visit the patient was educated and counseled about appropriate screening and preventive services including:    Pneumococcal vaccine   Influenza vaccine  Td vaccine  Screening electrocardiogram  Bone densitometry screening  Colorectal cancer screening  Diabetes screening  Glaucoma screening  Nutrition counseling   Advanced directives: requested   HPI 70 y.o.  Cody Zamora  presents for 3 month follow up on hypertension, cholesterol, diabetes and vitamin D deficiency and wellness visit.   Wife is Cody Zamora, s/p kidney transplant, also taking care of her 44 y/o aunt with myeloma, she is stable/plateaued, some challenges.   He had nephrolithotomy Oct 2020 with Dr. Karsten Ro due to pain after it was postponed due to pandemic. Hx of prostate cancer with prostatectomy in 2008, with ED. [facility]. Last PSA here was <0.04 in  05/2020  Depression in remission off of medications.   BMI is Body mass index is 32.78 kg/m. he is working on diet and exercise. He has been prescribed phentermine but admits never picked up. He states he just needs  to stop soda, bread and walk. He has been doing water aerobics.  Wt Readings from Last 3 Encounters:  09/11/20 197 lb (89.4 kg)  05/22/20 192 lb 6.4 oz (87.3 kg)  02/18/20 194 lb (88 kg)    His blood pressure has been controlled at home, today his BP is BP: 122/70.  He does workout. He denies chest pain, shortness of breath, dizziness.   He is on cholesterol medication (zeita 10 mg daily, also taking rosuvastatin 5 mg three days, no hx of myalgia, rosuvastatin is new) and denies myalgias. His cholesterol is at goal. The cholesterol was:   Lab Results  Component Value Date   CHOL 146 05/22/2020   HDL 34 (L) 05/22/2020   LDLCALC 89 05/22/2020   TRIG 136 05/22/2020   CHOLHDL 4.3 05/22/2020    He has been working on diet and exercise for diabetes with diabetic chronic kidney disease. Taking metformin 500 mg three times daily.  Hyperlipidemia on rosuvastatin  he is on bASA denies  paresthesia of the feet, polydipsia, polyuria and visual disturbances.  He checks occasionally fasting, reports around 110s-120s - having trouble with meter, requests new Last A1C was:   Lab Results  Component Value Date   HGBA1C 6.6 (H) 05/22/2020   He has CKD II associated with T2DM on ACEi. Last GFR:  Lab Results  Component Value Date   GFRAA 76 05/22/2020    Patient is on Vitamin D supplement. Lab Results  Component Value Date   VD25OH 51 05/22/2020   Patient is on allopurinol for gout and does not report a recent flare.  Lab Results  Component Value Date   LABURIC 5.9 05/22/2020     Current Medications:  Current Outpatient Medications on File Prior to Visit  Medication Sig Dispense Refill  . allopurinol (ZYLOPRIM) 300 MG tablet Take 1 tablet Daily to Prevent Gout 90 tablet 3  . aspirin 81 MG tablet Take 81 mg by mouth daily.    . bisoprolol-hydrochlorothiazide (ZIAC) 5-6.25 MG tablet Take 1 tablet Daily for BP 90 tablet 3  . Blood Glucose Monitoring Suppl (FREESTYLE FREEDOM LITE) w/Device  KIT Check blood sugar 1 time daily-DX-E11.22 1 kit 0  . diclofenac Sodium (VOLTAREN) 1 % GEL Apply 1 application topically 4 (four) times daily.    . fluticasone (FLONASE) 50 MCG/ACT nasal spray Place 1 spray into both nostrils daily as needed for allergies. (Patient taking differently: Place 2 sprays into both nostrils daily as needed for allergies.) 20 g 1  . glucose blood (FREESTYLE LITE) test strip Check blood sugar 1 time daily-E11.22 100 each 3  . Lancets (FREESTYLE) lancets Check blood sugar 1 time daily-E11.22 100 each 3  . Multiple Vitamins-Minerals (MULTIVITAMIN WITH MINERALS) tablet Take 1 tablet by mouth daily.    . Omega-3 Fatty Acids (FISH OIL) 1000 MG CAPS Take 1,000 mg by mouth daily.     . rosuvastatin (CRESTOR) 5 MG tablet Take 1 tab 3 nights a week for cholesterol. 36 tablet 3  . terbinafine (LAMISIL) 250 MG tablet Take 1 tablet Daily for Toenail Fungus 90 tablet 1  . valsartan (DIOVAN) 320 MG tablet Take 1 tablet Daily for BP 90 tablet 3  . VITAMIN D PO Take 5,000 Units by mouth daily.    Marland Kitchen  Wheat Dextrin (BENEFIBER DRINK MIX PO) Take 1 Package by mouth daily.     . phentermine (ADIPEX-P) 37.5 MG tablet Take 1/2-1 tab daily as needed for appetite and weight loss. (Patient not taking: Reported on 09/11/2020) 30 tablet 2   No current facility-administered medications on file prior to visit.   Medical History:  Past Medical History:  Diagnosis Date  . Asthma    as a child  . Cancer [facility])    prostate  . ED (erectile dysfunction) of organic origin   . Gout   . History of colon polyps   . History of kidney stones   . History of positive PPD    06/ 2006--  CXR NORMAL  . History of prostate cancer    DEC 2008--  S/P  RADICAL PROSTATECTOMY  . Hyperlipidemia   . Hypertension   . Hypogonadism Zamora   . Mixed hyperlipidemia   . Nocturia   . Sigmoid diverticulosis    MILD  . Type 2 diabetes mellitus ([place])    type 2   Preventative care: Immunization History  Administered  Date(s) Administered  . DTaP 02/17/2002  . Influenza Whole 01/08/2013  . Influenza, High Dose Seasonal PF 02/26/2014, 12/24/2015, 03/21/2017, 04/14/2018, 12/27/2018, 01/29/2020  . PFIZER(Purple Top)SARS-COV-2 Vaccination 05/26/2019, 06/16/2019, 01/29/2020, 08/19/2020  . Pneumococcal Conjugate-13 07/23/2015  . Pneumococcal Polysaccharide-23 02/18/1996, 01/03/2018  . Tdap 06/29/2011  . Zoster, Live 03/23/2011    Last colonoscopy: 10/28/2010 - Dr Deatra Ina - 23 yr f/u.  EGD 2012 DEXA 2011 CXR 2013- never smoked  Tdap 2013 Pneumonia 2019 Prevnar 13: 2017 Influenza 01/2020 Shingles 2012- check with insurance  covid 19: has had 3/3, pfizer - he doesn't have card, will send info  Names of Other Physician/Practitioners you currently use: 1. [facility] here for primary care 2. Dr Frederico Hamman, eye doctor, 2021, report requested, early glaucoma- getting new provider 3. Dr Milford Cage, dentist, last visit 2022    Allergies Allergies  Allergen Reactions  . Cialis [Tadalafil] Other (See Comments)    Hot flashes  . Fructose Nausea And Vomiting  . Morphine And Related Itching    SURGICAL HISTORY He  has a past surgical history that includes Finger amputation (age 50); Robot assisted laparoscopic radical prostatectomy (Dec 2008); Colonoscopy (N/A, last one 10-28-2010); Percutaneous nephrolithotripsy (Left, 04-04-2009// second look 04-15-2009); Ureterolithotomy (2002); Shoulder open rotator cuff repair (Right, 05-11-2006); Achilles tendon repair (Right, 1989); NEGATIVE SLEEP STUDY (2010  per pt); Penile prosthesis implant (N/A, 10/28/2014); IR URETERAL STENT RIGHT NEW ACCESS W/O SEP NEPHROSTOMY CATH (02/05/2019); Nephrolithotomy (Right, 02/05/2019); and Holmium laser application (Right, Cody/[id number]). FAMILY HISTORY His family history includes Alcohol abuse in his father; Arthritis in his mother; Diabetes in his mother; Heart disease in his mother; Hypertension in his  father. SOCIAL HISTORY He  reports that he has never smoked. He has never used smokeless tobacco. He reports that he does not drink alcohol and does not use drugs.  MEDICARE WELLNESS OBJECTIVES: Physical activity: Current Exercise Habits: Structured exercise class, Type of exercise: Other - see comments (water aerobics), Time (Minutes): 60, Frequency (Times/Week): 2, Weekly Exercise (Minutes/Week): 120, Intensity: Mild, Exercise limited by: orthopedic condition(s) Cardiac risk factors: Cardiac Risk Factors include: advanced age (>11mn, >>30women);dyslipidemia;hypertension;Zamora gender;obesity (BMI >30kg/m2);diabetes mellitus Depression/mood screen:   [facility]  Decreased Interest 0  Down, Depressed, Hopeless 0  PHQ - 2 Score 0  Altered sleeping 0  Tired, decreased energy 0  Change in appetite 0  Feeling  bad or failure about yourself  0  Trouble concentrating 0  Moving slowly or fidgety/restless 0  Suicidal thoughts 0  PHQ-9 Score 0  Difficult doing work/chores Not difficult at all    ADLs:  In your present state of health, do you have any difficulty performing the following activities: 09/11/2020 05/24/2020  Hearing? N N  Comment has hearing aids, work ok -  Vision? N N  Difficulty concentrating or making decisions? N N  Walking or climbing stairs? N N  Dressing or bathing? N N  Doing errands, shopping? N N  Some recent data might be hidden     Cognitive Testing  Alert? Yes  Normal Appearance?Yes  Oriented to person? Yes  Place? Yes   Time? Yes  Recall of three objects?  Yes  Can perform simple calculations? Yes  Displays appropriate judgment?Yes  Can read the correct time from a watch face?Yes  EOL planning: Does Patient Have a Medical Advance Directive?: Yes Type of Advance Directive: [place] will Does patient want to make changes to medical advance directive?: No - Patient declined Copy of [place]  in Chart?: No - copy requested  Review of Systems:  Review of Systems  Constitutional: Negative for malaise/fatigue and weight loss.  HENT: Negative for hearing loss and tinnitus.   Eyes: Negative for blurred vision and double vision.  Respiratory: Negative for cough, sputum production, shortness of breath and wheezing.   Cardiovascular: Negative for chest pain, palpitations, orthopnea, claudication, leg swelling and PND.  Gastrointestinal: Negative for abdominal pain, blood in stool, constipation, diarrhea, heartburn, melena, nausea and vomiting.  Genitourinary: Negative.   Musculoskeletal: Negative for falls, joint pain and myalgias.  Skin: Negative for rash.  Neurological: Negative for dizziness, tingling, sensory change, weakness and headaches.  Endo/Heme/Allergies: Negative for polydipsia.  Psychiatric/Behavioral: Negative.  Negative for depression, memory loss, substance abuse and suicidal ideas. The patient is not nervous/anxious and does not have insomnia.   All other systems reviewed and are negative.  Physical Exam: BP 122/70   Pulse (!) 57   Temp (!) 96.6 F (35.9 C)   Wt 197 lb (89.4 kg)   SpO2 96%   BMI 32.78 kg/m  Wt Readings from Last 3 Encounters:  09/11/20 197 lb (89.4 kg)  05/22/20 192 lb 6.4 oz (87.3 kg)  02/18/20 194 lb (88 kg)   General Appearance: Well nourished, in no apparent distress. Eyes: PERRLA, EOMs, conjunctiva no swelling or erythema Sinuses: No Frontal/maxillary tenderness ENT/Mouth: Ext aud canals clear, TMs without erythema, bulging. No erythema, swelling, or exudate on post pharynx.  Tonsils not swollen or erythematous. Hearing decreased but has hearing aids Neck: Supple, thyroid normal.  Respiratory: Respiratory effort normal, BS equal bilaterally without rales, rhonchi, wheezing or stridor.  Cardio: RRR with no MRGs. Brisk peripheral pulses without edema.  Abdomen: Soft, + BS.  Non tender, no guarding, rebound, hernias, masses. Lymphatics:  Non tender without lymphadenopathy.  Musculoskeletal: Full ROM, 5/5 strength, Normal gait.   He has left hand 3rd digit well healed amputation at PIP joint.  Skin: Warm, dry without rashes, lesions, ecchymosis.  Neuro: Cranial nerves intact. No cerebellar symptoms.  Psych: Awake and oriented X 3, normal affect, Insight and Judgment appropriate.   Medicare Attestation I have personally reviewed: The patient's medical and social history Their use of alcohol, tobacco or illicit drugs Their current medications and supplements The patient's functional ability including ADLs,fall risks, home safety risks, cognitive, and hearing and visual impairment  Diet and physical activities Evidence for depression or mood disorders  The patient's weight, height, BMI, and visual acuity have been recorded in the chart.  I have made referrals, counseling, and provided education to the patient based on review of the above and I have provided the patient with a written personalized care plan for preventive services.     Izora Ribas, NP 12:32 PM [facility]

## 2020-09-12 ENCOUNTER — Other Ambulatory Visit: Payer: Self-pay | Admitting: Adult Health

## 2020-09-12 LAB — COMPLETE METABOLIC PANEL WITH GFR
AG Ratio: 1.4 (calc) (ref 1.0–2.5)
ALT: 18 U/L (ref 9–46)
AST: 21 U/L (ref 10–35)
Albumin: 4.6 g/dL (ref 3.6–5.1)
Alkaline phosphatase (APISO): 74 U/L (ref 35–144)
BUN: 16 mg/dL (ref 7–25)
CO2: 29 mmol/L (ref 20–32)
Calcium: 10.3 mg/dL (ref 8.6–10.3)
Chloride: 104 mmol/L (ref 98–110)
Creat: 1.17 mg/dL (ref 0.70–1.18)
GFR, Est African American: 72 mL/min/{1.73_m2} (ref >=60)
GFR, Est Non African American: 62 mL/min/{1.73_m2} (ref >=60)
Globulin: 3.3 g/dL (calc) (ref 1.9–3.7)
Glucose, Bld: 114 mg/dL — ABNORMAL HIGH (ref 65–99)
Potassium: 4.7 mmol/L (ref 3.5–5.3)
Sodium: 140 mmol/L (ref 135–146)
Total Bilirubin: 0.5 mg/dL (ref 0.2–1.2)
Total Protein: 7.9 g/dL (ref 6.1–8.1)

## 2020-09-12 LAB — CBC WITH DIFFERENTIAL/PLATELET
Absolute Monocytes: 472 cells/uL (ref 200–950)
Basophils Absolute: 40 cells/uL (ref 0–200)
Basophils Relative: 0.5 %
Eosinophils Absolute: 184 cells/uL (ref 15–500)
Eosinophils Relative: 2.3 %
HCT: 47.3 % (ref 38.5–50.0)
Hemoglobin: 15.3 g/dL (ref 13.2–17.1)
Lymphs Abs: 3040 cells/uL (ref 850–3900)
MCH: 29.1 pg (ref 27.0–33.0)
MCHC: 32.3 g/dL (ref 32.0–36.0)
MCV: 89.9 fL (ref 80.0–100.0)
MPV: 9.7 fL (ref 7.5–12.5)
Monocytes Relative: 5.9 %
Neutro Abs: 4264 cells/uL (ref 1500–7800)
Neutrophils Relative %: 53.3 %
Platelets: 249 10*3/uL (ref 140–400)
RBC: 5.26 10*6/uL (ref 4.20–5.80)
RDW: 13.3 % (ref 11.0–15.0)
Total Lymphocyte: 38 %
WBC: 8 10*3/uL (ref 3.8–10.8)

## 2020-09-12 LAB — LIPID PANEL
Cholesterol: 116 mg/dL (ref <200)
HDL: 31 mg/dL — ABNORMAL LOW (ref >=40)
LDL Cholesterol (Calc): 65 mg/dL (calc)
Non-HDL Cholesterol (Calc): 85 mg/dL (calc) (ref <130)
Total CHOL/HDL Ratio: 3.7 (calc) (ref <5.0)
Triglycerides: 117 mg/dL (ref <150)

## 2020-09-12 LAB — HEMOGLOBIN A1C
Hgb A1c MFr Bld: 6.9 % of total Hgb — ABNORMAL HIGH (ref <5.7)
Mean Plasma Glucose: 151 mg/dL
eAG (mmol/L): 8.4 mmol/L

## 2020-09-12 LAB — TSH: TSH: 1.09 mIU/L (ref 0.40–4.50)

## 2020-09-12 LAB — MAGNESIUM: Magnesium: 2 mg/dL (ref 1.5–2.5)

## 2020-11-04 ENCOUNTER — Other Ambulatory Visit: Payer: Self-pay | Admitting: Adult Health

## 2020-11-04 DIAGNOSIS — E669 Obesity, unspecified: Secondary | ICD-10-CM

## 2020-11-04 MED ORDER — PHENTERMINE HCL 37.5 MG PO TABS: 37.5000 mg | ORAL_TABLET | Freq: Every day | ORAL | 2 refills | Status: AC

## 2020-11-04 NOTE — Progress Notes (Signed)
Future Appointments  Date Time Provider [place]  12/08/2020 10:30 AM [name], MD [facility] None  06/02/2021 11:00 AM [name], MD [facility] None  09/15/2021 11:00 AM Liane Comber, NP [facility] None    PDMP reviewed for PHENTERMINE refill request.

## 2020-11-17 ENCOUNTER — Encounter: Payer: Self-pay | Admitting: Gastroenterology

## 2020-11-18 ENCOUNTER — Telehealth: Payer: Self-pay

## 2020-11-18 DIAGNOSIS — N182 Chronic kidney disease, stage 2 (mild): Secondary | ICD-10-CM

## 2020-11-18 DIAGNOSIS — E1122 Type 2 diabetes mellitus with diabetic chronic kidney disease: Secondary | ICD-10-CM

## 2020-11-18 MED ORDER — BLOOD GLUCOSE MONITOR KIT: PACK | 0 refills | Status: AC

## 2020-11-18 NOTE — Telephone Encounter (Signed)
Requesting a new prescription for Glucometer and Supplies

## 2020-12-03 ENCOUNTER — Encounter: Payer: Self-pay | Admitting: Gastroenterology

## 2020-12-07 ENCOUNTER — Encounter: Payer: Self-pay | Admitting: Internal Medicine

## 2020-12-07 NOTE — Progress Notes (Signed)
R  E  S  C  H E  D  U L  E  D                                                                                                                                                                                                                    This very nice 70 y.o. BM presents for 6  month follow up with HTN, HLD, T2_NIDDM and Vitamin D Deficiency. Patient has Gout controlled on his meds.       Patient is treated for HTN (1996) & BP has been controlled at home. Today's  . Patient has had no complaints of any cardiac type chest pain, palpitations, dyspnea / orthopnea / PND, dizziness, claudication, or dependent edema.       Hyperlipidemia is controlled with diet & Rosuvastatin 3 x /week. Patient denies myalgias or other med SE's. Last Lipids were at goal:  Lab Results  Component Value Date   CHOL 116 09/11/2020   HDL 31 (L) 09/11/2020   LDLCALC 65 09/11/2020   TRIG 117 09/11/2020   CHOLHDL 3.7 09/11/2020     Also, the patient has history of T2_NIDDM (2011) w/CKD2  (GFR 72) and has had no symptoms of reactive hypoglycemia, diabetic polys, paresthesias or visual blurring.  Last A1c was not at goal:  Lab Results  Component Value Date   HGBA1C 6.9 (H) 09/11/2020        Further, the patient also has history of Vitamin D Deficiency ("11" /2008) and supplements vitamin D without any suspected side-effects. Last vitamin D was still slightly low:   Lab Results  Component Value Date   VD25OH 51 05/22/2020    1. Essential hypertension  - Continue medication, monitor blood pressure at home.  - Continue DASH diet.  Reminder to go to the ER if any CP,  SOB, nausea, dizziness, severe HA, changes vision/speech.   - CBC with Differential/Platelet - COMPLETE METABOLIC PANEL WITH  GFR - Magnesium - TSH  2. Hyperlipidemia associated with type 2 diabetes mellitus ([place])  - Continue diet/meds, exercise,& lifestyle modifications.  - Continue monitor periodic cholesterol/liver & renal functions    - Lipid panel - TSH  3. Type 2 diabetes mellitus with stage 2 chronic kidney  disease, without long-term current use of insulin ([name])  - Continue diet, exercise  - Lifestyle modifications.  - Monitor appropriate labs   - Fructosamine - Insulin, random  4. Vitamin D deficiency  -  Continue supplementation.   - VITAMIN D 25 Hydroxy   5. Gout  - Uric acid  6. Medication management  - CBC with Differential/Platelet - COMPLETE METABOLIC PANEL WITH GFR - Magnesium - Lipid panel - TSH - Fructosamine - Insulin, random - VITAMIN D 25 Hydroxy  - Uric acid

## 2020-12-08 ENCOUNTER — Ambulatory Visit: Payer: Medicare PPO | Admitting: Internal Medicine

## 2020-12-08 DIAGNOSIS — Z79899 Other long term (current) drug therapy: Secondary | ICD-10-CM

## 2020-12-08 DIAGNOSIS — E1169 Type 2 diabetes mellitus with other specified complication: Secondary | ICD-10-CM

## 2020-12-08 DIAGNOSIS — E1122 Type 2 diabetes mellitus with diabetic chronic kidney disease: Secondary | ICD-10-CM

## 2020-12-08 DIAGNOSIS — I10 Essential (primary) hypertension: Secondary | ICD-10-CM

## 2020-12-08 DIAGNOSIS — E559 Vitamin D deficiency, unspecified: Secondary | ICD-10-CM

## 2020-12-08 DIAGNOSIS — M109 Gout, unspecified: Secondary | ICD-10-CM

## 2020-12-10 ENCOUNTER — Other Ambulatory Visit: Payer: Self-pay

## 2020-12-10 ENCOUNTER — Encounter: Payer: Self-pay | Admitting: Internal Medicine

## 2020-12-10 ENCOUNTER — Ambulatory Visit (INDEPENDENT_AMBULATORY_CARE_PROVIDER_SITE_OTHER): Payer: Medicare PPO | Admitting: Internal Medicine

## 2020-12-10 VITALS — BP 118/68 | HR 51 | Temp 97.1°F | Resp 16 | Ht 65.0 in | Wt 194.2 lb

## 2020-12-10 DIAGNOSIS — I10 Essential (primary) hypertension: Secondary | ICD-10-CM

## 2020-12-10 DIAGNOSIS — E559 Vitamin D deficiency, unspecified: Secondary | ICD-10-CM

## 2020-12-10 DIAGNOSIS — Z79899 Other long term (current) drug therapy: Secondary | ICD-10-CM | POA: Diagnosis not present

## 2020-12-10 DIAGNOSIS — E785 Hyperlipidemia, unspecified: Secondary | ICD-10-CM | POA: Diagnosis not present

## 2020-12-10 DIAGNOSIS — M109 Gout, unspecified: Secondary | ICD-10-CM

## 2020-12-10 DIAGNOSIS — E1169 Type 2 diabetes mellitus with other specified complication: Secondary | ICD-10-CM

## 2020-12-10 DIAGNOSIS — E1122 Type 2 diabetes mellitus with diabetic chronic kidney disease: Secondary | ICD-10-CM | POA: Diagnosis not present

## 2020-12-10 DIAGNOSIS — N182 Chronic kidney disease, stage 2 (mild): Secondary | ICD-10-CM | POA: Diagnosis not present

## 2020-12-10 MED ORDER — FREESTYLE FREEDOM LITE W/DEVICE KIT: PACK | 0 refills | Status: AC

## 2020-12-10 NOTE — Patient Instructions (Signed)

## 2020-12-10 NOTE — Progress Notes (Signed)
Future Appointments  Date Time Provider [place]  12/10/2020  2:30 PM [name], MD [facility] None  01/12/2021  2:00 PM Thornton Park, MD [facility]  06/02/2021 11:00 AM [name], MD [facility] None  09/15/2021 11:00 AM Liane Comber, NP [facility] None    History of Present Illness:       This very nice 70 y.o. MBM presents for 6 month follow up with HTN, HLD, Pre-Diabetes and Vitamin D Deficiency.        Patient is treated for HTN & BP has been controlled at home. Today's BP is at goal - 118/68. Patient has had no complaints of any cardiac type chest pain, palpitations, dyspnea / orthopnea / PND, dizziness, claudication, or dependent edema.       Hyperlipidemia is controlled with diet & meds. Patient denies myalgias or other med SE's. Last Lipids were  Lab Results  Component Value Date   CHOL 116 09/11/2020   HDL 31 (L) 09/11/2020   LDLCALC 65 09/11/2020   TRIG 117 09/11/2020   CHOLHDL 3.7 09/11/2020     Also, the patient has history of T2_NIDDM and has had no symptoms of reactive hypoglycemia, diabetic polys, paresthesias or visual blurring.  Last A1c was not at goal:  Lab Results  Component Value Date   HGBA1C 6.9 (H) 09/11/2020        Further, the patient also has history of Vitamin D Deficiency and supplements vitamin D without any suspected side-effects. Last vitamin D was slightly low:   Lab Results  Component Value Date   VD25OH 59 05/22/2020     Current Outpatient Medications on File Prior to Visit  Medication Sig   allopurinol 300 MG tablet Take 1 tablet Daily to Prevent Gout   aspirin 81 MG tablet Take daily.   bisoprolol-hctz 5-6.25 MG tablet Take 1 tablet Daily for BP   diclofenac 1 % GEL Apply 1 application topically 4 (four) times daily.   FLONASE nasal spray Place 1 spray into both nostrils daily as needed for allergies   metFORMIN 500 MG tablet TAKE 1 TABLETS THREE TIMES A DAY    Multiple  Vitamins-Minerals  Take 1 tablet  daily.   rosuvastatin 5 MG tablet Take 1 tab 3 nights a week for cholesterol.   terbinafine 250 MG tablet Take 1 tablet Daily for Toenail Fungus   valsartan (DIOVAN) 320 MG tablet Take 1 tablet Daily for BP   VITAMIN D PO Take 5,000 Units by mouth daily.   BENEFIBER  Take 1 Package by mouth daily.    No current facility-administered medications on file prior to visit.     Allergies  Allergen Reactions   Cialis [Tadalafil] Other (See Comments)    Hot flashes   Fructose Nausea And Vomiting   Morphine And Related Itching     PMHx:   Past Medical History:  Diagnosis Date   Asthma    as a child   Cancer [facility])    prostate   ED (erectile dysfunction) of organic origin    Gout    History of colon polyps    History of kidney stones    History of positive PPD    06/ 2006--  CXR NORMAL   History of prostate cancer    DEC 2008--  S/P  RADICAL PROSTATECTOMY   Hyperlipidemia    Hypertension    Hypogonadism male    Mixed hyperlipidemia    Nocturia    Sigmoid diverticulosis  MILD   Type 2 diabetes mellitus ([place])    type 2     Immunization History  Administered Date(s) Administered   DTaP 02/17/2002   Influenza Whole 01/08/2013   Influenza, High Dose Seasonal PF 02/26/2014, 12/24/2015, 03/21/2017, 04/14/2018, 12/27/2018, 01/29/2020   PFIZER(Purple Top)SARS-COV-2 Vaccination 05/26/2019, 06/16/2019, 01/29/2020, 08/19/2020   Pneumococcal Conjugate-13 07/23/2015   Pneumococcal Polysaccharide-23 02/18/1996, 01/03/2018   Tdap 06/29/2011   Zoster, Live 03/23/2011     Past Surgical History:  Procedure Laterality Date   ACHILLES TENDON REPAIR Right 1989   COLONOSCOPY N/A last one 10-28-2010    due 10 yr f/u in 2022 - [place]  age 79   left Middle   HOLMIUM LASER APPLICATION Right [id number]   Procedure: HOLMIUM LASER APPLICATION;  Surgeon: Kathie Rhodes, MD;  Location: [facility];  Service: Urology;  Laterality: Right;   IR  URETERAL STENT RIGHT NEW ACCESS W/O SEP NEPHROSTOMY CATH  02/05/2019   NEGATIVE SLEEP STUDY  2010  per pt   NEPHROLITHOTOMY Right 02/05/2019   Procedure: NEPHROLITHOTOMY PERCUTANEOUS, STENT PLACEMENT;  Surgeon: Kathie Rhodes, MD;  Location: [facility];  Service: Urology;  Laterality: Right;   PENILE PROSTHESIS IMPLANT N/A 10/28/2014   Procedure: IMPLANTATION 3 PIECE PENILE INFLATABLE PROTHESIS/COLOPLAST SCROTAL APPROACH;  Surgeon: Kathie Rhodes, MD;  Location: [place];  Service: Urology;  Laterality: N/A;   PERCUTANEOUS NEPHROLITHOTRIPSY Left 04-04-2009// second look 04-15-2009   ROBOT ASSISTED LAPAROSCOPIC RADICAL PROSTATECTOMY  Dec 2008   SHOULDER OPEN ROTATOR CUFF REPAIR Right 05-11-2006   URETEROLITHOTOMY  2002    FHx:    Reviewed / unchanged  SHx:    Reviewed / unchanged   Systems Review:  Constitutional: Denies fever, chills, wt changes, headaches, insomnia, fatigue, night sweats, change in appetite. Eyes: Denies redness, blurred vision, diplopia, discharge, itchy, watery eyes.  ENT: Denies discharge, congestion, post nasal drip, epistaxis, sore throat, earache, hearing loss, dental pain, tinnitus, vertigo, sinus pain, snoring.  CV: Denies chest pain, palpitations, irregular heartbeat, syncope, dyspnea, diaphoresis, orthopnea, PND, claudication or edema. Respiratory: denies cough, dyspnea, DOE, pleurisy, hoarseness, laryngitis, wheezing.  Gastrointestinal: Denies dysphagia, odynophagia, heartburn, reflux, water brash, abdominal pain or cramps, nausea, vomiting, bloating, diarrhea, constipation, hematemesis, melena, hematochezia  or hemorrhoids. Genitourinary: Denies dysuria, frequency, urgency, nocturia, hesitancy, discharge, hematuria or flank pain. Musculoskeletal: Denies arthralgias, myalgias, stiffness, jt. swelling, pain, limping or strain/sprain.  Skin: Denies pruritus, rash, hives, warts, acne, eczema or change in skin lesion(s). Neuro: No weakness, tremor,  incoordination, spasms, paresthesia or pain. Psychiatric: Denies confusion, memory loss or sensory loss. Endo: Denies change in weight, skin or hair change.  Heme/Lymph: No excessive bleeding, bruising or enlarged lymph nodes.  Physical Exam  BP 118/68   Pulse (!) 51   Temp (!) 97.1 F (36.2 C)   Resp 16   Ht '5\' 5"'$  (1.651 m)   Wt 194 lb 3.2 oz (88.1 kg)   SpO2 97%   BMI 32.32 kg/m   Appears  well nourished, well groomed  and in no distress.  Eyes: PERRLA, EOMs, conjunctiva no swelling or erythema. Sinuses: No frontal/maxillary tenderness ENT/Mouth: EAC's clear, TM's nl w/o erythema, bulging. Nares clear w/o erythema, swelling, exudates. Oropharynx clear without erythema or exudates. Oral hygiene is good. Tongue normal, non obstructing. Hearing intact.  Neck: Supple. Thyroid not palpable. Car 2+/2+ without bruits, nodes or JVD. Chest: Respirations nl with BS clear & equal w/o rales, rhonchi, wheezing or stridor.  Cor: Heart sounds normal w/ regular rate and  rhythm without sig. murmurs, gallops, clicks or rubs. Peripheral pulses normal and equal  without edema.  Abdomen: Soft & bowel sounds normal. Non-tender w/o guarding, rebound, hernias, masses or organomegaly.  Lymphatics: Unremarkable.  Musculoskeletal: Full ROM all peripheral extremities, joint stability, 5/5 strength and normal gait.  Skin: Warm, dry without exposed rashes, lesions or ecchymosis apparent.  Neuro: Cranial nerves intact, reflexes equal bilaterally. Sensory-motor testing grossly intact. Tendon reflexes grossly intact.  Pysch: Alert & oriented x 3.  Insight and judgement nl & appropriate. No ideations.  Assessment and Plan:  1. Essential hypertension  - Continue medication, monitor blood pressure at home.  - Continue DASH diet.  Reminder to go to the ER if any CP,  SOB, nausea, dizziness, severe HA, changes vision/speech.   - COMPLETE METABOLIC PANEL WITH GFR - Magnesium - TSH - CBC with  Differential/Platelet  2. Hyperlipidemia associated with type 2 diabetes mellitus ([place])  - Continue diet/meds, exercise,& lifestyle modifications.  - Continue monitor periodic cholesterol/liver & renal functions    - Lipid panel - TSH  3. Type 2 diabetes mellitus with stage 2 chronic kidney  disease, without long-term current use of insulin ([name])  - Continue diet, exercise  - Lifestyle modifications.  - Monitor appropriate labs   - Hemoglobin A1c - Insulin, random  4. Vitamin D deficiency  - Continue supplementation    - VITAMIN D 25 Hydroxy   5. Gout  - Uric acid  6. Medication management   - COMPLETE METABOLIC PANEL WITH GFR - Magnesium - Lipid panel - TSH - Hemoglobin A1c - VITAMIN D 25 Hydroxy - Uric acid - CBC with Differential/Platelet - Insulin, random          Discussed  regular exercise, BP monitoring, weight control to achieve/maintain BMI less than 25 and discussed med and SE's. Recommended labs to assess and monitor clinical status with further disposition pending results of labs.  I discussed the assessment and treatment plan with the patient. The patient was provided an opportunity to ask questions and all were answered. The patient agreed with the plan and demonstrated an understanding of the instructions.  I provided over 30 minutes of exam, counseling, chart review and  complex critical decision making.        The patient was advised to call back or seek an in-person evaluation if the symptoms worsen or if the condition fails to improve as anticipated.   Kirtland Bouchard, MD

## 2020-12-11 LAB — CBC WITH DIFFERENTIAL/PLATELET
Absolute Monocytes: 490 cells/uL (ref 200–950)
Basophils Absolute: 17 cells/uL (ref 0–200)
Basophils Relative: 0.2 %
Eosinophils Absolute: 191 cells/uL (ref 15–500)
Eosinophils Relative: 2.3 %
HCT: 45.8 % (ref 38.5–50.0)
Hemoglobin: 15.1 g/dL (ref 13.2–17.1)
Lymphs Abs: 3279 cells/uL (ref 850–3900)
MCH: 29.7 pg (ref 27.0–33.0)
MCHC: 33 g/dL (ref 32.0–36.0)
MCV: 90 fL (ref 80.0–100.0)
MPV: 9.7 fL (ref 7.5–12.5)
Monocytes Relative: 5.9 %
Neutro Abs: 4324 cells/uL (ref 1500–7800)
Neutrophils Relative %: 52.1 %
Platelets: 241 10*3/uL (ref 140–400)
RBC: 5.09 10*6/uL (ref 4.20–5.80)
RDW: 13.5 % (ref 11.0–15.0)
Total Lymphocyte: 39.5 %
WBC: 8.3 10*3/uL (ref 3.8–10.8)

## 2020-12-11 LAB — HEMOGLOBIN A1C
Hgb A1c MFr Bld: 6.7 % of total Hgb — ABNORMAL HIGH (ref <5.7)
Mean Plasma Glucose: 146 mg/dL
eAG (mmol/L): 8.1 mmol/L

## 2020-12-11 LAB — TSH: TSH: 1.5 mIU/L (ref 0.40–4.50)

## 2020-12-11 LAB — VITAMIN D 25 HYDROXY (VIT D DEFICIENCY, FRACTURES): Vit D, 25-Hydroxy: 80 ng/mL (ref 30–100)

## 2020-12-11 LAB — COMPLETE METABOLIC PANEL WITH GFR
AG Ratio: 1.5 (calc) (ref 1.0–2.5)
ALT: 20 U/L (ref 9–46)
AST: 25 U/L (ref 10–35)
Albumin: 4.6 g/dL (ref 3.6–5.1)
Alkaline phosphatase (APISO): 69 U/L (ref 35–144)
BUN/Creatinine Ratio: 16 (calc) (ref 6–22)
BUN: 23 mg/dL (ref 7–25)
CO2: 28 mmol/L (ref 20–32)
Calcium: 10.1 mg/dL (ref 8.6–10.3)
Chloride: 103 mmol/L (ref 98–110)
Creat: 1.4 mg/dL — ABNORMAL HIGH (ref 0.70–1.28)
Globulin: 3 g/dL (calc) (ref 1.9–3.7)
Glucose, Bld: 104 mg/dL — ABNORMAL HIGH (ref 65–99)
Potassium: 4.4 mmol/L (ref 3.5–5.3)
Sodium: 138 mmol/L (ref 135–146)
Total Bilirubin: 0.4 mg/dL (ref 0.2–1.2)
Total Protein: 7.6 g/dL (ref 6.1–8.1)
eGFR: 54 mL/min/{1.73_m2} — ABNORMAL LOW (ref >=60)

## 2020-12-11 LAB — LIPID PANEL
Cholesterol: 125 mg/dL (ref <200)
HDL: 30 mg/dL — ABNORMAL LOW (ref >=40)
LDL Cholesterol (Calc): 67 mg/dL (calc)
Non-HDL Cholesterol (Calc): 95 mg/dL (calc) (ref <130)
Total CHOL/HDL Ratio: 4.2 (calc) (ref <5.0)
Triglycerides: 201 mg/dL — ABNORMAL HIGH (ref <150)

## 2020-12-11 LAB — URIC ACID: Uric Acid, Serum: 4.5 mg/dL (ref 4.0–8.0)

## 2020-12-11 LAB — MAGNESIUM: Magnesium: 1.9 mg/dL (ref 1.5–2.5)

## 2020-12-11 LAB — INSULIN, RANDOM: Insulin: 45.8 u[IU]/mL — ABNORMAL HIGH

## 2020-12-11 NOTE — Progress Notes (Signed)
============================================================ ============================================================  -  Kidney Functions still Stage 3a    - Kidney functions still look a little dehydrated    Very important to drink adequate amounts of fluids to prevent permanent damage    - Recommend drink at least 6 bottles (16 ounces) of fluids /water /day = 96 Oz ~100 oz  - 100 oz = 3,000 cc or 3 liters / day  - >> That's 1 &1/2 bottles of a 2 liter soda bottle /day !  ============================================================ ============================================================  - Total Chol = 125    &   LD:L Chol = 67 - Both  Excellent   - Very low risk for Heart Attack  / Stroke ============================================================ ============================================================  -  But Triglycerides (   201  ) or fats in blood are too high  (goal is less than 150)    - Recommend avoid fried & greasy foods,  sweets / candy,   - Avoid white rice  (brown or wild rice or Quinoa is OK),   - Avoid white potatoes  (sweet potatoes are OK)   - Avoid anything made from white flour  - bagels, doughnuts, rolls, buns, biscuits, white and   wheat breads, pizza crust and traditional  pasta made of white flour & egg white  - (vegetarian pasta or spinach or wheat pasta is OK).    - Multi-grain bread is OK - like multi-grain flat bread or  sandwich thins.   - Avoid alcohol in excess.   - Exercise is also important. ============================================================ ============================================================  -  A1c = 6.7% - still too high - Ideal or Goal is less than 5.7%   !  -  Being diabetic has a  300% increased risk for heart attack,  stroke, cancer, and alzheimer- type vascular dementia.   It is very important that you work harder with diet by  avoiding all foods that are white except chicken,   fish &  calliflower.  - Avoid white rice  (brown & wild rice is OK),   - Avoid white potatoes  (sweet potatoes in moderation is OK),   White bread or wheat bread or anything made out of   white flour like bagels, donuts, rolls, buns, biscuits, cakes,  - pastries, cookies, pizza crust, and pasta (made from  white flour & egg whites)   - vegetarian pasta or spinach or wheat pasta is OK.  - Multigrain breads like Arnold's, Pepperidge Farm or   multigrain sandwich thins or high fiber breads like   [place] bread or "Dave's Killer" breads that are  4 to 5 grams fiber per slice !  are best.    Diet, exercise and weight loss can reverse and cure  diabetes in the early stages.    - Diet, exercise and weight loss is very important in the   control and prevention of complications of diabetes which  affects every system in your body, ie.   -Brain - dementia/stroke,  - eyes - glaucoma/blindness,  - heart - heart attack/heart failure,  - kidneys - dialysis,  - stomach - gastric paralysis,  - intestines - malabsorption,  - nerves - severe painful neuritis,  - circulation - gangrene & loss of a leg(s)  - and finally  . . . . . . . . . . . . . . . . . .    - cancer and Alzheimers. ============================================================ ============================================================  - Vitamin D = 80 - Excellent  ! ============================================================ ============================================================  - All Else -  CBC - Electrolytes - Liver - Magnesium & Thyroid    - all  Normal / OK ============================================================ ============================================================

## 2020-12-15 ENCOUNTER — Other Ambulatory Visit: Payer: Self-pay

## 2020-12-15 MED ORDER — FREESTYLE LANCETS MISC: 3 refills | Status: DC

## 2020-12-15 MED ORDER — FREESTYLE LITE TEST VI STRP: ORAL_STRIP | 3 refills | Status: DC

## 2020-12-18 ENCOUNTER — Telehealth: Payer: Self-pay

## 2020-12-18 NOTE — Telephone Encounter (Signed)
Freestyle Freedom Lite Meter has been approved 04/19/2020 to 04/18/2021. Authorization number [id number]

## 2020-12-22 ENCOUNTER — Other Ambulatory Visit: Payer: Self-pay | Admitting: Internal Medicine

## 2020-12-22 MED ORDER — FREESTYLE LITE TEST VI STRP: ORAL_STRIP | 3 refills | Status: DC

## 2020-12-22 MED ORDER — FREESTYLE LANCETS MISC: 3 refills | Status: AC

## 2020-12-25 ENCOUNTER — Other Ambulatory Visit: Payer: Self-pay

## 2020-12-25 DIAGNOSIS — N182 Chronic kidney disease, stage 2 (mild): Secondary | ICD-10-CM

## 2020-12-25 MED ORDER — FREESTYLE LITE TEST VI STRP: ORAL_STRIP | 3 refills | Status: AC

## 2020-12-26 ENCOUNTER — Other Ambulatory Visit: Payer: Self-pay

## 2020-12-26 ENCOUNTER — Ambulatory Visit (AMBULATORY_SURGERY_CENTER): Payer: Medicare PPO | Admitting: *Deleted

## 2020-12-26 VITALS — Ht 65.0 in | Wt 194.0 lb

## 2020-12-26 DIAGNOSIS — Z1211 Encounter for screening for malignant neoplasm of colon: Secondary | ICD-10-CM

## 2020-12-26 MED ORDER — NA SULFATE-K SULFATE-MG SULF 17.5-3.13-1.6 GM/177ML PO SOLN
1.0000 | Freq: Once | ORAL | 0 refills | Status: AC
Start: 2020-12-26 — End: 2020-12-26

## 2020-12-26 NOTE — Progress Notes (Signed)
No egg or soy allergy known to patient  No issues known to pt with past sedation with any surgeries or procedures- PONV with morphine  Patient denies ever being told they had issues or difficulty with intubation  No FH of Malignant Hyperthermia pt was given script of Phentermine 11-07-2020- he has not taken in a while per pt- instructed to not restart until after the colon 01-12-2021 Pt is not on  home 02  Pt is not on blood thinners  Pt denies issues with constipation  No A fib or A flutter  EMMI video to pt or via [place] 19 guidelines implemented in [place] today with Pt and RN   Pt is fully vaccinated  for Covid   Due to the COVID-19 pandemic we are asking patients to follow certain guidelines.  Pt aware of COVID protocols and [facility] guidelines   Pt verified name, DOB, address and insurance during PV today.  Pt mailed instruction packet of Emmi video, copy of consent form to read and not return, and instructions.  PV completed over the phone.  Pt encouraged to call with questions or issues.  My Chart instructions to pt as well

## 2021-01-01 ENCOUNTER — Other Ambulatory Visit: Payer: Self-pay | Admitting: Adult Health

## 2021-01-12 ENCOUNTER — Ambulatory Visit (AMBULATORY_SURGERY_CENTER): Payer: Medicare PPO | Admitting: Gastroenterology

## 2021-01-12 ENCOUNTER — Encounter: Payer: Self-pay | Admitting: Gastroenterology

## 2021-01-12 ENCOUNTER — Other Ambulatory Visit: Payer: Self-pay

## 2021-01-12 VITALS — BP 101/61 | HR 58 | Temp 96.9°F | Resp 11 | Ht 65.0 in | Wt 194.0 lb

## 2021-01-12 DIAGNOSIS — D128 Benign neoplasm of rectum: Secondary | ICD-10-CM | POA: Diagnosis not present

## 2021-01-12 DIAGNOSIS — Z1211 Encounter for screening for malignant neoplasm of colon: Secondary | ICD-10-CM

## 2021-01-12 DIAGNOSIS — D122 Benign neoplasm of ascending colon: Secondary | ICD-10-CM | POA: Diagnosis not present

## 2021-01-12 MED ORDER — SODIUM CHLORIDE 0.9 % IV SOLN
500.0000 mL | Freq: Once | INTRAVENOUS | Status: DC
Start: 2021-01-12 — End: 2021-01-12

## 2021-01-12 NOTE — Progress Notes (Signed)
Referring Provider: [name], MD Primary Care Physician:  [name], MD  Reason for Procedure:  Colon cancer surveillance   IMPRESSION:  Need for colon cancer surveillance  PLAN: Colonoscopy in the [place] today   HPI: Cody Zamora is a 70 y.o. male presents for surveillance colonoscopy.  Colonoscopy 04/06/2000: Sigmoid diverticulosis  Colonoscopy 10/28/10: Sigmoid diverticulosis and 55m sigmoid polyp. Pathology revealed hamartomatous polyp versus a polypoid lesion. Surveillance recommended in 10 years.   No baseline GI symptoms.   No known family history of colon cancer or polyps. No family history of uterine/endometrial cancer, pancreatic cancer or gastric/stomach cancer.   Past Medical History:  Diagnosis Date   Allergy    Asthma    as a child   Cancer ([facility]    prostate   Cataract    ED (erectile dysfunction) of organic origin    Gout    History of colon polyps    History of kidney stones    History of positive PPD    06/ 2006--  CXR NORMAL   History of prostate cancer    DEC 2008--  S/P  RADICAL PROSTATECTOMY   Hyperlipidemia    Hypertension    Hypogonadism male    Mixed hyperlipidemia    Nocturia    Sigmoid diverticulosis    MILD   [place] 2 [place]    type 2    Past Surgical History:  Procedure Laterality Date   ACHILLES TENDON REPAIR Right 1989   COLONOSCOPY N/A last one 10-28-2010    [place] 10 yr f/u in 2022 [place] 70  left Middle   HOLMIUM LASER APPLICATION Right 136/14/4315  Procedure: HOLMIUM LASER APPLICATION;  Surgeon: OKathie Rhodes MD;  [facility];  Service: Urology;  Laterality: Right;   IR URETERAL STENT RIGHT NEW ACCESS W/O SEP NEPHROSTOMY CATH  02/05/2019   NEGATIVE SLEEP STUDY  2010  per pt   NEPHROLITHOTOMY Right 02/05/2019   Procedure: NEPHROLITHOTOMY PERCUTANEOUS, STENT PLACEMENT;  Surgeon: OKathie Rhodes MD;  [facility];  Service: Urology;  Laterality: Right;    PENILE PROSTHESIS IMPLANT N/A 10/28/2014   Procedure: IMPLANTATION 3 PIECE PENILE INFLATABLE PROTHESIS/COLOPLAST SCROTAL APPROACH;  Surgeon: MKathie Rhodes MD;  [facility] [facility]  Service: Urology;  Laterality: N/A;   PERCUTANEOUS NEPHROLITHOTRIPSY Left 04-04-2009// second look 04-15-2009   ROBOT ASSISTED LAPAROSCOPIC RADICAL PROSTATECTOMY  Dec 2008   SHOULDER OPEN ROTATOR CUFF REPAIR Right 05-11-2006   URETEROLITHOTOMY  2002    Current Outpatient Medications  Medication Sig Dispense Refill   allopurinol (ZYLOPRIM) 300 MG tablet Take 1 tablet Daily to Prevent Gout 90 tablet 3   aspirin 81 MG tablet Take 81 mg by mouth daily.     bisoprolol-hydrochlorothiazide (ZIAC) 5-6.25 MG tablet Take 1 tablet Daily for BP 90 tablet 3   blood glucose meter kit and supplies KIT Use to check blood sugar once daily 1 each 0   Blood Glucose Monitoring Suppl (FREESTYLE FREEDOM LITE) w/Device KIT Check blood sugar 1 time daily-DX-E11.22 1 kit 0   diclofenac Sodium (VOLTAREN) 1 % GEL Apply 1 application topically 4 (four) times daily.     fluticasone (FLONASE) 50 MCG/ACT nasal spray Place 1 spray into both nostrils daily as needed for allergies. (Patient not taking: Reported on 12/26/2020) 20 g 1   glucose blood (FREESTYLE LITE) test strip Check blood sugar 1 time daily-E11.22 100 each 3   Lancets (FREESTYLE) lancets Check blood  sugar 1 time daily-E11.22 100 each 3   metFORMIN (GLUCOPHAGE) 500 MG tablet TAKE 1 TABLETS THREE TIMES A DAY WITH MEALS FOR DIABETES 270 tablet 3   Multiple Vitamins-Minerals (MULTIVITAMIN WITH MINERALS) tablet Take 1 tablet by mouth daily.     Omega-3 Fatty Acids (FISH OIL) 1000 MG CAPS Take 1,000 mg by mouth daily.     phentermine (ADIPEX-P) 37.5 MG tablet Take 1 tablet (37.5 mg total) by mouth daily before breakfast. (Patient not taking: No sig reported) 30 tablet 2   rosuvastatin (CRESTOR) 5 MG tablet TAKE 1 TABLET 3 NIGHTS EVERY WEEK FOR CHOLESTEROL 36 tablet 3    terbinafine (LAMISIL) 250 MG tablet Take 1 tablet Daily for Toenail Fungus (Patient not taking: Reported on 12/26/2020) 90 tablet 1   valsartan (DIOVAN) 320 MG tablet Take 1 tablet Daily for BP 90 tablet 3   VITAMIN D PO Take 5,000 Units by mouth daily.     Wheat Dextrin (BENEFIBER DRINK MIX PO) Take 1 Package by mouth daily.      No current facility-administered medications for this visit.    Allergies as of 01/12/2021 - Review Complete 12/26/2020  Allergen Reaction Noted   Cialis [tadalafil] Other (See Comments) 04/05/2013   Fructose Nausea And Vomiting 10/15/2010   Morphine and related Itching 10/15/2010    Family History  Problem Relation Age of Onset   Heart disease Mother    Diabetes Mother    Arthritis Mother    Alcohol abuse Father    Hypertension Father    Colon cancer Neg Hx    Colon polyps Neg Hx    Esophageal cancer Neg Hx    Rectal cancer Neg Hx    Stomach cancer Neg Hx      Physical Exam: General:   Alert,  well-nourished, pleasant and cooperative in NAD Head:  Normocephalic and atraumatic. Eyes:  Sclera clear, no icterus.   Conjunctiva pink. Mouth:  No deformity or lesions.   Neck:  Supple; no masses or thyromegaly. Lungs:  Clear throughout to auscultation.   No wheezes. Heart:  Regular rate and rhythm; no murmurs. Abdomen:  Soft, non-tender, nondistended, normal bowel sounds, no rebound or guarding.  Msk:  Symmetrical. No boney deformities LAD: No inguinal or umbilical LAD Extremities:  No clubbing or edema. Neurologic:  Alert and  oriented x4;  grossly nonfocal Skin:  No obvious rash or bruise. Psych:  Alert and cooperative. Normal mood and affect.      Cody Zamora L. Tarri Glenn, MD, MPH 01/12/2021, 1:09 PM

## 2021-01-12 NOTE — Progress Notes (Signed)
Called to room to assist during endoscopic procedure.  Patient ID and intended procedure confirmed with present staff. Received instructions for my participation in the procedure from the performing physician.  

## 2021-01-12 NOTE — Progress Notes (Signed)
Pt's states no medical or surgical changes since previsit or office visit.   Check-in-LINDA  V/S-DT

## 2021-01-12 NOTE — Patient Instructions (Signed)
Handouts given on diverticulosis, polyps, hemorrhoids, and high-fiber diet. Consider using a daily stool bulking agent such as Benefiber or Metamucil. Await pathology results. Repeat colonoscopy for surveillance to be determined after pathology results come back.   YOU HAD AN ENDOSCOPIC PROCEDURE TODAY AT [facility]:   Refer to the procedure report that was given to you for any specific questions about what was found during the examination.  If the procedure report does not answer your questions, please call your gastroenterologist to clarify.  If you requested that your care partner not be given the details of your procedure findings, then the procedure report has been included in a sealed envelope for you to review at your convenience later.  YOU SHOULD EXPECT: Some feelings of bloating in the abdomen. Passage of more gas than usual.  Walking can help get rid of the air that was put into your GI tract during the procedure and reduce the bloating. If you had a lower endoscopy (such as a colonoscopy or flexible sigmoidoscopy) you may notice spotting of blood in your stool or on the toilet paper. If you underwent a bowel prep for your procedure, you may not have a normal bowel movement for a few days.  Please Note:  You might notice some irritation and congestion in your nose or some drainage.  This is from the oxygen used during your procedure.  There is no need for concern and it should clear up in a day or so.  SYMPTOMS TO REPORT IMMEDIATELY:  Following lower endoscopy (colonoscopy or flexible sigmoidoscopy):  Excessive amounts of blood in the stool  Significant tenderness or worsening of abdominal pains  Swelling of the abdomen that is new, acute  Fever of 100F or higher  For urgent or emergent issues, a gastroenterologist can be reached at any hour by calling [phone]. Do not use MyChart messaging for urgent concerns.    DIET:  We do recommend a small meal at  first, but then you may proceed to your regular diet.  Drink plenty of fluids but you should avoid alcoholic beverages for 24 hours.  ACTIVITY:  You should plan to take it easy for the rest of today and you should NOT DRIVE or use heavy machinery until tomorrow (because of the sedation medicines used during the test).    FOLLOW UP: Our staff will call the number listed on your records 48-72 hours following your procedure to check on you and address any questions or concerns that you may have regarding the information given to you following your procedure. If we do not reach you, we will leave a message.  We will attempt to reach you two times.  During this call, we will ask if you have developed any symptoms of COVID 19. If you develop any symptoms (ie: fever, flu-like symptoms, shortness of breath, cough etc.) before then, please call [phone].  If you test positive for Covid 19 in the 2 weeks post procedure, please call and report this information to [place].    If any biopsies were taken you will be contacted by phone or by letter within the next 1-3 weeks.  Please call us at [phone] if you have not heard about the biopsies in 3 weeks.    SIGNATURES/CONFIDENTIALITY: You and/or your care partner have signed paperwork which will be entered into your electronic medical record.  These signatures attest to the fact that that the information above on your After Visit Summary has been reviewed and  is understood.  Full responsibility of the confidentiality of this discharge information lies with you and/or your care-partner.

## 2021-01-12 NOTE — Progress Notes (Signed)
PT taken to PACU. Monitors in place. VSS. Report given to RN. 

## 2021-01-12 NOTE — Op Note (Signed)
[facility] Patient Name: Cody Zamora Procedure Date: 01/12/2021 1:42 PM MRN: [id number] Endoscopist: Thornton Park MD, MD Age: 70 Referring MD:  Date of Birth: 1950/10/23 Gender: Male Account #: [id number] Procedure:                Colonoscopy Indications:              Screening for colorectal malignant neoplasm                           Colonoscopy 04/06/2000: Sigmoid diverticulosis                           Colonoscopy 10/28/10: Sigmoid diverticulosis and                            66mm sigmoid polyp. Pathology revealed                            hamartomatous polyp versus a polypoid lesion.                            Surveillance recommended in 10 years.                           No known family history of colon cancer or polyps Medicines:                Monitored Anesthesia Care Procedure:                Pre-Anesthesia Assessment:                           - Prior to the procedure, a History and Physical                            was performed, and patient medications and                            allergies were reviewed. The patient's tolerance of                            previous anesthesia was also reviewed. The risks                            and benefits of the procedure and the sedation                            options and risks were discussed with the patient.                            All questions were answered, and informed consent                            was obtained. Prior Anticoagulants: The patient has  taken no previous anticoagulant or antiplatelet                            agents. ASA Grade Assessment: III - A patient with                            severe systemic disease. After reviewing the risks                            and benefits, the patient was deemed in                            satisfactory condition to undergo the procedure.                           After obtaining informed consent, the  colonoscope                            was passed under direct vision. Throughout the                            procedure, the patient's blood pressure, pulse, and                            oxygen saturations were monitored continuously. The                            Olympus CF-HQ190L [id number]) Colonoscope was                            introduced through the anus and advanced to the the                            cecum, identified by appendiceal orifice and                            ileocecal valve. A second forward view of the right                            colon was performed. The colonoscopy was performed                            without difficulty. The patient tolerated the                            procedure well. The quality of the bowel                            preparation was good. The terminal ileum, ileocecal                            valve, appendiceal orifice, and rectum were  photographed. Scope In: 2:29:25 PM Scope Out: 2:43:11 PM Scope Withdrawal Time: 0 hours 9 minutes 8 seconds  Total Procedure Duration: 0 hours 13 minutes 46 seconds  Findings:                 The perianal and digital rectal examinations were                            normal.                           Multiple small and large-mouthed diverticula were                            found in the sigmoid colon and descending colon and                            ascending colon.                           A 5 mm polyp was found in the rectum. The polyp was                            sessile. The polyp was removed with a cold snare.                            Resection and retrieval were complete. Estimated                            blood loss was minimal.                           A 3 mm polyp was found in the ascending colon. The                            polyp was sessile. The polyp was removed with a                            cold snare. Resection and retrieval were  complete.                            Estimated blood loss was minimal.                           The exam was otherwise without abnormality on                            direct and retroflexion views. Complications:            No immediate complications. Estimated blood loss:                            Minimal. Estimated Blood Loss:     Estimated blood loss was minimal. Impression:               - Diverticulosis in the sigmoid colon and  in the                            descending colon.                           - One 5 mm polyp in the rectum, removed with a cold                            snare. Resected and retrieved.                           - One 3 mm polyp in the ascending colon, removed                            with a cold snare. Resected and retrieved.                           - The examination was otherwise normal on direct                            and retroflexion views. Recommendation:           - Patient has a contact number available for                            emergencies. The signs and symptoms of potential                            delayed complications were discussed with the                            patient. Return to normal activities tomorrow.                            Written discharge instructions were provided to the                            patient.                           - High fiber diet. Consider using a daily stool                            bulking agent such as Benefiber or Metamucil.                           - Continue present medications.                           - Await pathology results.                           - Repeat colonoscopy date to be determined after  pending pathology results are reviewed for                            surveillance.                           - Emerging evidence supports eating a diet of                            fruits, vegetables, grains, calcium, and yogurt                             while reducing red meat and alcohol may reduce the                            risk of colon cancer.                           - Thank you for allowing me to be involved in your                            colon cancer prevention. Thornton Park MD, MD 01/12/2021 2:49:05 PM This report has been signed electronically.

## 2021-01-13 ENCOUNTER — Other Ambulatory Visit: Payer: Self-pay

## 2021-01-14 ENCOUNTER — Telehealth: Payer: Self-pay | Admitting: *Deleted

## 2021-01-14 ENCOUNTER — Telehealth: Payer: Self-pay

## 2021-01-14 NOTE — Telephone Encounter (Signed)
  Follow up Call-  Call back number 01/12/2021  Post procedure Call Back phone  # [phone]  Permission to leave phone message Yes  Some recent data might be hidden     Patient questions:  Message left to call us if necessary.

## 2021-01-14 NOTE — Telephone Encounter (Signed)
  Follow up Call-  Call back number 01/12/2021  Post procedure Call Back phone  # [phone]  Permission to leave phone message Yes  Some recent data might be hidden     Patient questions:  Do you have a fever, pain , or abdominal swelling? No. Pain Score  0 *  Have you tolerated food without any problems? Yes.    Have you been able to return to your normal activities? Yes.    Do you have any questions about your discharge instructions: Diet   No. Medications  No. Follow up visit  No.  Do you have questions or concerns about your Care? No.  Patient unavailable. Spoke with patient's wife who answered follow-up questions.  Actions: * If pain score is 4 or above: No action needed, pain <4. Have you developed a fever since your procedure? no  2.   Have you had an respiratory symptoms (SOB or cough) since your procedure? no  3.   Have you tested positive for COVID 19 since your procedure no  4.   Have you had any family members/close contacts diagnosed with the COVID 19 since your procedure?  no   If yes to any of these questions please route to Joylene John, RN and Joella Prince, RN

## 2021-01-16 ENCOUNTER — Encounter: Payer: Self-pay | Admitting: Gastroenterology

## 2021-02-05 ENCOUNTER — Other Ambulatory Visit: Payer: Self-pay

## 2021-02-05 MED ORDER — ROSUVASTATIN CALCIUM 5 MG PO TABS: ORAL_TABLET | ORAL | 3 refills | Status: AC

## 2021-02-17 ENCOUNTER — Ambulatory Visit: Payer: Medicare Other | Admitting: Adult Health

## 2021-02-23 DIAGNOSIS — Z6832 Body mass index (BMI) 32.0-32.9, adult: Secondary | ICD-10-CM | POA: Diagnosis not present

## 2021-02-23 DIAGNOSIS — Z0000 Encounter for general adult medical examination without abnormal findings: Secondary | ICD-10-CM | POA: Diagnosis not present

## 2021-02-23 DIAGNOSIS — Z89022 Acquired absence of left finger(s): Secondary | ICD-10-CM | POA: Diagnosis not present

## 2021-02-23 DIAGNOSIS — E669 Obesity, unspecified: Secondary | ICD-10-CM | POA: Diagnosis not present

## 2021-04-02 NOTE — Progress Notes (Deleted)
FOLLOW UP  Assessment and Plan:   Hypertension Well controlled with current medications  Monitor blood pressure at home; patient to call if consistently greater than 130/80 Continue DASH diet.   Reminder to go to the ER if any CP, SOB, nausea, dizziness, severe HA, changes vision/speech, left arm numbness and tingling and jaw pain.  Hyperlipidemia associated with T2DM ([place]) Currently above goal on zetia; hx of statin intolerance  Continue low cholesterol diet and exercise.  Check lipid panel.   Diabetes with diabetic chronic kidney disease ([place]) Continue medication: metformin Continue diet and exercise.  Perform daily foot/skin check, notify office of any concerning changes.  Check A1C  CKD 2 associated with T2DM ([name]) Increase fluids, avoid NSAIDS, monitor sugars, will monitor Check CMP/GFR  Obesity with co morbidities- BMI  Will start on phentermine, follow up 1 month Try dietician, check out apps Long discussion about weight loss, diet, and exercise Recommended diet heavy in fruits and veggies and low in animal meats, cheeses, and dairy products, appropriate calorie intake Discussed ideal weight for height  Continue with hello fresh, increasing veggies, add exercise 30 min 3x/week Will follow up in 3 months  Vitamin D Def At goal at last visit; continue supplementation for goal of 60-100 Defer Vit D level  Depression/anxiety  Significantly improved with celexa 20 mg daily and patient feels well controlled Lifestyle discussed: diet/exerise, sleep hygiene, stress management, hydration  Continue diet and meds as discussed. Further disposition pending results of labs. Discussed med's effects and SE's.   Over 30 minutes of exam, counseling, chart review, and critical decision making was performed.   Future Appointments  Date Time Provider [place]  04/07/2021  9:30 AM Magda Bernheim, NP [facility] None  08/05/2021  3:00 PM [name], MD [facility] None   09/15/2021 11:00 AM Liane Comber, NP [facility] None    ----------------------------------------------------------------------------------------------------------------------  HPI 70 y.o. male  presents for 3 month follow up on hypertension, cholesterol, diabetes, CKD2, obesity and vitamin D deficiency.   He has hx of prostate CA and s/p radical prostatectomy in 2008, continues to follow with Dr. Karsten Ro for this and renal calculi.   He has depression/anxiety, currently prescribed celexa 20 mg daily  He describes ongoing depressive symptoms related to his wife's health condition but improved with medication.   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 He can cook them.   Wt Readings from Last 3 Encounters:  01/12/21 194 lb (88 kg)  12/26/20 194 lb (88 kg)  12/10/20 194 lb 3.2 oz (88.1 kg)   His blood pressure has been controlled at home, today their BP is    He does workout. He denies chest pain, shortness of breath, dizziness.   He is on cholesterol medication (zetia 10 mg daily, hx of statin intolerance) and denies myalgias. His cholesterol is not at goal. The cholesterol last visit was:   Lab Results  Component Value Date   CHOL 125 12/10/2020   HDL 30 (L) 12/10/2020   LDLCALC 67 12/10/2020   TRIG 201 (H) 12/10/2020   CHOLHDL 4.2 12/10/2020    He has been working on diet and exercise for T2DM on metformin, and denies foot ulcerations, hyperglycemia, hypoglycemia , increased appetite, nausea, paresthesia of the feet, polydipsia, polyuria, visual disturbances, vomiting and weight loss. Fasting sugar is a little higher than  120.  Last A1C in the office was:  Lab Results  Component Value Date   HGBA1C 6.7 (H) 12/10/2020   He has CKD II associated with T2DM monitored at this office:  Lab Results  Component Value Date   GFRAA 72  09/11/2020   Patient is on Vitamin D supplement:    Lab Results  Component Value Date   VD25OH 80 12/10/2020   Patient is on allopurinol for gout and does not report a recent flare, he increased allopurinol to 300 mg BID and endorses improved pain in feet.  Lab Results  Component Value Date   LABURIC 4.5 12/10/2020     Current Medications:  Current Outpatient Medications on File Prior to Visit  Medication Sig   allopurinol (ZYLOPRIM) 300 MG tablet Take 1 tablet Daily to Prevent Gout   aspirin 81 MG tablet Take 81 mg by mouth daily.   bisoprolol-hydrochlorothiazide (ZIAC) 5-6.25 MG tablet Take 1 tablet Daily for BP   blood glucose meter kit and supplies KIT Use to check blood sugar once daily   Blood Glucose Monitoring Suppl (FREESTYLE FREEDOM LITE) w/Device KIT Check blood sugar 1 time daily-DX-E11.22 (Patient not taking: Reported on 01/12/2021)   diclofenac Sodium (VOLTAREN) 1 % GEL Apply 1 application topically 4 (four) times daily.   fluticasone (FLONASE) 50 MCG/ACT nasal spray Place 1 spray into both nostrils daily as needed for allergies.   glucose blood (FREESTYLE LITE) test strip Check blood sugar 1 time daily-E11.22 (Patient not taking: Reported on 01/12/2021)   Lancets (FREESTYLE) lancets Check blood sugar 1 time daily-E11.22   metFORMIN (GLUCOPHAGE) 500 MG tablet TAKE 1 TABLETS THREE TIMES A DAY WITH MEALS FOR DIABETES   Multiple Vitamins-Minerals (MULTIVITAMIN WITH MINERALS) tablet Take 1 tablet by mouth daily.   Omega-3 Fatty Acids (FISH OIL) 1000 MG CAPS Take 1,000 mg by mouth daily.   phentermine (ADIPEX-P) 37.5 MG tablet Take 1 tablet (37.5 mg total) by mouth daily before breakfast. (Patient not taking: No sig reported)   rosuvastatin (CRESTOR) 5 MG tablet TAKE 1 TABLET 3 NIGHTS EVERY WEEK FOR CHOLESTEROL   terbinafine (LAMISIL) 250 MG tablet Take 1 tablet Daily for Toenail Fungus (Patient not taking: No sig reported)   valsartan (DIOVAN) 320 MG tablet Take 1 tablet Daily  for BP   VITAMIN D PO Take 5,000 Units by mouth daily.   Wheat Dextrin (BENEFIBER DRINK MIX PO) Take 1 Package by mouth daily.    No current facility-administered medications on file prior to visit.     Allergies:  Allergies  Allergen Reactions   Cialis [Tadalafil] Other (See Comments)    Hot flashes   Fructose Nausea And Vomiting   Morphine And Related Itching     Medical History:  Past Medical History:  Diagnosis Date   Allergy    Asthma    as a child   Cancer [facility])    prostate   Cataract    ED (erectile dysfunction) of organic origin    Gout    History of colon polyps    History of kidney stones    History of positive PPD    06/ 2006--  CXR NORMAL   History of prostate cancer    DEC 2008--  S/P  RADICAL PROSTATECTOMY   Hyperlipidemia    Hypertension    Hypogonadism male    Mixed hyperlipidemia    Nocturia    Sigmoid diverticulosis    MILD   Type 2 diabetes mellitus ([place])    type  2   Family history- Reviewed and unchanged Social history- Reviewed and unchanged   Review of Systems:  Review of Systems  Constitutional:  Negative for malaise/fatigue and weight loss.  HENT:  Negative for hearing loss and tinnitus.   Eyes:  Negative for blurred vision and double vision.  Respiratory:  Negative for cough, shortness of breath and wheezing.   Cardiovascular:  Negative for chest pain, palpitations, orthopnea, claudication and leg swelling.  Gastrointestinal:  Negative for abdominal pain, blood in stool, constipation, diarrhea, heartburn, melena, nausea and vomiting.  Genitourinary: Negative.   Musculoskeletal:  Negative for joint pain and myalgias.  Skin:  Negative for rash.  Neurological:  Negative for dizziness, tingling, sensory change, weakness and headaches.  Endo/Heme/Allergies:  Negative for polydipsia.  Psychiatric/Behavioral:  Negative for depression, memory loss, substance abuse and suicidal ideas. The patient is not nervous/anxious and does not have  insomnia.   All other systems reviewed and are negative.   Physical Exam: There were no vitals taken for this visit. Wt Readings from Last 3 Encounters:  01/12/21 194 lb (88 kg)  12/26/20 194 lb (88 kg)  12/10/20 194 lb 3.2 oz (88.1 kg)   General Appearance: Well nourished, in no apparent distress. Eyes: PERRLA, EOMs, conjunctiva no swelling or erythema Sinuses: No Frontal/maxillary tenderness ENT/Mouth: Ext aud canals clear, TMs without erythema, bulging. No erythema, swelling, or exudate on post pharynx.  Tonsils not swollen or erythematous. Hearing normal.  Neck: Supple, thyroid normal.  Respiratory: Respiratory effort normal, BS equal bilaterally without rales, rhonchi, wheezing or stridor.  Cardio: RRR with 1/6 systolic murmur best heard at RSB. Brisk peripheral pulses without edema.  Abdomen: Soft, + BS.  Non tender, no guarding, rebound, hernias, masses. Lymphatics: Non tender without lymphadenopathy.  Musculoskeletal: Full ROM, 5/5 strength, Normal gait. No bony abnormality, effusion/swelling  Skin: Warm, dry without rashes, lesions, ecchymosis.  Neuro: Cranial nerves intact. No cerebellar symptoms.  Psych: Awake and oriented X 3, normal affect, Insight and Judgment appropriate.    Magda Bernheim, NP 12:56 PM [facility]

## 2021-04-07 ENCOUNTER — Ambulatory Visit: Payer: Medicare PPO | Admitting: Nurse Practitioner

## 2021-04-07 DIAGNOSIS — E669 Obesity, unspecified: Secondary | ICD-10-CM | POA: Diagnosis not present

## 2021-04-07 DIAGNOSIS — Z0001 Encounter for general adult medical examination with abnormal findings: Secondary | ICD-10-CM | POA: Diagnosis not present

## 2021-04-07 DIAGNOSIS — I10 Essential (primary) hypertension: Secondary | ICD-10-CM | POA: Diagnosis not present

## 2021-04-07 DIAGNOSIS — Z1159 Encounter for screening for other viral diseases: Secondary | ICD-10-CM | POA: Diagnosis not present

## 2021-04-07 DIAGNOSIS — E1140 Type 2 diabetes mellitus with diabetic neuropathy, unspecified: Secondary | ICD-10-CM | POA: Diagnosis not present

## 2021-04-07 DIAGNOSIS — N5231 Erectile dysfunction following radical prostatectomy: Secondary | ICD-10-CM | POA: Diagnosis not present

## 2021-04-07 DIAGNOSIS — M109 Gout, unspecified: Secondary | ICD-10-CM | POA: Diagnosis not present

## 2021-04-07 DIAGNOSIS — E1169 Type 2 diabetes mellitus with other specified complication: Secondary | ICD-10-CM | POA: Diagnosis not present

## 2021-04-07 DIAGNOSIS — Z79899 Other long term (current) drug therapy: Secondary | ICD-10-CM | POA: Diagnosis not present

## 2021-06-02 ENCOUNTER — Encounter: Payer: TRICARE For Life (TFL) | Admitting: Internal Medicine

## 2021-07-28 ENCOUNTER — Ambulatory Visit (INDEPENDENT_AMBULATORY_CARE_PROVIDER_SITE_OTHER): Payer: Medicare PPO | Admitting: Podiatry

## 2021-07-28 ENCOUNTER — Encounter: Payer: Self-pay | Admitting: Podiatry

## 2021-07-28 ENCOUNTER — Ambulatory Visit (INDEPENDENT_AMBULATORY_CARE_PROVIDER_SITE_OTHER): Payer: Medicare Other

## 2021-07-28 DIAGNOSIS — S90852A Superficial foreign body, left foot, initial encounter: Secondary | ICD-10-CM

## 2021-07-28 MED ORDER — MUPIROCIN 2 % EX OINT: 1.0000 "application " | TOPICAL_OINTMENT | Freq: Two times a day (BID) | CUTANEOUS | 0 refills | Status: AC

## 2021-07-28 NOTE — Progress Notes (Signed)
He presents today after have not seen him in a couple years he is planning a trip to [place] and [place] in about 1 month and has had a sore spot on the bottom of his foot for the past 5 or 6 days he denies stepping on anything.  He states that he tried to peel the skin back but did not see anything. ? ?Objective: Vital signs are stable he is alert oriented x3 pulses are palpable.  His small black area on the plantar aspect of the left foot near the heel is tender on palpation.  Was not debrided it gently I noted that it appeared to be a thorn plant material in the wound.  I could not retrieve it without local anesthetic due to his tenderness.  So I localized it with 50-50 mixture Marcaine plain lidocaine plain 2 cc was injected around the lesion.  And then dressed it with Betadine and then continue to remove the remainder of the foreign body.  Area was copiously lavaged.  Then placed a dry sterile compressive dressing. ? ?Assessment: Foreign body. ? ?Plan: Remove foreign body today with local anesthetic and provided him a prescription for Bactroban ointment and request that he soak in Epsom salts and warm water. ? ?I will follow-up with him in a couple of weeks to make sure he is doing well.  Should he develop fever chills nausea vomiting to notify us immediately ?

## 2021-08-05 ENCOUNTER — Encounter: Payer: Medicare PPO | Admitting: Internal Medicine

## 2021-08-11 ENCOUNTER — Ambulatory Visit: Payer: Medicare PPO | Admitting: Podiatry

## 2021-09-11 DIAGNOSIS — R809 Proteinuria, unspecified: Secondary | ICD-10-CM | POA: Insufficient documentation

## 2021-09-11 DIAGNOSIS — E1129 Type 2 diabetes mellitus with other diabetic kidney complication: Secondary | ICD-10-CM | POA: Insufficient documentation

## 2021-09-11 NOTE — Progress Notes (Deleted)
MEDICARE ANNUAL WELLNESS VISIT AND OV  Assessment:   Assessment and Plan:   Annual Medicare Wellness Visit Due annually  Health maintenance reviewed  Essential hypertension - continue medications, DASH diet, exercise and monitor at home. Call if greater than 130/80.  -     CBC with Differential/Platelet -     COMPLETE METABOLIC PANEL WITH GFR -     TSH -     bisoprolol-hydrochlorothiazide (ZIAC) 5-6.25 MG tablet; Take 1 tablet by mouth daily. for blood pressure ***  Type 2 diabetes mellitus with stage 2 chronic kidney disease, without long-term current use of insulin ([place]) Discussed general issues about diabetes pathophysiology and management., Educational material distributed., Suggested low cholesterol diet., Encouraged aerobic exercise., Discussed foot care., retinal exam report requested -     Hemoglobin A1c  Hyperlipidemia associated with type 2 diabetes mellitus ([name]) check lipids, goal less than 70- discussed statin, stop zetia, plan to increase rosuvastatin 5 mg if needed for goal *** decrease fatty foods, increase activity.  -     Lipid panel -     ezetimibe (ZETIA) 10 MG tablet; Take 1 tablet (10 mg total) by mouth daily.  CKD stage 2 due to type 2 diabetes mellitus ([name]) Increase fluids, avoid NSAIDS, monitor sugars, will monitor - last trended down, recheck, ***  Microalbuminuria associated with T2DM ([name]) On max valsartan, control glucose, switch from [place] to SGLT-2i ***  Chronic gout involving toe without tophus, unspecified cause, unspecified laterality Gout- recheck Uric acid annually and as needed, Diet discussed, continue medications.  Nephrolithiasis Push fluids, monitor  Medication management -     Magnesium  Vitamin D deficiency Continue supplement  Hx of prostate cancer; Erectile dysfunction following radical prostatectomy Follow up urology; checking PSA annually here  Depression, in full remission ([name])/ anxiety  Denies sx; full remission  off of medication, monitor stress management techniques discussed, increase water, good sleep hygiene discussed, increase exercise, and increase veggies.   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    Continue diet and meds as discussed. Further disposition pending results of labs. Discussed med's effects and SE's.    Over 30 minutes of exam, counseling, chart review, and critical decision making was performed  Future Appointments  Date Time Provider [place]  09/15/2021 11:00 AM Liane Comber, NP [facility] None  09/22/2022 11:00 AM Darrol Jump, NP [facility] None     Plan:   During the course of the visit the patient was educated and counseled about appropriate screening and preventive services including:   Pneumococcal vaccine  Influenza vaccine Td vaccine Screening electrocardiogram Bone densitometry screening Colorectal cancer screening Diabetes screening Glaucoma screening Nutrition counseling  Advanced directives: requested   HPI 71 y.o.  AA male  presents for 3 month follow up on hypertension, cholesterol, diabetes and vitamin D deficiency and wellness visit. He has Essential hypertension; Hyperlipidemia associated with type 2 diabetes mellitus ([place]); Vitamin D deficiency; Nephrolithiasis; History of prostate cancer; T2_NIDDM w/Stage 2 CKD ([place]); Medication management; Erectile dysfunction following radical prostatectomy; Generalized anxiety disorder; Amputation of finger of left hand; CKD stage 2 due to type 2 diabetes mellitus ([place]); Obesity (BMI 30.0-34.9); Major depression in remission ([place]); Gout;  Pain in right shoulder; and Renal calculus, right on their  problem list.   Wife is linda, s/p kidney transplant, also taking care of her 69 y/o aunt with myeloma, she is stable/plateaued, some challenges.   He had nephrolithotomy Oct 2020 with Dr. Karsten Ro due to pain after it was postponed due to pandemic.  Hx of prostate cancer with prostatectomy in 2008, with ED. Follows urology *** Last PSA here was <0.04 in 05/2020 Lab Results  Component Value Date   PSA <0.04 05/22/2020   PSA <0.1 05/04/2019   PSA <0.1 04/14/2018   Depression in remission off of medications.   BMI is There is no height or weight on file to calculate BMI. he is working on diet and exercise. He has been prescribed phentermine but admits never picked up. He states he just needs to stop soda, bread and walk. He has been doing water aerobics.  Wt Readings from Last 3 Encounters:  01/12/21 194 lb (88 kg)  12/26/20 194 lb (88 kg)  12/10/20 194 lb 3.2 oz (88.1 kg)    His blood pressure has been controlled at home, today his BP is  .  He does workout. He denies chest pain, shortness of breath, dizziness.   He is on cholesterol medication (rosuvastatin 5 mg daily) and denies myalgias. His cholesterol is at goal of LDL <70. The cholesterol was:   Lab Results  Component Value Date   CHOL 125 12/10/2020   HDL 30 (L) 12/10/2020   LDLCALC 67 12/10/2020   TRIG 201 (H) 12/10/2020   CHOLHDL 4.2 12/10/2020    He has been working on diet and exercise for diabetes with diabetic chronic kidney disease. Taking metformin 500 mg three times daily.  Hyperlipidemia on rosuvastatin  he is on bASA denies  paresthesia of the feet, polydipsia, polyuria and visual disturbances.  He checks occasionally fasting, reports around 110s-120s - having trouble with meter, requests new Last A1C was:   Lab Results  Component Value Date   HGBA1C 6.7 (H) 12/10/2020   He has CKD II associated with T2DM on ACEi, with new eGFR calc at last check down to CKD III range ***. Last GFR:  Lab Results  Component  Value Date   EGFR 54 (L) 12/10/2020   Lab Results  Component Value Date   CREATININE 1.40 (H) 12/10/2020   CREATININE 1.17 09/11/2020   CREATININE 1.12 05/22/2020      Lab Results  Component Value Date   MICRALBCREAT 32 (H) 05/22/2020   MICRALBCREAT 34 (H) 05/04/2019   MICRALBCREAT 39 (H) 04/14/2018         Patient is on Vitamin D supplement. Lab Results  Component Value Date   VD25OH 83 12/10/2020   Patient is on allopurinol for gout and does not report a recent flare.  Lab Results  Component Value Date   LABURIC 4.5 12/10/2020     Current Medications:  Current Outpatient Medications on File Prior to Visit  Medication Sig Dispense Refill   allopurinol (ZYLOPRIM) 300 MG tablet Take 1 tablet Daily to Prevent Gout 90 tablet 3   aspirin 81 MG tablet Take 81 mg by mouth daily.     bisoprolol-hydrochlorothiazide (ZIAC) 5-6.25 MG tablet Take 1 tablet Daily for BP 90 tablet 3   blood glucose meter kit and supplies KIT Use to check blood sugar once daily 1 each 0   Blood Glucose Monitoring Suppl (FREESTYLE FREEDOM LITE) w/Device KIT Check blood sugar 1 time daily-DX-E11.22 (Patient not taking: Reported  on 01/12/2021) 1 kit 0   diclofenac Sodium (VOLTAREN) 1 % GEL Apply 1 application topically 4 (four) times daily.     fluticasone (FLONASE) 50 MCG/ACT nasal spray Place 1 spray into both nostrils daily as needed for allergies. 20 g 1   glucose blood (FREESTYLE LITE) test strip Check blood sugar 1 time daily-E11.22 (Patient not taking: Reported on 01/12/2021) 100 each 3   JANUVIA 100 MG tablet Take 100 mg by mouth daily.     Lancets (FREESTYLE) lancets Check blood sugar 1 time daily-E11.22 100 each 3   metFORMIN (GLUCOPHAGE) 500 MG tablet TAKE 1 TABLETS THREE TIMES A DAY WITH MEALS FOR DIABETES 270 tablet 3   Multiple Vitamins-Minerals (MULTIVITAMIN WITH MINERALS) tablet Take 1 tablet by mouth daily.     mupirocin ointment (BACTROBAN) 2 % Apply 1 application. topically 2 (two)  times daily. 22 g 0   Omega-3 Fatty Acids (FISH OIL) 1000 MG CAPS Take 1,000 mg by mouth daily.     phentermine (ADIPEX-P) 37.5 MG tablet Take 1 tablet (37.5 mg total) by mouth daily before breakfast. (Patient not taking: No sig reported) 30 tablet 2   rosuvastatin (CRESTOR) 5 MG tablet TAKE 1 TABLET 3 NIGHTS EVERY WEEK FOR CHOLESTEROL 36 tablet 3   terbinafine (LAMISIL) 250 MG tablet Take 1 tablet Daily for Toenail Fungus (Patient not taking: No sig reported) 90 tablet 1   valsartan (DIOVAN) 320 MG tablet Take 1 tablet Daily for BP 90 tablet 3   VITAMIN D PO Take 5,000 Units by mouth daily.     Wheat Dextrin (BENEFIBER DRINK MIX PO) Take 1 Package by mouth daily.      No current facility-administered medications on file prior to visit.   Medical History:  Past Medical History:  Diagnosis Date   Allergy    Asthma    as a child   Cancer [facility])    prostate   Cataract    ED (erectile dysfunction) of organic origin    Gout    History of colon polyps    History of kidney stones    History of positive PPD    06/ 2006--  CXR NORMAL   History of prostate cancer    DEC 2008--  S/P  RADICAL PROSTATECTOMY   Hyperlipidemia    Hypertension    Hypogonadism male    Mixed hyperlipidemia    Nocturia    Sigmoid diverticulosis    MILD   Type 2 diabetes mellitus ([place])    type 2   Preventative care: Immunization History  Administered Date(s) Administered   DTaP 02/17/2002   Influenza Whole 01/08/2013   Influenza, High Dose Seasonal PF 02/26/2014, 12/24/2015, 03/21/2017, 04/14/2018, 12/27/2018, 01/29/2020   PFIZER(Purple Top)SARS-COV-2 Vaccination 05/26/2019, 06/16/2019, 01/29/2020, 08/19/2020   Pneumococcal Conjugate-13 07/23/2015   Pneumococcal Polysaccharide-23 02/18/1996, 01/03/2018   Tdap 06/29/2011   Zoster, Live 03/23/2011    Last colonoscopy: 10/28/2010 - Dr Deatra Ina - 76 yr f/u.  EGD 2012 DEXA 2011 CXR 2013- never smoked  Tdap 2013 Pneumonia 2019 Prevnar 13: 2017 Influenza  01/2020 Shingles 2012- check with insurance  covid 19: has had 3/3, pfizer - he doesn't have card, will send info  Names of Other Physician/Practitioners you currently use: 1. [facility] here for primary care 2. Dr Frederico Hamman, eye doctor, 2021, report requested, early glaucoma- getting new provider 3. Dr Milford Cage, dentist, last visit 2022    Allergies Allergies  Allergen Reactions   Cialis [Tadalafil] Other (See Comments)  Hot flashes   Fructose Nausea And Vomiting   Morphine And Related Itching    SURGICAL HISTORY He  has a past surgical history that includes Finger amputation (age 66); Robot assisted laparoscopic radical prostatectomy (Dec 2008); Colonoscopy (N/A, last one 10-28-2010); Percutaneous nephrolithotripsy (Left, 04-04-2009// second look 04-15-2009); Ureterolithotomy (2002); Shoulder open rotator cuff repair (Right, 05-11-2006); Achilles tendon repair (Right, 1989); NEGATIVE SLEEP STUDY (2010  per pt); Penile prosthesis implant (N/A, 10/28/2014); IR URETERAL STENT RIGHT NEW ACCESS W/O SEP NEPHROSTOMY CATH (02/05/2019); Nephrolithotomy (Right, 02/05/2019); and Holmium laser application (Right, 45/62/5638). FAMILY HISTORY His family history includes Alcohol abuse in his father; Arthritis in his mother; Diabetes in his mother; Heart disease in his mother; Hypertension in his father. SOCIAL HISTORY He  reports that he has never smoked. He has never used smokeless tobacco. He reports that he does not drink alcohol and does not use drugs.  MEDICARE WELLNESS OBJECTIVES: Physical activity:   Cardiac risk factors:   Depression/mood screen:      09/11/2020   12:31 PM  Depression screen PHQ 2/9  Decreased Interest 0  Down, Depressed, Hopeless 0  PHQ - 2 Score 0  Altered sleeping 0  Tired, decreased energy 0  Change in appetite 0  Feeling bad or failure about yourself  0  Trouble concentrating 0  Moving slowly or fidgety/restless 0   Suicidal thoughts 0  PHQ-9 Score 0  Difficult doing work/chores Not difficult at all    ADLs:      View : No data to display.            Cognitive Testing  Alert? Yes  Normal Appearance?Yes  Oriented to person? Yes  Place? Yes   Time? Yes  Recall of three objects?  Yes  Can perform simple calculations? Yes  Displays appropriate judgment?Yes  Can read the correct time from a watch face?Yes  EOL planning:    Review of Systems:  Review of Systems  Constitutional:  Negative for malaise/fatigue and weight loss.  HENT:  Negative for hearing loss and tinnitus.   Eyes:  Negative for blurred vision and double vision.  Respiratory:  Negative for cough, sputum production, shortness of breath and wheezing.   Cardiovascular:  Negative for chest pain, palpitations, orthopnea, claudication, leg swelling and PND.  Gastrointestinal:  Negative for abdominal pain, blood in stool, constipation, diarrhea, heartburn, melena, nausea and vomiting.  Genitourinary: Negative.   Musculoskeletal:  Negative for falls, joint pain and myalgias.  Skin:  Negative for rash.  Neurological:  Negative for dizziness, tingling, sensory change, weakness and headaches.  Endo/Heme/Allergies:  Negative for polydipsia.  Psychiatric/Behavioral: Negative.  Negative for depression, memory loss, substance abuse and suicidal ideas. The patient is not nervous/anxious and does not have insomnia.   All other systems reviewed and are negative. Physical Exam: There were no vitals taken for this visit. Wt Readings from Last 3 Encounters:  01/12/21 194 lb (88 kg)  12/26/20 194 lb (88 kg)  12/10/20 194 lb 3.2 oz (88.1 kg)   General Appearance: Well nourished, in no apparent distress. Eyes: PERRLA, EOMs, conjunctiva no swelling or erythema Sinuses: No Frontal/maxillary tenderness ENT/Mouth: Ext aud canals clear, TMs without erythema, bulging. No erythema, swelling, or exudate on post pharynx.  Tonsils not swollen or  erythematous. Hearing decreased but has hearing aids Neck: Supple, thyroid normal.  Respiratory: Respiratory effort normal, BS equal bilaterally without rales, rhonchi, wheezing or stridor.  Cardio: RRR with no MRGs. Brisk peripheral pulses without edema.  Abdomen: Soft, +  BS.  Non tender, no guarding, rebound, hernias, masses. Lymphatics: Non tender without lymphadenopathy.  Musculoskeletal: Full ROM, 5/5 strength, Normal gait.   He has left hand 3rd digit well healed amputation at PIP joint.  Skin: Warm, dry without rashes, lesions, ecchymosis.  Neuro: Cranial nerves intact. No cerebellar symptoms.  Psych: Awake and oriented X 3, normal affect, Insight and Judgment appropriate.   Medicare Attestation I have personally reviewed: The patient's medical and social history Their use of alcohol, tobacco or illicit drugs Their current medications and supplements The patient's functional ability including ADLs,fall risks, home safety risks, cognitive, and hearing and visual impairment Diet and physical activities Evidence for depression or mood disorders  The patient's weight, height, BMI, and visual acuity have been recorded in the chart.  I have made referrals, counseling, and provided education to the patient based on review of the above and I have provided the patient with a written personalized care plan for preventive services.     Izora Ribas, NP 1:52 PM [facility]

## 2021-09-15 ENCOUNTER — Ambulatory Visit: Payer: Medicare PPO | Admitting: Adult Health

## 2021-09-15 DIAGNOSIS — E1169 Type 2 diabetes mellitus with other specified complication: Secondary | ICD-10-CM

## 2021-09-15 DIAGNOSIS — E559 Vitamin D deficiency, unspecified: Secondary | ICD-10-CM

## 2021-09-15 DIAGNOSIS — E1122 Type 2 diabetes mellitus with diabetic chronic kidney disease: Secondary | ICD-10-CM

## 2021-09-15 DIAGNOSIS — Z0000 Encounter for general adult medical examination without abnormal findings: Secondary | ICD-10-CM

## 2021-09-15 DIAGNOSIS — Z79899 Other long term (current) drug therapy: Secondary | ICD-10-CM

## 2021-09-15 DIAGNOSIS — M1A9XX0 Chronic gout, unspecified, without tophus (tophi): Secondary | ICD-10-CM

## 2021-09-15 DIAGNOSIS — I10 Essential (primary) hypertension: Secondary | ICD-10-CM

## 2021-09-15 DIAGNOSIS — N5231 Erectile dysfunction following radical prostatectomy: Secondary | ICD-10-CM

## 2021-09-15 DIAGNOSIS — F325 Major depressive disorder, single episode, in full remission: Secondary | ICD-10-CM

## 2021-09-15 DIAGNOSIS — R809 Proteinuria, unspecified: Secondary | ICD-10-CM

## 2021-09-15 DIAGNOSIS — F411 Generalized anxiety disorder: Secondary | ICD-10-CM

## 2021-09-15 DIAGNOSIS — E669 Obesity, unspecified: Secondary | ICD-10-CM

## 2021-09-15 DIAGNOSIS — Z8546 Personal history of malignant neoplasm of prostate: Secondary | ICD-10-CM

## 2022-09-22 ENCOUNTER — Ambulatory Visit: Payer: Medicare PPO | Admitting: Nurse Practitioner

## 2023-06-23 ENCOUNTER — Encounter: Payer: Self-pay | Admitting: *Deleted
